# Patient Record
Sex: Female | Born: 1970
Health system: Southern US, Community
[De-identification: ages and names within clinical notes are randomized; demographics above are authoritative.]

## PROBLEM LIST (undated history)

## (undated) DIAGNOSIS — M1990 Unspecified osteoarthritis, unspecified site: Secondary | ICD-10-CM

## (undated) DIAGNOSIS — I214 Non-ST elevation (NSTEMI) myocardial infarction: Secondary | ICD-10-CM

## (undated) DIAGNOSIS — I10 Essential (primary) hypertension: Secondary | ICD-10-CM

## (undated) DIAGNOSIS — M797 Fibromyalgia: Secondary | ICD-10-CM

## (undated) DIAGNOSIS — M549 Dorsalgia, unspecified: Secondary | ICD-10-CM

## (undated) DIAGNOSIS — I2510 Atherosclerotic heart disease of native coronary artery without angina pectoris: Secondary | ICD-10-CM

## (undated) DIAGNOSIS — Z8489 Family history of other specified conditions: Secondary | ICD-10-CM

## (undated) DIAGNOSIS — F419 Anxiety disorder, unspecified: Secondary | ICD-10-CM

## (undated) DIAGNOSIS — M542 Cervicalgia: Secondary | ICD-10-CM

## (undated) HISTORY — DX: Non-ST elevation (NSTEMI) myocardial infarction: I21.4

## (undated) HISTORY — DX: Dorsalgia, unspecified: M54.9

## (undated) HISTORY — DX: Essential (primary) hypertension: I10

## (undated) HISTORY — PX: APPENDECTOMY: SHX54

## (undated) HISTORY — DX: Cervicalgia: M54.2

## (undated) HISTORY — PX: CHOLECYSTECTOMY: SHX55

## (undated) HISTORY — PX: TUBAL LIGATION: SHX77

## (undated) HISTORY — PX: ABDOMINAL HYSTERECTOMY: SHX81

## (undated) HISTORY — DX: Fibromyalgia: M79.7

## (undated) HISTORY — PX: BREAST SURGERY: SHX581

## (undated) HISTORY — DX: Atherosclerotic heart disease of native coronary artery without angina pectoris: I25.10

---

## 2001-05-07 ENCOUNTER — Ambulatory Visit (HOSPITAL_COMMUNITY): Admission: AD | Admit: 2001-05-07 | Discharge: 2001-05-07 | Payer: Self-pay | Admitting: Obstetrics and Gynecology

## 2001-05-20 ENCOUNTER — Inpatient Hospital Stay (HOSPITAL_COMMUNITY): Admission: AD | Admit: 2001-05-20 | Discharge: 2001-05-22 | Payer: Self-pay | Admitting: Obstetrics and Gynecology

## 2001-07-25 ENCOUNTER — Ambulatory Visit (HOSPITAL_COMMUNITY): Admission: RE | Admit: 2001-07-25 | Discharge: 2001-07-25 | Payer: Self-pay | Admitting: Obstetrics and Gynecology

## 2001-07-26 ENCOUNTER — Encounter: Payer: Self-pay | Admitting: Obstetrics and Gynecology

## 2001-07-26 ENCOUNTER — Inpatient Hospital Stay (HOSPITAL_COMMUNITY): Admission: AD | Admit: 2001-07-26 | Discharge: 2001-07-27 | Payer: Self-pay | Admitting: Obstetrics and Gynecology

## 2001-07-31 ENCOUNTER — Ambulatory Visit (HOSPITAL_COMMUNITY): Admission: RE | Admit: 2001-07-31 | Discharge: 2001-07-31 | Payer: Self-pay | Admitting: Obstetrics and Gynecology

## 2001-07-31 ENCOUNTER — Encounter: Payer: Self-pay | Admitting: Obstetrics and Gynecology

## 2001-10-22 ENCOUNTER — Encounter: Payer: Self-pay | Admitting: Internal Medicine

## 2001-10-22 ENCOUNTER — Inpatient Hospital Stay (HOSPITAL_COMMUNITY): Admission: EM | Admit: 2001-10-22 | Discharge: 2001-10-24 | Payer: Self-pay | Admitting: Emergency Medicine

## 2003-07-21 ENCOUNTER — Ambulatory Visit (HOSPITAL_COMMUNITY): Admission: RE | Admit: 2003-07-21 | Discharge: 2003-07-21 | Payer: Self-pay | Admitting: Internal Medicine

## 2003-08-04 ENCOUNTER — Ambulatory Visit (HOSPITAL_COMMUNITY): Admission: RE | Admit: 2003-08-04 | Discharge: 2003-08-04 | Payer: Self-pay | Admitting: Internal Medicine

## 2003-12-21 ENCOUNTER — Emergency Department (HOSPITAL_COMMUNITY): Admission: EM | Admit: 2003-12-21 | Discharge: 2003-12-22 | Payer: Self-pay | Admitting: Emergency Medicine

## 2004-03-22 ENCOUNTER — Ambulatory Visit (HOSPITAL_COMMUNITY): Admission: RE | Admit: 2004-03-22 | Discharge: 2004-03-22 | Payer: Self-pay | Admitting: Internal Medicine

## 2004-03-22 ENCOUNTER — Emergency Department (HOSPITAL_COMMUNITY): Admission: EM | Admit: 2004-03-22 | Discharge: 2004-03-22 | Payer: Self-pay | Admitting: Emergency Medicine

## 2004-05-10 ENCOUNTER — Ambulatory Visit: Payer: Self-pay | Admitting: Psychology

## 2004-10-12 ENCOUNTER — Ambulatory Visit (HOSPITAL_COMMUNITY): Admission: RE | Admit: 2004-10-12 | Discharge: 2004-10-12 | Payer: Self-pay | Admitting: Obstetrics & Gynecology

## 2004-10-14 ENCOUNTER — Emergency Department (HOSPITAL_COMMUNITY): Admission: EM | Admit: 2004-10-14 | Discharge: 2004-10-14 | Payer: Self-pay | Admitting: Emergency Medicine

## 2005-02-14 ENCOUNTER — Ambulatory Visit (HOSPITAL_COMMUNITY): Admission: RE | Admit: 2005-02-14 | Discharge: 2005-02-14 | Payer: Self-pay | Admitting: Orthopedic Surgery

## 2005-02-14 ENCOUNTER — Ambulatory Visit (HOSPITAL_BASED_OUTPATIENT_CLINIC_OR_DEPARTMENT_OTHER): Admission: RE | Admit: 2005-02-14 | Discharge: 2005-02-14 | Payer: Self-pay | Admitting: Orthopedic Surgery

## 2005-03-13 ENCOUNTER — Encounter (HOSPITAL_COMMUNITY): Admission: RE | Admit: 2005-03-13 | Discharge: 2005-04-12 | Payer: Self-pay | Admitting: Orthopedic Surgery

## 2006-01-21 ENCOUNTER — Emergency Department (HOSPITAL_COMMUNITY): Admission: EM | Admit: 2006-01-21 | Discharge: 2006-01-21 | Payer: Self-pay | Admitting: Emergency Medicine

## 2007-04-11 ENCOUNTER — Ambulatory Visit (HOSPITAL_COMMUNITY): Admission: RE | Admit: 2007-04-11 | Discharge: 2007-04-11 | Payer: Self-pay | Admitting: Obstetrics and Gynecology

## 2009-02-25 ENCOUNTER — Other Ambulatory Visit: Admission: RE | Admit: 2009-02-25 | Discharge: 2009-02-25 | Payer: Self-pay | Admitting: Obstetrics and Gynecology

## 2009-02-25 ENCOUNTER — Emergency Department (HOSPITAL_COMMUNITY): Admission: EM | Admit: 2009-02-25 | Discharge: 2009-02-25 | Payer: Self-pay | Admitting: Emergency Medicine

## 2009-06-01 ENCOUNTER — Ambulatory Visit (HOSPITAL_COMMUNITY): Admission: RE | Admit: 2009-06-01 | Discharge: 2009-06-01 | Payer: Self-pay | Admitting: Obstetrics and Gynecology

## 2009-07-20 ENCOUNTER — Inpatient Hospital Stay (HOSPITAL_COMMUNITY): Admission: RE | Admit: 2009-07-20 | Discharge: 2009-07-22 | Payer: Self-pay | Admitting: Obstetrics and Gynecology

## 2009-07-20 ENCOUNTER — Encounter: Payer: Self-pay | Admitting: Obstetrics and Gynecology

## 2009-10-22 ENCOUNTER — Encounter (HOSPITAL_COMMUNITY): Admission: RE | Admit: 2009-10-22 | Discharge: 2009-11-21 | Payer: Self-pay | Admitting: Internal Medicine

## 2010-01-04 ENCOUNTER — Ambulatory Visit (HOSPITAL_COMMUNITY): Admission: RE | Admit: 2010-01-04 | Discharge: 2010-01-04 | Payer: Self-pay | Admitting: Internal Medicine

## 2010-08-15 ENCOUNTER — Observation Stay (HOSPITAL_COMMUNITY)
Admission: EM | Admit: 2010-08-15 | Discharge: 2010-08-16 | Payer: Self-pay | Source: Home / Self Care | Attending: Internal Medicine | Admitting: Internal Medicine

## 2010-08-23 ENCOUNTER — Encounter (HOSPITAL_COMMUNITY)
Admission: RE | Admit: 2010-08-23 | Discharge: 2010-09-22 | Payer: Self-pay | Source: Home / Self Care | Attending: Internal Medicine | Admitting: Internal Medicine

## 2010-10-05 ENCOUNTER — Ambulatory Visit (INDEPENDENT_AMBULATORY_CARE_PROVIDER_SITE_OTHER): Payer: Self-pay | Admitting: Internal Medicine

## 2010-10-05 ENCOUNTER — Ambulatory Visit: Admit: 2010-10-05 | Payer: Self-pay | Admitting: Internal Medicine

## 2010-11-14 LAB — CARDIAC PANEL(CRET KIN+CKTOT+MB+TROPI)
CK, MB: 1 ng/mL (ref 0.3–4.0)
Relative Index: INVALID (ref 0.0–2.5)
Total CK: 25 U/L (ref 7–177)
Troponin I: 0.01 ng/mL (ref 0.00–0.06)

## 2010-11-14 LAB — BASIC METABOLIC PANEL
BUN: 5 mg/dL — ABNORMAL LOW (ref 6–23)
CO2: 25 mEq/L (ref 19–32)
Calcium: 9.3 mg/dL (ref 8.4–10.5)
Creatinine, Ser: 0.85 mg/dL (ref 0.4–1.2)
Glucose, Bld: 75 mg/dL (ref 70–99)

## 2010-11-14 LAB — LIPID PANEL
Triglycerides: 479 mg/dL — ABNORMAL HIGH (ref <150)
VLDL: UNDETERMINED mg/dL (ref 0–40)

## 2010-11-14 LAB — D-DIMER, QUANTITATIVE: D-Dimer, Quant: 0.22 ug/mL-FEU (ref 0.00–0.48)

## 2010-11-14 LAB — CBC
MCH: 31.8 pg (ref 26.0–34.0)
MCHC: 37.6 g/dL — ABNORMAL HIGH (ref 30.0–36.0)
Platelets: 237 10*3/uL (ref 150–400)

## 2010-11-14 LAB — POCT CARDIAC MARKERS
CKMB, poc: <1 ng/mL — ABNORMAL LOW (ref 1.0–8.0)
Troponin i, poc: <0.05 ng/mL (ref 0.00–0.09)
Troponin i, poc: <0.05 ng/mL (ref 0.00–0.09)

## 2010-12-07 LAB — COMPREHENSIVE METABOLIC PANEL
AST: 49 U/L — ABNORMAL HIGH (ref 0–37)
Albumin: 4 g/dL (ref 3.5–5.2)
Alkaline Phosphatase: 52 U/L (ref 39–117)
Chloride: 108 mEq/L (ref 96–112)
GFR calc Af Amer: >60 mL/min (ref >60)
Potassium: 3.5 mEq/L (ref 3.5–5.1)
Sodium: 140 mEq/L (ref 135–145)
Total Bilirubin: 0.7 mg/dL (ref 0.3–1.2)

## 2010-12-07 LAB — DIFFERENTIAL
Basophils Absolute: 0 10*3/uL (ref 0.0–0.1)
Basophils Relative: 0 % (ref 0–1)
Eosinophils Absolute: 0.1 10*3/uL (ref 0.0–0.7)
Eosinophils Relative: 1 % (ref 0–5)
Lymphocytes Relative: 24 % (ref 12–46)

## 2010-12-07 LAB — CBC
HCT: 38.7 % (ref 36.0–46.0)
HCT: 45 % (ref 36.0–46.0)
Platelets: 206 10*3/uL (ref 150–400)
Platelets: 236 10*3/uL (ref 150–400)
RDW: 12.4 % (ref 11.5–15.5)
WBC: 5.9 10*3/uL (ref 4.0–10.5)

## 2010-12-12 LAB — POCT CARDIAC MARKERS
CKMB, poc: <1 ng/mL — ABNORMAL LOW (ref 1.0–8.0)
Myoglobin, poc: 49.8 ng/mL (ref 12–200)

## 2010-12-12 LAB — BASIC METABOLIC PANEL
CO2: 24 mEq/L (ref 19–32)
Calcium: 8.9 mg/dL (ref 8.4–10.5)
Creatinine, Ser: 0.84 mg/dL (ref 0.4–1.2)
GFR calc Af Amer: >60 mL/min (ref >60)
GFR calc non Af Amer: >60 mL/min (ref >60)
Sodium: 137 mEq/L (ref 135–145)

## 2011-01-20 NOTE — Op Note (Signed)
Dominique Hinton, Dominique Hinton NO.:  [id number]   MEDICAL RECORD NO.:  [id number]          PATIENT TYPE:  AMB   LOCATION:  DSC                          FACILITY:  [facility]   PHYSICIAN:  Cindee Salt, M.D.       DATE OF BIRTH:  [birth year]   DATE OF PROCEDURE:  02/14/2005  DATE OF DISCHARGE:                                 OPERATIVE REPORT   PREOPERATIVE DIAGNOSIS:  Ulnar neuropathy, left elbow.   POSTOPERATIVE DIAGNOSIS:  Ulnar neuropathy, left elbow.   OPERATION:  Decompression of subcutaneous transposition left ulnar nerve.   SURGEON:  Cindee Salt, M.D.   ASSISTANT:  R.N.   ANESTHESIA:  General.   DATE OF OPERATION:  February 14, 2005.   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  EMG nerve conduction is positive which has not  responded to conservative treatment.  She is brought in for subcutaneous  transposition.   PROCEDURE:  The patient is brought to the operating room where a general  anesthetic was carried out without difficulty.  She was prepped using  DuraPrep in supine position, left arm free.  A linear incision was made over  the medial epicondyle and carried down through subcutaneous tissue. Bleeders  were electrocauterized.  The posterior branch of the medial brachial  cutaneous nerve of the forearm was identified.  The dissection carried down  after protecting this nerve to the [facility] fascia.  Anconeus epitrochlearis  was present.  Significant compression was present to the nerve.  The nerve  was identified proximally and distally, and with blunt and sharp dissection  this was freed.  Significant scarring was present.  Epineurolysis was  performed.  The medial intermuscular septum was then transected proximally,  left attached to the medial epicondyle at its most medial aspect.  The  fascia of the flexor carpi ulnaris was released distally.  No bands were  found within the muscle distally.  A portion of  the origin was then elevated  and left attached to the medial epicondyle medially.  The nerve was then  anteriorly transposed with its vessels.  The two slings were then fashioned  and sutured into position with figure-of-eight 4-0 Vicryl sutures after  irrigation.  Subcutaneous tissue was closed with interrupted 4-0 Vicryl, the  skin with subcuticular 4-0 Monocryl sutures.  Steri-Strips were applied.  Sterile compressive dressing and long arm splint applied.  The patient  tolerated the procedure well and taken to the recovery room for observation  in satisfactory condition.  She is discharged home to return to the [facility] in one week on Vicodin.     GK/MEDQ  D:  02/14/2005  T:  02/14/2005  Job:  [id number]

## 2011-01-20 NOTE — Op Note (Signed)
[facility]  Patient:    Dominique Hinton, Dominique Hinton Visit Number: [id number] MRN: [id number]          Service Type: OUT Location: RAD Attending Physician:  Tilda Burrow Dictated by:   Christin Bach, M.D. Proc. Date: 09/22/01 Admit Date:  07/31/2001 Discharge Date: 07/31/2001                             Operative Report  PREOPERATIVE DIAGNOSES:  Elective sterilization.  POSTOPERATIVE DIAGNOSES:  Elective sterilization.  PROCEDURE:  Laparoscopic tubal sterilization with Falope rings.  SURGEON:  Christin Bach, M.D.  ASSISTANT:  None.  COMPLICATIONS:  None.  ESTIMATED BLOOD LOSS:  Minimal.  FINDINGS:  Adhesions in right lower quadrant status post childhood appendectomy, left intact.  DETAILS OF PROCEDURE:  The patient was taken to the operating room, prepped and draped for a combined abdominal and vaginal procedure, with Hulka tenaculum attached to the cervix for uterine manipulation. Bladder in-and-out catheterization. An infraumbilical, 1 cm vertical incision, as well as a transverse suprapubic 1 cm incision. Veress needle was used to introduce pneumoperitoneum through the umbilical incision with the pneumoperitoneum easily introduced under 10 mmHg of pressure. Introduction of the Veress needle was done, carefully elevating the abdominal wall and orienting the needle toward the pelvis.  The laparoscopic trocar was then carefully introduced into the abdomen using a similar technique, and the laparoscope was used to visualize normal pelvic anatomy with no evidence of bleeding or trauma. The suprapubic trocar was introduced under direct visualization, and then attention was directed to the left fallopian tube, which was identified up to its fimbriated end, elevated and a mid-segment loop of the tube was drawn up into the Falope ring applier, Marcaine 0.25% applied to the surface of the tube and the Falope ring applied, inspected, and found to be  in satisfactory position. The opposite tube was then treated in a similar fashion. The mesosalpinx beneath the Falope ring on each side was then infiltrated with approximately 3 cc of Marcaine 0.25%, using a transabdominal approach with a 22-gauge spinal needle.  Then, the laparoscopic equipment was removed after instilling 200 cc of saline into the abdomen and deflating the abdomen. Subcuticular 4-0 Dexon was used to close the skin incisions and Steri-Strips was placed on the skin surface. Sponge and needle counts were correct. The patient tolerated the procedure well, was awakened, and went to the recovery room in good condition. Dictated by:   Christin Bach, M.D. Attending Physician:  Tilda Burrow DD:  09/22/01 TD:  09/23/01 Job: [id number]

## 2011-01-20 NOTE — Discharge Summary (Signed)
[facility]  Patient:    Dominique, Hinton Visit Number: [id number] MRN: [id number]          Service Type: MED Location: 3A A336 01 Attending Physician:  Hilario Quarry Dictated by:   Franky Macho, M.D. Admit Date:  10/22/2001 Disc. Date: 10/24/01   CC:         Carylon Perches, M.D.   Discharge Summary  HOSPITAL COURSE SUMMARY:  The patient is a 40 year old white female who presented to the emergency room with right-sided abdominal pain.  The patient was noted to have cholecystitis secondary to cholelithiasis. The patient was admitted by Dr. Ouida Sills with a surgical consultation.  The patient subsequently underwent laparoscopic cholecystectomy on October 23, 2001.  She tolerated the procedure well.  Her postoperative course was remarkable only for mild to moderate incisional pain.  The patient is being discharged home on postoperative day #1 in good and improving condition.  DISCHARGE INSTRUCTIONS:  The patient is to follow up with Dr. Franky Macho on October 31, 2001.  DISCHARGE MEDICATIONS:  Vicodin 1-2 tablets p.o. q.4h. p.r.n. pain, dispense 40, no refills.  PRINCIPLE DIAGNOSES:  Cholecystitis, cholelithiasis.  PROCEDURE:  Laparoscopic cholecystectomy on October 23, 2001. Dictated by:   Franky Macho, M.D. Attending Physician:  Hilario Quarry DD:  10/24/01 TD:  10/24/01 Job: [id number]

## 2011-01-20 NOTE — Discharge Summary (Signed)
[facility]  Patient:    Dominique Hinton, Dominique Hinton Visit Number: [id number] MRN: [id number]          Service Type: MED Location: 4A A427 01 Attending Physician:  Tilda Burrow Dictated by:   Christin Bach, M.D. Admit Date:  07/26/2001 Discharge Date: 07/27/2001                             Discharge Summary  REASON FOR ADMISSION:  Postoperative abdominal pain.  DISCHARGE DIAGNOSIS:  Postoperative abdominal pain improved.  DISCHARGE MEDICATIONS: 1. Keflex 500 mg p.o. q.i.d. x 5 days. 2. Darvocet-N 100 1-2 q.4h. p.r.n. pain (Patient has prescription already).  FOLLOWUP:  Follow up in 72 hours in my office.  HISTORY OF PRESENT ILLNESS:  This 40 year old female 30 hours status post laparoscope tubal ligation with Falope rings was admitted after presenting to the office complaining of abdominal pain not relieved with hydrocodone with a single episode of vomiting shortly after the surgery but none during the day of admission with admission simply because of concerns over abdominal discomfort.  Laparoscopic tubal ligation was notable for straight forward procedure. Falope ring application was performed in a straight forward fashion and percutaneous installation of Marcaine into the encarcerated nuchal tube performed as per usual protocol as well as into the broad ligament below the Falope ring tube improved.  POSTOPERATIVE PAIN MANAGEMENT:  The suprapubic placement of the trocar was not felt to have impacted the bladder, as in and out catheterization was performed prior to the procedure itself.  The procedure was notable for some right lower quadrant adhesions at site of prior appendectomy which were partially lysed during the surgical procedure.  There was no time that we were operating close enough to the bowel that we suspected bowel injury or trauma.  PAST MEDICAL HISTORY:  Benign.  PAST SURGICAL HISTORY: 1. Appendectomy as a child for ruptured  appendix. 2. Tubal ligation, Falope rings July 25, 2001, as described earlier.  ALLERGIES:  No known drug allergies.  PHYSICAL EXAMINATION:  VITAL SIGNS:  Height 5 feet 6 inches, weight 179 pounds, blood pressure 130/88, pulse 82, respirations 20, temperature 100.8.  GENERAL:  A generally healthy, usually cheerful, Caucasian female in moderate discomfort both in the abdomen and through to the back.  HEENT:  Pupils, equal, round, reactive to light and accommodation. Extraocular movements intact.  NECK:  Supple.  Trachea midline.  CHEST:  Clear to auscultation.  ABDOMEN:  Well approximated recent surgical scars with 2+ abdominal pain to deep palpation.  No rebound tenderness.  PELVIC:  Deferred at this time.  LABORATORY DATA:  On admission includes a hemoglobin of 12, hematocrit 35, white count 6,100 with 48 neutrophils, no bands.  Electrolytes are notable for potassium of 3.1, BUN 6, creatinine 0.9.  HOSPITAL COURSE:  The patient was admitted and flat and upright of the abdomen showed no evidence of intra-abdominal air.  There was a normal bowel gas pattern with generous amounts of colonic gas present.  The patient was observed and received IV Toradol q.6h. x 2 along with Cefotan 2 grams initially and then 1 gram in 12 hours.  She was observed overnight, and remained afebrile with subsequent follow up WBC 5,500, 53 neutrophils, no bands.  Potassium was reverted to normal at 4.0 after potassium supplementation overnight.  The patient felt significantly better.  She did complain at the time of discharge of a residual pain occurring through to the back  at the time of voiding but otherwise was dramatically better having had 3 bowel movements, passage of gas, and abdominal examination shows active bowel sounds dramatically improved, with no guarding or rebound present.  DISPOSITION/FOLLOWUP:  The patient was felt stable for discharge home.  Follow up in 72 hours in our  office.  DISCHARGE MEDICATIONS:  As noted earlier. Dictated by:   Christin Bach, M.D. Attending Physician:  Tilda Burrow DD:  07/27/01 TD:  07/28/01 Job: [id number]

## 2011-01-20 NOTE — H&P (Signed)
[facility]  Patient:    Dominique Hinton, Dominique Hinton Visit Number: [id number] MRN: [id number]          Service Type: MED Location: 3A A336 01 Attending Physician:  Hilario Quarry Dictated by:   Carylon Perches, M.D. Admit Date:  10/22/2001                           History and Physical  CHIEF COMPLAINT:  Abdominal pain.  HISTORY OF PRESENT ILLNESS:  This patient is a 40 year old white female who presented to the office with a one day history of right-sided abdominal pain radiating into her back.  She had previously had an appendectomy and tubal ligation.  Her pain began last night and was quite severe.  She took hydrocodone without relief.  Her pain was not colicky in nature.  She did not experience any gross hematuria, vaginal bleeding, or diarrhea.  She was sent to the emergency room for further evaluation and treatment.  A urinalysis in the office was negative for blood.  Her CT scan in the ER revealed gallstones. Her LFTs and white count were normal.  PAST MEDICAL HISTORY: 1. Appendectomy. 2. Tubal ligation. 3. Left breast abscess. 4. Pyelonephritis.  MEDICATIONS:  None.  ALLERGIES:  None.  SOCIAL HISTORY:  She is a smoker.  She does not abuse alcohol.  She is presently unemployed.  FAMILY HISTORY:  Remarkable for hypertension.  REVIEW OF SYSTEMS:  No fever, chills, chest pain, vomiting, change in bowel habits, or difficulty voiding.  PHYSICAL EXAMINATION  VITAL SIGNS:  Temperature 98.6, pulse 84, respirations 24, blood pressure 114/104.  GENERAL:  Uncomfortable young white female.  HEENT:  No scleral icterus.  Pharynx:  Unremarkable.  NECK:  No thyromegaly.  LUNGS:  Clear.  HEART:  Regular with no murmurs.  ABDOMEN:  Tender in the right mid abdomen and right flank.  No hepatosplenomegaly or palpable mass.  EXTREMITIES:  No clubbing, cyanosis, edema.  NEUROLOGIC:  Grossly intact.  LABORATORY DATA:  Urinalysis is negative.  Beta hCG  less than 2.  White count 9.8, hematocrit 49, platelets 329,000.  Amylase 51.  Sodium 137, potassium 3.7, bicarbonate 24, glucose 105, BUN 7, creatinine 1.0, calcium 9.0, albumin 4.4, ALT 31.  CT scan of the abdomen reveals calcified gallstones and two tiny right renal calculi.  There is no hydronephrosis or ureteral dilatation.  IMPRESSION:  Cholecystitis.  She is being hospitalized for pain control, surgical consultation, and intravenous antibiotics.  She will likely require cholecystectomy.  This has been discussed with the patient and her family. Dictated by:   Carylon Perches, M.D. Attending Physician:  Hilario Quarry DD:  10/22/01 TD:  10/22/01 Job: [id number]

## 2011-01-20 NOTE — Op Note (Signed)
[facility]  Patient:    Dominique Hinton, Dominique Hinton Visit Number: [id number] MRN: [id number]          Service Type: MED Location: 3A A336 01 Attending Physician:  Hilario Quarry Dictated by:   Franky Macho, M.D. Proc. Date: 10/23/01 Admit Date:  10/22/2001   CC:         Carylon Perches, M.D.                           Operative Report  PREOPERATIVE DIAGNOSIS:  Cholecystitis, cholelithiasis.  POSTOPERATIVE DIAGNOSIS:  Cholecystitis, cholelithiasis.  PROCEDURE:  Laparoscopic cholecystectomy.  SURGEON:  Franky Macho, M.D.  ANESTHESIA:  General endotracheal anesthesia.  INDICATIONS:  The patient is a 40 year old white female who presents with cholecystitis secondary to cholelithiasis.  The risks and benefits of the procedure including bleeding, infection, hepatobiliary injury, and the possibility of a open procedure were fully explained to the patient, who gave informed consent.  DESCRIPTION OF PROCEDURE:  The patient was placed in the supine position. After induction of general endotracheal anesthesia, the abdomen was prepped and draped using the usual sterile technique with Betadine.  A subumbilical incision was made down to the fascia.  A Veress needle was introduced into the abdominal cavity and confirmation of placement was done using the saline drop test.  The abdomen was then insufflated to 16 mmHg pressure.  An 11 mm trocar was introduced into the abdominal cavity under direct visualization without difficulty.  The patient was placed in reverse Trendelenburg position and an additional 11 mm trocar was placed in the epigastric region and 5 mm trocar was placed in the right upper quadrant and right flank regions under direct visualization.  The liver was inspected and noted to be within normal limits.  The gallbladder was noted to be somewhat inflamed with adhesions noted around the infundibulum.  The cystic duct was first identified.  Its juncture to  the infundibulum fully identified and the clips were placed proximally and distally on the cystic duct and the cystic duct was divided.  This was likewise done with the cystic artery.  The gallbladder was then freed away from the gallbladder fossa using Bovie electrocautery.  The gallbladder was delivered through the epigastric trocar site using an endocatch bag.  Multiple stones were noted within the gallbladder lumen.  Gallbladder fossa was inspected and no abdominal bleeding or bile leakage was noted.  Surgicel was placed in the gallbladder fossa. Subhepatic space as well as right hepatic gutter were irrigated with normal saline.  All fluid was then evacuated from the abdominal cavity prior to removal of the trocars.  All wounds were irrigated with normal saline.  All wounds were injected with 0.5% Marcaine.  The subumbilical fascia as well as the epigastric fascia was reapproximated using an 0 Vicryl interrupted suture.  All skin incisions were closed using staples.  Betadine ointment and dry sterile dressings were applied.  All tape and needle counts correct at the end of the procedure.  The patient was extubated in the operating room and went back to the recovery room awake, in stable condition.  Complications - none.  Specimens were gallbladder with stones.  Blood loss was minimal. Dictated by:   Franky Macho, M.D. Attending Physician:  Hilario Quarry DD:  10/23/01 TD:  10/23/01 Job: [id number]

## 2011-01-20 NOTE — Consult Note (Signed)
[facility]  Patient:    Dominique Hinton, Dominique Hinton Visit Number: [id number] MRN: [id number]          Service Type: MED Location: 3A A336 01 Attending Physician:  Hilario Quarry Dictated by:   Franky Macho, M.D. Proc. Date: 10/22/01 Admit Date:  10/22/2001   CC:         Carylon Perches, M.D.   Consultation Report  REASON FOR CONSULTATION:  Cholecystitis, cholelithiasis.  REFERRING PHYSICIAN:  Carylon Perches, M.D.  HISTORY OF PRESENT ILLNESS:  Patient is a 40 year old white female who presents with a 24 hour history of worsening right flank and back pain.  She also complains of bloating, belching, and decreased appetite.  She denies any fever, chills, history of peptic ulcer disease, diarrhea, or constipation. Her last normal bowel movement was this morning.  She denies any hematochezia. She denies any jaundice.  PAST MEDICAL HISTORY:  Remarkable for urinary tract infection in the past.  PAST SURGICAL HISTORY:  Appendectomy in the remote past.  CURRENT MEDICATIONS:  Toradol, morphine.  ALLERGIES:  No known drug allergies.  REVIEW OF SYSTEMS:  Unremarkable.  PHYSICAL EXAMINATION  GENERAL:  Patient is a pleasant 40 year old white female who is lying on the cart in minimal distress.  VITAL SIGNS:  She is afebrile and vital signs are stable.  HEENT:  No scleral icterus.  LUNGS:  Clear to auscultation with equal breath sounds bilaterally.  HEART:  Regular rate and rhythm without S3, S4, or murmurs.  ABDOMEN:  Soft with tenderness noted along the right side of the abdomen, flank, and right back region.  No hepatosplenomegaly, masses, or hernias are identified.  RECTAL:  Not performed.  LABORATORIES:  CBC, chem-12, amylase, lipase are all within normal limits. Urinalysis is negative for hematuria.  CT scan of the abdomen and pelvis was performed which revealed calcified gallstones.  Two tiny right renal calculi are noted without ureteral dilatation or  hydronephrosis.  IMPRESSION:  Atypical cholecystitis, cholelithiasis.  PLAN:  Even though her symptoms are atypical for biliary colic, there is no other etiology for her symptoms.  Thus, one has to assume that this is due to cholelithiasis.  The risks and benefits of a laparoscopic cholecystectomy including bleeding, infection, hepatobiliary injury, the possibility of an open procedure were fully explained to the patient, gave informed consent. Patient will be brought into the hospital and the surgery will be performed tomorrow.  Thank you for consulting me on Dominique Hinton. Dictated by:   Franky Macho, M.D. Attending Physician:  Hilario Quarry DD:  10/22/01 TD:  10/22/01 Job: [id number]

## 2011-02-09 ENCOUNTER — Emergency Department (HOSPITAL_COMMUNITY)
Admission: EM | Admit: 2011-02-09 | Discharge: 2011-02-09 | Disposition: A | Discharge status: Home / Self Care | Payer: Self-pay | Attending: Emergency Medicine | Admitting: Emergency Medicine

## 2011-02-09 DIAGNOSIS — E7800 Pure hypercholesterolemia, unspecified: Secondary | ICD-10-CM | POA: Insufficient documentation

## 2011-02-09 DIAGNOSIS — R51 Headache: Secondary | ICD-10-CM | POA: Insufficient documentation

## 2011-06-17 ENCOUNTER — Emergency Department (HOSPITAL_COMMUNITY): Payer: Medicaid Other

## 2011-06-17 ENCOUNTER — Emergency Department (HOSPITAL_COMMUNITY)
Admission: EM | Admit: 2011-06-17 | Discharge: 2011-06-17 | Disposition: A | Discharge status: Home / Self Care | Payer: Medicaid Other | Attending: Emergency Medicine | Admitting: Emergency Medicine

## 2011-06-17 ENCOUNTER — Encounter: Payer: Self-pay | Admitting: *Deleted

## 2011-06-17 DIAGNOSIS — F17200 Nicotine dependence, unspecified, uncomplicated: Secondary | ICD-10-CM | POA: Insufficient documentation

## 2011-06-17 DIAGNOSIS — S42009A Fracture of unspecified part of unspecified clavicle, initial encounter for closed fracture: Secondary | ICD-10-CM | POA: Insufficient documentation

## 2011-06-17 DIAGNOSIS — W108XXA Fall (on) (from) other stairs and steps, initial encounter: Secondary | ICD-10-CM | POA: Insufficient documentation

## 2011-06-17 MED ORDER — OXYCODONE-ACETAMINOPHEN 5-325 MG PO TABS
2.0000 | ORAL_TABLET | Freq: Once | ORAL | Status: AC
  Administered 2011-06-17: 2 via ORAL
  Filled 2011-06-17: qty 2

## 2011-06-17 MED ORDER — HYDROMORPHONE HCL 2 MG PO TABS: 2.0000 mg | ORAL_TABLET | ORAL | Status: AC | PRN

## 2011-06-17 NOTE — ED Notes (Signed)
Pulses palp and sensory good in left hand. Pt continues to have pain after sling placed.

## 2011-06-17 NOTE — ED Provider Notes (Signed)
History     CSN: [id number] Arrival date & time: 06/17/2011 12:59 PM  Chief Complaint  Patient presents with  . Shoulder Pain   HPI Dominique Hinton is a 40 y.o. female who presents to the ED for left shoulder pain. She was going down steps and slipped and fell with all her weight going on the left shoulder. The pain is 10/10. The injury occurred just prior to arrival to the ED. The pain increases with any movement of the left upper extremity. The history was provided by the patient.   History reviewed. No pertinent past medical history.  Past Surgical History  Procedure Date  . Abdominal hysterectomy   . Tubal ligation   . Appendectomy   . Cholecystectomy   . Breast surgery     History reviewed. No pertinent family history.  History  Substance Use Topics  . Smoking status: Current Everyday Smoker -- 1.0 packs/day    Types: Cigarettes  . Smokeless tobacco: Not on file  . Alcohol Use: Yes     occasionally    OB History    Grav Para Term Preterm Abortions TAB SAB Ect Mult Living                  Review of Systems  Musculoskeletal:       Shoulder and clavicle pain.  All other systems reviewed and are negative.    Allergies  Cortisone and Doxycycline  Home Medications  No current outpatient prescriptions on file.  BP 140/86  Pulse 85  Temp(Src) 98.5 F (36.9 C) (Oral)  Resp 22  Ht 5\' 6"  (1.676 m)  Wt 185 lb (83.915 kg)  BMI 29.86 kg/m2  SpO2 98%  Physical Exam  Nursing note and vitals reviewed. Constitutional: She is oriented to person, place, and time. She appears well-developed and well-nourished.  HENT:  Head: Normocephalic and atraumatic.  Eyes: EOM are normal.  Neck: Normal range of motion. Neck supple.  Cardiovascular: Normal rate.   Pulmonary/Chest: Effort normal.  Musculoskeletal:       Tenderness of left shoulder on palpation, decreased range of motion due to pain. Deformity over left clavicle, tender with palpation. Radial pulse present,  no vascular compromise. Medial, ulnar and radial nerves intact. No decreased sensation.  Neurological: She is alert and oriented to person, place, and time. No cranial nerve deficit.  Skin: Skin is warm and dry.  Psychiatric: She has a normal mood and affect. Judgment normal.    ED Course  Procedures  Labs Reviewed - No data to display Dg Shoulder Left  06/17/2011  *RADIOLOGY REPORT*  Clinical Data: Fall.  Left shoulder pain.  LEFT SHOULDER - 2+ VIEW  Comparison: None.  Findings: Three-view exam shows a comminuted fracture of the mid clavicle.  There is a small fragment which is anteriorly displaced relative to the clavicle.  No evidence for shoulder separation or dislocation.  IMPRESSION: Comminuted mid clavicle fracture.  Original Report Authenticated By: ERIC A. MANSELL, M.D.    Dr. Fonnie Jarvis in to examine patient and patient sent back for clavicle films.  Assessment: Fractured clavicle comminuted    Plan:  Sling, ice and pain management    Follow up with ortho for possible surgical correction   MDM          Dominique Buffalo, NP 06/17/11 [id number]

## 2011-06-17 NOTE — ED Notes (Signed)
Pt a/ox4. Resp even and unlabored. NAD at this time. D/C instructions and Rx x 1 reviewed with pt. Pt verbalized understanding. Pt ambulated with steady gate to POV. Family to transport pt home.

## 2011-06-17 NOTE — ED Provider Notes (Signed)
The patient has normal light touch over the left deltoid region with normal sensory in the axillary nerve distribution, her left mid clavicle is tender without tinting of the skin, her distal neurovascular status is intact to her left hand with normal light touch and intact motor strength in the distributions of the radial, median, and ulnar nerve function to the left hand with capillary refill less than 2 seconds in all digits in her left hand. Her left hand wrist and elbow are all nontender in her left proximal humerus is also nontender his left clavicle tenderness only.  Hurman Horn, MD 06/17/11 [phone]

## 2011-06-17 NOTE — ED Notes (Signed)
Pt fell and landed on her left shoulder. States that she heard a pop. Able to wiggle fingers but states that she is unable to move her arm due to pain.

## 2011-06-22 NOTE — ED Provider Notes (Signed)
Medical screening examination/treatment/procedure(s) were performed by non-physician practitioner and as supervising physician I was immediately available for consultation/collaboration. Devoria Albe, MD, Armando Gang   Ward Givens, MD 06/22/11 [phone]

## 2012-10-20 ENCOUNTER — Emergency Department (HOSPITAL_COMMUNITY)
Admission: EM | Admit: 2012-10-20 | Discharge: 2012-10-20 | Disposition: A | Discharge status: Home / Self Care | Payer: Self-pay | Attending: Emergency Medicine | Admitting: Emergency Medicine

## 2012-10-20 ENCOUNTER — Encounter (HOSPITAL_COMMUNITY): Payer: Self-pay | Admitting: *Deleted

## 2012-10-20 DIAGNOSIS — F17200 Nicotine dependence, unspecified, uncomplicated: Secondary | ICD-10-CM | POA: Insufficient documentation

## 2012-10-20 DIAGNOSIS — J019 Acute sinusitis, unspecified: Secondary | ICD-10-CM | POA: Insufficient documentation

## 2012-10-20 DIAGNOSIS — J40 Bronchitis, not specified as acute or chronic: Secondary | ICD-10-CM | POA: Insufficient documentation

## 2012-10-20 DIAGNOSIS — H9201 Otalgia, right ear: Secondary | ICD-10-CM

## 2012-10-20 DIAGNOSIS — M542 Cervicalgia: Secondary | ICD-10-CM | POA: Insufficient documentation

## 2012-10-20 DIAGNOSIS — J329 Chronic sinusitis, unspecified: Secondary | ICD-10-CM

## 2012-10-20 DIAGNOSIS — R05 Cough: Secondary | ICD-10-CM | POA: Insufficient documentation

## 2012-10-20 DIAGNOSIS — R059 Cough, unspecified: Secondary | ICD-10-CM | POA: Insufficient documentation

## 2012-10-20 MED ORDER — ALBUTEROL SULFATE HFA 108 (90 BASE) MCG/ACT IN AERS
2.0000 | INHALATION_SPRAY | RESPIRATORY_TRACT | Status: DC | PRN
  Administered 2012-10-20: 2 via RESPIRATORY_TRACT
  Filled 2012-10-20: qty 6.7

## 2012-10-20 MED ORDER — ANTIPYRINE-BENZOCAINE 5.4-1.4 % OT SOLN: 3.0000 [drp] | OTIC | Status: DC | PRN

## 2012-10-20 MED ORDER — AMOXICILLIN 500 MG PO CAPS: 500.0000 mg | ORAL_CAPSULE | Freq: Three times a day (TID) | ORAL | Status: DC

## 2012-10-20 NOTE — ED Provider Notes (Signed)
History    This chart was scribed for Ward Givens, MD by Melba Coon, ED Scribe. The patient was seen in room APA11/APA11 and the patient's care was started at 8:56AM.    CSN: [id number]  Arrival date & time 10/20/12  2130   First MD Initiated Contact with Patient 10/20/12 [phone]      Chief Complaint  Patient presents with  . Otalgia  . Neck Pain  . Cough    (Consider location/radiation/quality/duration/timing/severity/associated sxs/prior treatment) The history is provided by the patient. No language interpreter was used.   Dominique Hinton is a 42 y.o. female who presents to the Emergency Department complaining of constant, moderate right otalgia without drainage that radiates to her right neck with associated nasal congestion and productive cough with an onset 5 days ago. She reports that she has had otalgia since around 08/28/12 but has gotten progressively worse since 5 days ago. She reports her ear feels like "it has cotton in it". Her cough produces whitish/yellow phlegm. She reports rhinorrhea that is clear when it is running but yellow when she has to blow her nose. She reports mild SOB and wheezing that is worse when she inhales more than she exhales since last month that is alleviated by her boyfriend's inhaler. She denies past history of asthma. She reports feeling hot alternating with heat intolerance yesterday, but denies documented fever. Denies rash, back pain, CP, abdominal pain, nausea, emesis, diarrhea, dysuria, or extremity pain, edema, weakness, numbness, or tingling. Allergic to doxycycline and cortisone. She takes Xanax and Maxalt as needed. No other pertinent medical symptoms. She is a current everyday smoker.  Her right ear and neck hurt more when she does a sniffing maneuver. She denies drainage from her ear and states she started having pain in her right ear 5 days ago with "cotton" in her right ear since Christmas.  PCP: Dr Ouida Sills  History reviewed. No  pertinent past medical history.  Past Surgical History  Procedure Laterality Date  . Abdominal hysterectomy    . Tubal ligation    . Appendectomy    . Cholecystectomy    . Breast surgery      No family history on file.  History  Substance Use Topics  . Smoking status: Current Every Day Smoker -- 1.00 packs/day    Types: Cigarettes  . Smokeless tobacco: Not on file  . 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  OB History   Grav Para Term Preterm Abortions TAB SAB Ect Mult Living                  Review of Systems  Constitutional: Positive for chills. Negative for fever.  HENT: Positive for ear pain, congestion, rhinorrhea and neck pain.   Respiratory: Positive for cough and shortness of breath.   Gastrointestinal: Negative for nausea, vomiting and diarrhea.   10 Systems reviewed and all are negative for acute change except as noted in the HPI.   Allergies  Cortisone and Doxycycline  Home Medications   Current Outpatient Rx  Name  Route  Sig  Dispense  Refill  . ALPRAZolam (XANAX) 0.5 MG tablet   Oral   Take 0.5 mg by mouth daily as needed. For stress. May take only a couple times per week            BP 138/93  Pulse 68  Temp(Src) 98.8 F (37.1 C) (Oral)  Resp 16  Ht 5\' 6"  (1.676 m)  Wt 185 lb (83.915 kg)  BMI 29.87 kg/m2  SpO2 95%  Vital signs normal    Physical Exam  Nursing note and vitals reviewed. Constitutional: She is oriented to person, place, and time. She appears well-developed and well-nourished.  Non-toxic appearance. She does not appear ill. No distress.  HENT:  Head: Normocephalic and atraumatic.  Right Ear: Tympanic membrane, external ear and ear canal normal.  Left Ear: Tympanic membrane, external ear and ear canal normal.  Nose: Mucosal edema present. No rhinorrhea. Right sinus exhibits maxillary sinus tenderness and frontal sinus tenderness.  Mouth/Throat: Oropharynx is clear and moist  and mucous membranes are normal. No dental abscesses or edematous.  Right nasal mucosa is boggy; left side is normal. Bilateral TMs normal.  Eyes: Conjunctivae and EOM are normal. Pupils are equal, round, and reactive to light.  Neck: Normal range of motion and full passive range of motion without pain. Neck supple.  Cardiovascular: Normal rate, regular rhythm and normal heart sounds.  Exam reveals no gallop and no friction rub.   No murmur heard. Pulmonary/Chest: Effort normal. No respiratory distress. She has no wheezes. She has rhonchi. She has no rales. She exhibits no tenderness and no crepitus.  Breath sounds coarse. Rare rhonchi.   Abdominal: Soft. Normal appearance and bowel sounds are normal. She exhibits no distension. There is no tenderness. There is no rebound and no guarding.  Musculoskeletal: Normal range of motion. She exhibits no edema and no tenderness.  Moves all extremities well.   Lymphadenopathy:    She has no cervical adenopathy.  Neurological: She is alert and oriented to person, place, and time. She has normal strength. No cranial nerve deficit.  Skin: Skin is warm, dry and intact. No rash noted. No erythema. No pallor.  Psychiatric: She has a normal mood and affect. Her speech is normal and behavior is normal. Her mood appears not anxious.    ED Course  Procedures (including critical care time)  Medications  albuterol (PROVENTIL HFA;VENTOLIN HFA) 108 (90 BASE) MCG/ACT inhaler 2 puff (not administered)     DIAGNOSTIC STUDIES: Oxygen Saturation is 95% on room air, adequate by my interpretation.    COORDINATION OF CARE:  9:01AM - albuterol inhlaer will be ordered for Zada Finders. 9:20PM -. She will be Rx amoxil and auralgan and she is ready for d/c.    1. Sinusitis   2. Bronchitis   3. Ear ache, right    New Prescriptions   AMOXICILLIN (AMOXIL) 500 MG CAPSULE    Take 1 capsule (500 mg total) by mouth 3 (three) times daily.   ANTIPYRINE-BENZOCAINE  (AURALGAN) OTIC SOLUTION    Place 3 drops into the right ear every 2 (two) hours as needed for pain.    Plan discharge  Devoria Albe, MD, FACEP    MDM   I personally performed the services described in this documentation, which was scribed in my presence. The recorded information has been reviewed and considered.  Devoria Albe, MD, FACEP       Ward Givens, MD 10/20/12 [phone]

## 2012-10-20 NOTE — ED Notes (Signed)
Discharge instructions reviewed with pt, questions answered. Pt verbalized understanding.  

## 2012-10-20 NOTE — ED Notes (Signed)
Right ear pain, described as feeling stopped up, since before christmas. Right neck pain, facial pain, productive cough, yellow in color for ~ 1 mo. Pt states she feels like she has an ear infection and sinus infection

## 2014-10-26 ENCOUNTER — Encounter (HOSPITAL_COMMUNITY): Payer: Self-pay | Admitting: Emergency Medicine

## 2014-10-26 ENCOUNTER — Emergency Department (HOSPITAL_COMMUNITY): Payer: Medicaid Other

## 2014-10-26 ENCOUNTER — Emergency Department (HOSPITAL_COMMUNITY)
Admission: EM | Admit: 2014-10-26 | Discharge: 2014-10-26 | Disposition: A | Discharge status: Home / Self Care | Payer: Medicaid Other | Attending: Emergency Medicine | Admitting: Emergency Medicine

## 2014-10-26 DIAGNOSIS — Z3202 Encounter for pregnancy test, result negative: Secondary | ICD-10-CM | POA: Insufficient documentation

## 2014-10-26 DIAGNOSIS — Z720 Tobacco use: Secondary | ICD-10-CM | POA: Insufficient documentation

## 2014-10-26 DIAGNOSIS — R110 Nausea: Secondary | ICD-10-CM | POA: Insufficient documentation

## 2014-10-26 DIAGNOSIS — F419 Anxiety disorder, unspecified: Secondary | ICD-10-CM | POA: Insufficient documentation

## 2014-10-26 DIAGNOSIS — R000 Tachycardia, unspecified: Secondary | ICD-10-CM | POA: Insufficient documentation

## 2014-10-26 DIAGNOSIS — R079 Chest pain, unspecified: Secondary | ICD-10-CM

## 2014-10-26 DIAGNOSIS — R062 Wheezing: Secondary | ICD-10-CM | POA: Insufficient documentation

## 2014-10-26 DIAGNOSIS — R0602 Shortness of breath: Secondary | ICD-10-CM | POA: Insufficient documentation

## 2014-10-26 DIAGNOSIS — Z79899 Other long term (current) drug therapy: Secondary | ICD-10-CM | POA: Insufficient documentation

## 2014-10-26 LAB — CBC
HCT: 48.4 % — ABNORMAL HIGH (ref 36.0–46.0)
Hemoglobin: 17.7 g/dL — ABNORMAL HIGH (ref 12.0–15.0)
MCH: 31.5 pg (ref 26.0–34.0)
MCHC: 36.6 g/dL — ABNORMAL HIGH (ref 30.0–36.0)
MCV: 86.1 fL (ref 78.0–100.0)
PLATELETS: 252 10*3/uL (ref 150–400)
RBC: 5.62 MIL/uL — ABNORMAL HIGH (ref 3.87–5.11)
RDW: 12.6 % (ref 11.5–15.5)
WBC: 8.9 10*3/uL (ref 4.0–10.5)

## 2014-10-26 LAB — URINALYSIS, ROUTINE W REFLEX MICROSCOPIC
Bilirubin Urine: NEGATIVE
Glucose, UA: NEGATIVE mg/dL
Ketones, ur: NEGATIVE mg/dL
Leukocytes, UA: NEGATIVE
Nitrite: NEGATIVE
PH: 6 (ref 5.0–8.0)
Protein, ur: NEGATIVE mg/dL
Specific Gravity, Urine: <1.005 — ABNORMAL LOW (ref 1.005–1.030)
UROBILINOGEN UA: 0.2 mg/dL (ref 0.0–1.0)

## 2014-10-26 LAB — PREGNANCY, URINE: Preg Test, Ur: NEGATIVE

## 2014-10-26 LAB — BASIC METABOLIC PANEL
Anion gap: 9 (ref 5–15)
BUN: 10 mg/dL (ref 6–23)
CO2: 20 mmol/L (ref 19–32)
Calcium: 10 mg/dL (ref 8.4–10.5)
Chloride: 111 mmol/L (ref 96–112)
Creatinine, Ser: 0.93 mg/dL (ref 0.50–1.10)
GFR calc Af Amer: 86 mL/min — ABNORMAL LOW (ref >90)
GFR, EST NON AFRICAN AMERICAN: 74 mL/min — AB (ref >90)
Glucose, Bld: 108 mg/dL — ABNORMAL HIGH (ref 70–99)
POTASSIUM: 3.4 mmol/L — AB (ref 3.5–5.1)
SODIUM: 140 mmol/L (ref 135–145)

## 2014-10-26 LAB — URINE MICROSCOPIC-ADD ON

## 2014-10-26 LAB — TROPONIN I

## 2014-10-26 LAB — D-DIMER, QUANTITATIVE (NOT AT ARMC)

## 2014-10-26 MED ORDER — NAPROXEN 500 MG PO TABS: 500.0000 mg | ORAL_TABLET | Freq: Two times a day (BID) | ORAL | Status: DC

## 2014-10-26 MED ORDER — SODIUM CHLORIDE 0.9 % IV BOLUS (SEPSIS)
1000.0000 mL | Freq: Once | INTRAVENOUS | Status: AC
  Administered 2014-10-26: 1000 mL via INTRAVENOUS

## 2014-10-26 MED ORDER — ONDANSETRON HCL 4 MG PO TABS: 4.0000 mg | ORAL_TABLET | Freq: Three times a day (TID) | ORAL | Status: DC | PRN

## 2014-10-26 MED ORDER — LORAZEPAM 2 MG/ML IJ SOLN
1.0000 mg | Freq: Once | INTRAMUSCULAR | Status: AC
  Administered 2014-10-26: 1 mg via INTRAVENOUS
  Filled 2014-10-26: qty 1

## 2014-10-26 MED ORDER — ALBUTEROL SULFATE (2.5 MG/3ML) 0.083% IN NEBU
5.0000 mg | INHALATION_SOLUTION | Freq: Once | RESPIRATORY_TRACT | Status: AC
  Administered 2014-10-26: 5 mg via RESPIRATORY_TRACT
  Filled 2014-10-26: qty 6

## 2014-10-26 NOTE — ED Provider Notes (Signed)
CSN: [id number]     Arrival date & time 10/26/14  6767 History  This chart was scribed for Dominique Acosta, MD by Stephania Fragmin, ED Scribe. This patient was seen in room APA02/APA02 and the patient's care was started at 9:31 AM.    Chief Complaint  Patient presents with  . Chest Pain   The history is provided by the patient. No language interpreter was used.     HPI Comments:  Dominique Hinton is a 44 y.o. female who presents to the Emergency Department complaining of SOB and small amount of central chest pain that both acutely began 1.5 hours ago. She reports she has been awake since 2:30 AM last night due to nausea. Her symptoms are severe and persistent; nothing makes them better or worse. She is breathing rapidly and states, "I don't know if I'm having a panic attack." She reports she has been having head congestion recently for which she has been taking OTC sinus medication (Advil Cold and Sinus), last taken yesterday morning. She has taken nothing else since. She denies new steess, medications, traveling, injuries, or surgeries. She denies a history of pulmonary or cardiac problems, including CVA. She denies dysuria, frequency, urine discoloration. Patient states she had vodka and 8 jello shooters at 6 pm last night while watching a race. She also smokes marijuana. She denies cocaine use.   No PE risk factors. Smoking is the only ACS risk factor.   History reviewed. No pertinent past medical history. Past Surgical History  Procedure Laterality Date  . Abdominal hysterectomy    . Tubal ligation    . Appendectomy    . Cholecystectomy    . Breast surgery     Family History  Problem Relation Age of Onset  . Diabetes Mother   . Alzheimer's disease Father   . Hypertension Other    History  Substance Use Topics  . Smoking status: Current Every Day Smoker -- 1.00 packs/day    Types: Cigarettes  . Smokeless tobacco: Not on file  . Alcohol Use: Yes     Comment: occasionally   OB  History    Gravida Para Term Preterm AB TAB SAB Ectopic Multiple Living   4 3 3  1  1         Review of Systems  Respiratory: Positive for shortness of breath.   Cardiovascular: Positive for chest pain.  Genitourinary: Negative for dysuria and frequency.  Psychiatric/Behavioral: The patient is nervous/anxious.      Allergies  Cortisone and Doxycycline  Home Medications   Prior to Admission medications   Medication Sig Start Date End Date Taking? Authorizing Provider  amoxicillin (AMOXIL) 500 MG capsule Take 1 capsule (500 mg total) by mouth 3 (three) times daily. Patient not taking: Reported on 10/26/2014 10/20/12   Janice Norrie, MD  antipyrine-benzocaine Toniann Fail) otic solution Place 3 drops into the right ear every 2 (two) hours as needed for pain. Patient not taking: Reported on 10/26/2014 10/20/12   Janice Norrie, MD  naproxen (NAPROSYN) 500 MG tablet Take 1 tablet (500 mg total) by mouth 2 (two) times daily with a meal. 10/26/14   Dominique Acosta, MD  ondansetron (ZOFRAN) 4 MG tablet Take 1 tablet (4 mg total) by mouth every 8 (eight) hours as needed for nausea or vomiting. 10/26/14   Dominique Acosta, MD   BP 162/98 mmHg  Pulse 87  Temp(Src) 98.9 F (37.2 C) (Oral)  Resp 22  Ht 5\' 6"  (1.676 m)  Wt 170 lb (77.111 kg)  BMI 27.45 kg/m2  SpO2 98% Physical Exam  Constitutional: She appears well-developed and well-nourished.  Anxious-appearing.  HENT:  Head: Normocephalic and atraumatic.  Mouth/Throat: Oropharynx is clear and moist. No oropharyngeal exudate.  Eyes: Conjunctivae and EOM are normal. Pupils are equal, round, and reactive to light. Right eye exhibits no discharge. Left eye exhibits no discharge. No scleral icterus.  Neck: Normal range of motion. Neck supple. No JVD present. No thyromegaly present.  Cardiovascular: Regular rhythm, normal heart sounds and intact distal pulses.  Tachycardia present.  Exam reveals no gallop and no friction rub.   No murmur  heard. Pulmonary/Chest: No respiratory distress. She has wheezes. She has no rales.  Tachypnea, wheezing.  Abdominal: Soft. Bowel sounds are normal. She exhibits no distension and no mass. There is no tenderness.  Musculoskeletal: Normal range of motion. She exhibits no edema or tenderness.  Lymphadenopathy:    She has no cervical adenopathy.  Neurological: She is alert. Coordination normal.  Skin: Skin is warm and dry. No rash noted. No erythema.  Psychiatric: She has a normal mood and affect. Her behavior is normal.  Nursing note and vitals reviewed.   ED Course  Procedures (including critical care time)  DIAGNOSTIC STUDIES: Oxygen Saturation is 100% on room air, normal by my interpretation.    COORDINATION OF CARE: 9:31 AM - Discussed treatment plan with pt at bedside which includes diagnostic tests and medication, and pt agreed to plan.   Labs Review Labs Reviewed  CBC - Abnormal; Notable for the following:    RBC 5.62 (*)    Hemoglobin 17.7 (*)    HCT 48.4 (*)    MCHC 36.6 (*)    All other components within normal limits  BASIC METABOLIC PANEL - Abnormal; Notable for the following:    Potassium 3.4 (*)    Glucose, Bld 108 (*)    GFR calc non Af Amer 74 (*)    GFR calc Af Amer 86 (*)    All other components within normal limits  URINALYSIS, ROUTINE W REFLEX MICROSCOPIC - Abnormal; Notable for the following:    Specific Gravity, Urine <1.005 (*)    Hgb urine dipstick TRACE (*)    All other components within normal limits  URINE MICROSCOPIC-ADD ON - Abnormal; Notable for the following:    Bacteria, UA MANY (*)    All other components within normal limits  D-DIMER, QUANTITATIVE  TROPONIN I  PREGNANCY, URINE    Imaging Review Dg Chest 2 View  10/26/2014   CLINICAL DATA:  44 year old female with new productive cough and congestion. Awoke at 0230 hours not feeling well. Shortness of breath and nausea. Initial encounter.  EXAM: CHEST  2 VIEW  COMPARISON:  08/15/2010 and  earlier.  FINDINGS: Normal cardiac size and mediastinal contours. Visualized tracheal air column is within normal limits. Lung volumes are stable and within normal limits. Stable mild increased interstitial markings, lung parenchyma otherwise stable and clear. No pneumothorax or pleural effusion. Chronic midshaft left clavicle deformity, new since 2010. No acute osseous abnormality identified.  IMPRESSION: No acute cardiopulmonary abnormality.   Electronically Signed   By: Genevie Ann M.D.   On: 10/26/2014 10:48     EKG Interpretation   Date/Time:  Monday October 26 2014 09:36:31 EST Ventricular Rate:  93 PR Interval:  153 QRS Duration: 97 QT Interval:  344 QTC Calculation: 428 R Axis:   73 Text Interpretation:  Sinus rhythm RSR' in V1 or V2, right  VCD or RVH  Since last tracing rate faster ECG OTHERWISE WITHIN NORMAL LIMITS  Confirmed by Mayanna Garlitz  MD, [place] ([id number]) on 10/26/2014 10:01:07 AM       11:50 AM-On re-evaluation, patient has a HR of 90. Informed of results. Agreeable to fluids for dehydration. Anticipate discharge.   MDM   Final diagnoses:  Chest pain    At this time the patient has been sleeping, tachycardia has resolved, labs show that the patient has a negative d-dimer, negative troponin , elevated hemoglobin level consistent with being dehydrated and normal metabolic panel. The patient has been informed of these results, she has been given IV fluids for some rehydration, she will be discharged home in a stable and improved condition.    Meds given in ED:  Medications  LORazepam (ATIVAN) injection 1 mg (1 mg Intravenous Given 10/26/14 0950)  albuterol (PROVENTIL) (2.5 MG/3ML) 0.083% nebulizer solution 5 mg (5 mg Nebulization Given 10/26/14 0947)  sodium chloride 0.9 % bolus 1,000 mL (1,000 mLs Intravenous New Bag/Given 10/26/14 1200)    New Prescriptions   NAPROXEN (NAPROSYN) 500 MG TABLET    Take 1 tablet (500 mg total) by mouth 2 (two) times daily with a meal.    ONDANSETRON (ZOFRAN) 4 MG TABLET    Take 1 tablet (4 mg total) by mouth every 8 (eight) hours as needed for nausea or vomiting.     I personally performed the services described in this documentation, which was scribed in my presence. The recorded information has been reviewed and is accurate.     Dominique Acosta, MD 10/26/14 [phone]

## 2014-10-26 NOTE — ED Notes (Signed)
Pt reports onset of CP and SOB this am.

## 2014-10-26 NOTE — Discharge Instructions (Signed)

## 2015-05-31 ENCOUNTER — Emergency Department (HOSPITAL_COMMUNITY)
Admission: EM | Admit: 2015-05-31 | Discharge: 2015-05-31 | Disposition: A | Discharge status: Home / Self Care | Payer: Self-pay | Attending: Emergency Medicine | Admitting: Emergency Medicine

## 2015-05-31 ENCOUNTER — Encounter (HOSPITAL_COMMUNITY): Payer: Self-pay | Admitting: Emergency Medicine

## 2015-05-31 DIAGNOSIS — Z720 Tobacco use: Secondary | ICD-10-CM | POA: Insufficient documentation

## 2015-05-31 DIAGNOSIS — G43809 Other migraine, not intractable, without status migrainosus: Secondary | ICD-10-CM | POA: Insufficient documentation

## 2015-05-31 MED ORDER — KETOROLAC TROMETHAMINE 30 MG/ML IJ SOLN
30.0000 mg | Freq: Once | INTRAMUSCULAR | Status: AC
  Administered 2015-05-31: 30 mg via INTRAVENOUS
  Filled 2015-05-31: qty 1

## 2015-05-31 MED ORDER — SODIUM CHLORIDE 0.9 % IV SOLN
Freq: Once | INTRAVENOUS | Status: AC
  Administered 2015-05-31: 10:00:00 via INTRAVENOUS

## 2015-05-31 MED ORDER — METOCLOPRAMIDE HCL 5 MG/ML IJ SOLN
10.0000 mg | Freq: Once | INTRAMUSCULAR | Status: AC
  Administered 2015-05-31: 10 mg via INTRAVENOUS
  Filled 2015-05-31: qty 2

## 2015-05-31 MED ORDER — DIPHENHYDRAMINE HCL 50 MG/ML IJ SOLN
25.0000 mg | Freq: Once | INTRAMUSCULAR | Status: AC
  Administered 2015-05-31: 25 mg via INTRAVENOUS
  Filled 2015-05-31: qty 1

## 2015-05-31 NOTE — ED Notes (Signed)
Pt c/o of migraine headache and nausea since Saturday. Denies vision changes. Pt has hx of migraines.

## 2015-05-31 NOTE — Discharge Instructions (Signed)

## 2015-05-31 NOTE — ED Provider Notes (Signed)
CSN: [id number]     Arrival date & time 05/31/15  6606 History   First MD Initiated Contact with Patient 05/31/15 0900     Chief Complaint  Patient presents with  . Migraine     (Consider location/radiation/quality/duration/timing/severity/associated sxs/prior Treatment) Patient is a 44 y.o. female presenting with migraines. The history is provided by the patient. No language interpreter was used.  Migraine This is a recurrent problem. The current episode started today. The problem occurs constantly. The problem has been gradually worsening. Associated symptoms include nausea. Pertinent negatives include no vomiting. Nothing aggravates the symptoms. She has tried nothing for the symptoms. The treatment provided moderate relief.  Pt has a history of migraines.   Pt complains of a migraine today  History reviewed. No pertinent past medical history. Past Surgical History  Procedure Laterality Date  . Abdominal hysterectomy    . Tubal ligation    . Appendectomy    . Cholecystectomy    . Breast surgery     Family History  Problem Relation Age of Onset  . Diabetes Mother   . Alzheimer's disease Father   . Hypertension Other    Social History  Substance Use Topics  . Smoking status: Current Every Day Smoker -- 1.00 packs/day    Types: Cigarettes  . Smokeless tobacco: None  . Alcohol Use: Yes     Comment: occasionally   OB History    Gravida Para Term Preterm AB TAB SAB Ectopic Multiple Living   4 3 3  1  1         Review of Systems  Gastrointestinal: Positive for nausea. Negative for vomiting.  All other systems reviewed and are negative.     Allergies  Cortisone and Doxycycline  Home Medications   Prior to Admission medications   Medication Sig Start Date End Date Taking? Authorizing Provider  ibuprofen (ADVIL,MOTRIN) 200 MG tablet Take 800 mg by mouth every 6 (six) hours as needed.   Yes Historical Provider, MD  amoxicillin (AMOXIL) 500 MG capsule Take 1 capsule  (500 mg total) by mouth 3 (three) times daily. Patient not taking: Reported on 10/26/2014 10/20/12   Rolland Porter, MD  antipyrine-benzocaine Toniann Fail) otic solution Place 3 drops into the right ear every 2 (two) hours as needed for pain. Patient not taking: Reported on 10/26/2014 10/20/12   Rolland Porter, MD  naproxen (NAPROSYN) 500 MG tablet Take 1 tablet (500 mg total) by mouth 2 (two) times daily with a meal. Patient not taking: Reported on 05/31/2015 10/26/14   Noemi Chapel, MD  ondansetron (ZOFRAN) 4 MG tablet Take 1 tablet (4 mg total) by mouth every 8 (eight) hours as needed for nausea or vomiting. Patient not taking: Reported on 05/31/2015 10/26/14   Noemi Chapel, MD   BP 127/80 mmHg  Pulse 67  Temp(Src) 98.2 F (36.8 C) (Oral)  Resp 18  Ht 5\' 6"  (1.676 m)  Wt 170 lb (77.111 kg)  BMI 27.45 kg/m2  SpO2 98% Physical Exam  Constitutional: She is oriented to person, place, and time. She appears well-developed and well-nourished.  HENT:  Head: Normocephalic.  Eyes: Conjunctivae and EOM are normal. Pupils are equal, round, and reactive to light.  Neck: Normal range of motion.  Cardiovascular: Normal rate and normal heart sounds.   Pulmonary/Chest: Effort normal.  Abdominal: She exhibits no distension.  Musculoskeletal: Normal range of motion.  Neurological: She is alert and oriented to person, place, and time.  Skin: Skin is warm.  Psychiatric: She has a  normal mood and affect.  Nursing note and vitals reviewed.   ED Course  Procedures (including critical care time) Labs Review Labs Reviewed - No data to display  Imaging Review No results found. I have personally reviewed and evaluated these images and lab results as part of my medical decision-making.   EKG Interpretation None      MDM  Pt given Iv ns x 1 liter.  Reglan, torodol and benadryl.   Pt reports relief from headache   Final diagnoses:  Other migraine without status migrainosus, not intractable    Pt advised to see  her MD for recheck    Fransico Meadow, PA-C 05/31/15 [place], MD 05/31/15 [phone]

## 2015-08-11 ENCOUNTER — Emergency Department (HOSPITAL_COMMUNITY): Payer: Self-pay

## 2015-08-11 ENCOUNTER — Emergency Department (HOSPITAL_COMMUNITY)
Admission: EM | Admit: 2015-08-11 | Discharge: 2015-08-11 | Disposition: A | Discharge status: Home / Self Care | Payer: Self-pay | Attending: Emergency Medicine | Admitting: Emergency Medicine

## 2015-08-11 ENCOUNTER — Encounter (HOSPITAL_COMMUNITY): Payer: Self-pay

## 2015-08-11 DIAGNOSIS — M79674 Pain in right toe(s): Secondary | ICD-10-CM | POA: Insufficient documentation

## 2015-08-11 DIAGNOSIS — F17210 Nicotine dependence, cigarettes, uncomplicated: Secondary | ICD-10-CM | POA: Insufficient documentation

## 2015-08-11 MED ORDER — DICLOFENAC SODIUM 50 MG PO TBEC: 50.0000 mg | DELAYED_RELEASE_TABLET | Freq: Two times a day (BID) | ORAL | Status: DC

## 2015-08-11 NOTE — ED Notes (Signed)
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  Reports pain in the joint of left great toe.

## 2015-08-11 NOTE — ED Provider Notes (Signed)
CSN: [id number]     Arrival date & time 08/11/15  1632 History   First MD Initiated Contact with Patient 08/11/15 1741     Chief Complaint  Patient presents with  . Foot Pain     (Consider location/radiation/quality/duration/timing/severity/associated sxs/prior Treatment) Patient is a 44 y.o. female presenting with lower extremity pain. The history is provided by the patient.  Foot Pain This is a new problem. The current episode started yesterday. The problem occurs constantly. The problem has been gradually worsening.   Dominique Hinton is a 44 y.o. 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  Pain is at the base of the great toe History reviewed. No pertinent past medical history. Past Surgical History  Procedure Laterality Date  . Abdominal hysterectomy    . Tubal ligation    . Appendectomy    . Cholecystectomy    . Breast surgery     Family History  Problem Relation Age of Onset  . Diabetes Mother   . Alzheimer's disease Father   . Hypertension Other    Social History  Substance Use Topics  . Smoking status: Current Every Day Smoker -- 1.00 packs/day    Types: Cigarettes  . Smokeless tobacco: None  . Alcohol Use: Yes     Comment: occasionally   OB History    Gravida Para Term Preterm AB TAB SAB Ectopic Multiple Living   4 3 3  1  1         Review of Systems  Musculoskeletal:       Pain of left great toe   All other systems negative   Allergies  Cortisone and Doxycycline  Home Medications   Prior to Admission medications   Medication Sig Start Date End Date Taking? Authorizing Provider  amoxicillin (AMOXIL) 500 MG capsule Take 1 capsule (500 mg total) by mouth 3 (three) times daily. Patient not taking: Reported on 10/26/2014 10/20/12   Rolland Porter, MD  antipyrine-benzocaine Toniann Fail) otic solution Place 3 drops into the right ear every 2 (two) hours as needed for pain. Patient not taking:  Reported on 10/26/2014 10/20/12   Rolland Porter, MD  diclofenac (VOLTAREN) 50 MG EC tablet Take 1 tablet (50 mg total) by mouth 2 (two) times daily. 08/11/15   Chane Cowden Bunnie Pion, NP  ibuprofen (ADVIL,MOTRIN) 200 MG tablet Take 800 mg by mouth every 6 (six) hours as needed.    Historical Provider, MD  ondansetron (ZOFRAN) 4 MG tablet Take 1 tablet (4 mg total) by mouth every 8 (eight) hours as needed for nausea or vomiting. Patient not taking: Reported on 05/31/2015 10/26/14   Noemi Chapel, MD   There were no vitals taken for this visit. Physical Exam  Constitutional: She is oriented to person, place, and time. She appears well-developed and well-nourished. No distress.  HENT:  Head: Normocephalic.  Eyes: EOM are normal.  Neck: Normal range of motion. Neck supple.  Cardiovascular: Normal rate.   Pulmonary/Chest: Effort normal.  Musculoskeletal: Normal range of motion.       Left foot: There is tenderness. There is normal capillary refill, no deformity and no laceration. Decreased range of motion: due to pain. Swelling: minimal swelling.       Feet:  Pedal pulses 2+, adequate circulation, good touch sensation. Pain with palpation at the base of the left great toe.   Neurological: She is alert and oriented to person, place, and time. No cranial nerve  deficit.  Skin: Skin is warm and dry.  Psychiatric: She has a normal mood and affect. Her behavior is normal.  Nursing note and vitals reviewed.   ED Course  Procedures (including critical care time) Labs Review Labs Reviewed - No data to display  Imaging Review Dg Toe Great Left  08/11/2015  CLINICAL DATA:  pain in left great toe EXAM: LEFT GREAT TOE COMPARISON:  None. FINDINGS: There is no evidence of fracture or dislocation. There is no evidence of arthropathy or other focal bone abnormality. Soft tissues are unremarkable. IMPRESSION: Negative. Electronically Signed   By: Kerby Moors M.D.   On: 08/11/2015 17:23    MDM  44 y.o. female with pain  at the base of the left great toe after wearing booth that rubbed her foot all day at work yesterday. Stable for d/c without focal neuro deficits. Ace wrap applied, NSAIDS. Discussed with the patient and all questioned fully answered. She will return if any problems arise.   Final diagnoses:  Pain of right great toe      Ashley Murrain, NP 08/11/15 1805  Varney Biles, MD 08/12/15 0003

## 2016-02-16 ENCOUNTER — Emergency Department (HOSPITAL_COMMUNITY): Payer: Self-pay

## 2016-02-16 ENCOUNTER — Encounter (HOSPITAL_COMMUNITY): Payer: Self-pay | Admitting: Emergency Medicine

## 2016-02-16 ENCOUNTER — Emergency Department (HOSPITAL_COMMUNITY)
Admission: EM | Admit: 2016-02-16 | Discharge: 2016-02-16 | Disposition: A | Discharge status: Home / Self Care | Payer: Self-pay | Attending: Emergency Medicine | Admitting: Emergency Medicine

## 2016-02-16 DIAGNOSIS — F17210 Nicotine dependence, cigarettes, uncomplicated: Secondary | ICD-10-CM | POA: Insufficient documentation

## 2016-02-16 DIAGNOSIS — Y939 Activity, unspecified: Secondary | ICD-10-CM | POA: Insufficient documentation

## 2016-02-16 DIAGNOSIS — S60042A Contusion of left ring finger without damage to nail, initial encounter: Secondary | ICD-10-CM

## 2016-02-16 DIAGNOSIS — Y929 Unspecified place or not applicable: Secondary | ICD-10-CM | POA: Insufficient documentation

## 2016-02-16 DIAGNOSIS — Y999 Unspecified external cause status: Secondary | ICD-10-CM | POA: Insufficient documentation

## 2016-02-16 DIAGNOSIS — W228XXA Striking against or struck by other objects, initial encounter: Secondary | ICD-10-CM | POA: Insufficient documentation

## 2016-02-16 DIAGNOSIS — T1490XA Injury, unspecified, initial encounter: Secondary | ICD-10-CM

## 2016-02-16 MED ORDER — IBUPROFEN 400 MG PO TABS
600.0000 mg | ORAL_TABLET | Freq: Once | ORAL | Status: AC
  Administered 2016-02-16: 600 mg via ORAL
  Filled 2016-02-16: qty 2

## 2016-02-16 NOTE — Discharge Instructions (Signed)
Use ice and elevate every 2-3 hrs while awake. Wear splint until you see a physician. Ibuprofen 600 mg and tylenol 1000 mg every 6 hrs for pain.   If you were given medicines take as directed.  If you are on coumadin or contraceptives realize their levels and effectiveness is altered by many different medicines.  If you have any reaction (rash, tongues swelling, other) to the medicines stop taking and see a physician.    If your blood pressure was elevated in the ER make sure you follow up for management with a primary doctor or return for chest pain, shortness of breath or stroke symptoms.  Please follow up as directed and return to the ER or see a physician for new or worsening symptoms.  Thank you. Filed Vitals:   02/16/16 0656  BP: 126/102  Pulse: 88  Temp: 98.5 F (36.9 C)  TempSrc: Oral  Resp: 18  Height: 5\' 6"  (1.676 m)  Weight: 170 lb (77.111 kg)  SpO2: 99%

## 2016-02-16 NOTE — ED Notes (Signed)
Pt reports left ring finger pain since yesterday. Pt reports was trying to block being hit by a pillow and reports left ring finger swelling and decreased ROM ever since. Distal pulses moderate.

## 2016-02-16 NOTE — ED Provider Notes (Signed)
CSN: [id number]     Arrival date & time 02/16/16  [id number] History   First MD Initiated Contact with Patient 02/16/16 0703     Chief Complaint  Patient presents with  . Finger Injury     (Consider location/radiation/quality/duration/timing/severity/associated sxs/prior Treatment) HPI Comments: 45 yo female with smoking hx, no significant hx presents with left ring finger injury.  atient was trying toblock being hit by a pillow and injured her left ring finger. Patient is swelling and pain with range of motion. No other injuries.  The history is provided by the patient.    History reviewed. No pertinent past medical history. Past Surgical History  Procedure Laterality Date  . Abdominal hysterectomy    . Tubal ligation    . Appendectomy    . Cholecystectomy    . Breast surgery     Family History  Problem Relation Age of Onset  . Diabetes Mother   . Alzheimer's disease Father   . Hypertension Other    Social History  Substance Use Topics  . Smoking status: Current Every Day Smoker -- 1.00 packs/day    Types: Cigarettes  . Smokeless tobacco: None  . Alcohol Use: Yes     Comment: occasionally   OB History    Gravida Para Term Preterm AB TAB SAB Ectopic Multiple Living   4 3 3  1  1         Review of Systems    Allergies  Cortisone and Doxycycline  Home Medications   Prior to Admission medications   Medication Sig Start Date End Date Taking? Authorizing Provider  amoxicillin (AMOXIL) 500 MG capsule Take 1 capsule (500 mg total) by mouth 3 (three) times daily. Patient not taking: Reported on 10/26/2014 10/20/12   Rolland Porter, MD  antipyrine-benzocaine Toniann Fail) otic solution Place 3 drops into the right ear every 2 (two) hours as needed for pain. Patient not taking: Reported on 10/26/2014 10/20/12   Rolland Porter, MD  diclofenac (VOLTAREN) 50 MG EC tablet Take 1 tablet (50 mg total) by mouth 2 (two) times daily. 08/11/15   Hope Bunnie Pion, NP  ibuprofen (ADVIL,MOTRIN) 200 MG tablet  Take 800 mg by mouth every 6 (six) hours as needed.    Historical Provider, MD  ondansetron (ZOFRAN) 4 MG tablet Take 1 tablet (4 mg total) by mouth every 8 (eight) hours as needed for nausea or vomiting. Patient not taking: Reported on 05/31/2015 10/26/14   Noemi Chapel, MD   BP 126/102 mmHg  Pulse 88  Temp(Src) 98.5 F (36.9 C) (Oral)  Resp 18  Ht 5\' 6"  (1.676 m)  Wt 170 lb (77.111 kg)  BMI 27.45 kg/m2  SpO2 99% Physical Exam  Constitutional: She is oriented to person, place, and time. She appears well-developed and well-nourished.  HENT:  Head: Normocephalic and atraumatic.  Eyes: Right eye exhibits no discharge. Left eye exhibits no discharge.  Neck: Normal range of motion. Neck supple. No tracheal deviation present.  Cardiovascular: Normal rate.   Pulmonary/Chest: Effort normal.  Abdominal: Soft. She exhibits no distension. There is no tenderness. There is no guarding.  Musculoskeletal: She exhibits edema and tenderness.  Tender PIP and DIP with swelling of ring finger on left, no open wounds. Pt can flex and extend in isolation at each joint, difficulty with assessing full strength due to edema  Neurological: She is alert and oriented to person, place, and time.  Skin: Skin is warm. No rash noted.  Psychiatric: She has a normal mood and affect.  Nursing note and vitals reviewed.   ED Course  Procedures (including critical care time) Labs Review Labs Reviewed - No data to display  Imaging Review Dg Finger Ring Left  02/16/2016  CLINICAL DATA:  LEFT ring finger pain since yesterday. EXAM: LEFT RING FINGER 2+V COMPARISON:  None. FINDINGS: There is no evidence of fracture or dislocation. There is no evidence of erosive arthropathy. Soft tissue swelling. Chronic osseous spurring from the middle phalanx ventrally at the PIP joint. IMPRESSION: Negative for fracture.  Soft tissue swelling. Electronically Signed   By: Staci Righter M.D.   On: 02/16/2016 07:23   I have personally  reviewed and evaluated these images and lab results as part of my medical decision-making.   EKG Interpretation None      MDM   Final diagnoses:  Injury  Contusion of left ring finger, initial encounter   Patient presents with isolated finger contusion. X-ray reviewed with patient no fracture mild spurring. Discussed possible tendon injury finger splint and outpatient follow up with orthopedics.  Results and differential diagnosis were discussed with the patient/parent/guardian. Xrays were independently reviewed by myself.  Close follow up outpatient was discussed, comfortable with the plan.   Medications  ibuprofen (ADVIL,MOTRIN) tablet 600 mg (600 mg Oral Given 02/16/16 0736)    Filed Vitals:   02/16/16 0656  BP: 126/102  Pulse: 88  Temp: 98.5 F (36.9 C)  TempSrc: Oral  Resp: 18  Height: 5\' 6"  (1.676 m)  Weight: 170 lb (77.111 kg)  SpO2: 99%    Final diagnoses:  Injury  Contusion of left ring finger, initial encounter        Elnora Morrison, MD 02/16/16 [phone]

## 2016-10-21 ENCOUNTER — Emergency Department (HOSPITAL_COMMUNITY): Payer: Self-pay

## 2016-10-21 ENCOUNTER — Encounter (HOSPITAL_COMMUNITY): Payer: Self-pay | Admitting: Emergency Medicine

## 2016-10-21 ENCOUNTER — Emergency Department (HOSPITAL_COMMUNITY)
Admission: EM | Admit: 2016-10-21 | Discharge: 2016-10-21 | Disposition: A | Discharge status: Home / Self Care | Payer: Self-pay | Attending: Emergency Medicine | Admitting: Emergency Medicine

## 2016-10-21 DIAGNOSIS — Y929 Unspecified place or not applicable: Secondary | ICD-10-CM | POA: Insufficient documentation

## 2016-10-21 DIAGNOSIS — Y9389 Activity, other specified: Secondary | ICD-10-CM | POA: Insufficient documentation

## 2016-10-21 DIAGNOSIS — Z79899 Other long term (current) drug therapy: Secondary | ICD-10-CM | POA: Insufficient documentation

## 2016-10-21 DIAGNOSIS — Y999 Unspecified external cause status: Secondary | ICD-10-CM | POA: Insufficient documentation

## 2016-10-21 DIAGNOSIS — X501XXA Overexertion from prolonged static or awkward postures, initial encounter: Secondary | ICD-10-CM | POA: Insufficient documentation

## 2016-10-21 DIAGNOSIS — F17210 Nicotine dependence, cigarettes, uncomplicated: Secondary | ICD-10-CM | POA: Insufficient documentation

## 2016-10-21 DIAGNOSIS — S93402A Sprain of unspecified ligament of left ankle, initial encounter: Secondary | ICD-10-CM | POA: Insufficient documentation

## 2016-10-21 MED ORDER — HYDROCODONE-ACETAMINOPHEN 5-325 MG PO TABS: ORAL_TABLET | ORAL | 0 refills | Status: DC

## 2016-10-21 NOTE — ED Triage Notes (Signed)
Patient c/o left ankle/foot pain and swelling that has been intermittent since last Thursday. Patient states stepped in hole and twisted ankle.

## 2016-10-21 NOTE — ED Provider Notes (Signed)
[facility] DEPT Provider Note   CSN: [id number] Arrival date & time: 10/21/16  1417     History   Chief Complaint Chief Complaint  Patient presents with  . Ankle Pain    HPI Dominique Hinton is a 46 y.o. female.  HPI  Dominique Hinton is a 46 y.o. female who presents to the Emergency Department complaining of left ankle pain and swelling for two days.  She reports a twisting injury to her ankle after stepping in a hole.  She describes an intermittent pain to ankle initially, she wore an OTC ankle brace for one day and continued to stand and work.  Pain became worse and has been waxing and waning since then and associated with weight bearing. She denies numbness, calf pain, discoloration or other injuries.     History reviewed. No pertinent past medical history.  There are no active problems to display for this patient.   Past Surgical History:  Procedure Laterality Date  . ABDOMINAL HYSTERECTOMY    . APPENDECTOMY    . BREAST SURGERY    . CHOLECYSTECTOMY    . TUBAL LIGATION      OB History    Gravida Para Term Preterm AB Living   4 3 3   1      SAB TAB Ectopic Multiple Live Births   1               Home Medications    Prior to Admission medications   Medication Sig Start Date End Date Taking? Authorizing Provider  amoxicillin (AMOXIL) 500 MG capsule Take 1 capsule (500 mg total) by mouth 3 (three) times daily. Patient not taking: Reported on 10/26/2014 10/20/12   Rolland Porter, MD  antipyrine-benzocaine Toniann Fail) otic solution Place 3 drops into the right ear every 2 (two) hours as needed for pain. Patient not taking: Reported on 10/26/2014 10/20/12   Rolland Porter, MD  diclofenac (VOLTAREN) 50 MG EC tablet Take 1 tablet (50 mg total) by mouth 2 (two) times daily. 08/11/15   Hope Bunnie Pion, NP  HYDROcodone-acetaminophen (NORCO/VICODIN) 5-325 MG tablet Take one tab po q 4-6 hrs prn pain 10/21/16   Onis Markoff, PA-C  ibuprofen (ADVIL,MOTRIN) 200 MG tablet Take 800 mg by  mouth every 6 (six) hours as needed.    Historical Provider, MD  ondansetron (ZOFRAN) 4 MG tablet Take 1 tablet (4 mg total) by mouth every 8 (eight) hours as needed for nausea or vomiting. Patient not taking: Reported on 05/31/2015 10/26/14   Noemi Chapel, MD    Family History Family History  Problem Relation Age of Onset  . Diabetes Mother   . Alzheimer's disease Father   . Hypertension Other     Social History Social History  Substance Use Topics  . Smoking status: Current Every Day Smoker    Packs/day: 1.00    Types: Cigarettes  . Smokeless tobacco: Never Used  . Alcohol use Yes     Comment: occasionally     Allergies   Cortisone and Doxycycline   Review of Systems Review of Systems  Constitutional: Negative for chills and fever.  Gastrointestinal: Negative for nausea and vomiting.  Musculoskeletal: Positive for arthralgias and joint swelling. Negative for back pain and neck pain.  Skin: Negative for color change and wound.  Neurological: Negative for weakness and numbness.  All other systems reviewed and are negative.    Physical Exam Updated Vital Signs BP (!) 153/102 Comment: BP repeated   Pulse 83   Temp  99.3 F (37.4 C) (Oral)   Resp 18   Ht 5\' 6"  (1.676 m)   Wt 86.2 kg   SpO2 97%   BMI 30.67 kg/m   Physical Exam  Constitutional: She is oriented to person, place, and time. She appears well-developed and well-nourished. No distress.  HENT:  Head: Normocephalic and atraumatic.  Neck: Normal range of motion.  Cardiovascular: Normal rate, regular rhythm, normal heart sounds and intact distal pulses.   Pulmonary/Chest: Effort normal and breath sounds normal.  Musculoskeletal: She exhibits edema and tenderness.  Diffuse ttp of the medial and lateral left ankle.  Mild edema at the lateral malleolus with slight bruising also present.  Achilles tendon appears intact.  DP pulse is brisk, distal sensation intact.  No erythema, abrasion,  or bony deformity.  No  proximal tenderness.  Neurological: She is alert and oriented to person, place, and time. She exhibits normal muscle tone. Coordination normal.  Skin: Skin is warm and dry.  Nursing note and vitals reviewed.    ED Treatments / Results  Labs (all labs ordered are listed, but only abnormal results are displayed) Labs Reviewed - No data to display  EKG  EKG Interpretation None       Radiology Dg Ankle Complete Left  Result Date: 10/21/2016 CLINICAL DATA:  Twisted left ankle 1 week ago.  Worsening pain. EXAM: LEFT ANKLE COMPLETE - 3+ VIEW COMPARISON:  None. FINDINGS: There is no evidence of fracture, dislocation, or joint effusion. There is no evidence of arthropathy or other focal bone abnormality. Soft tissues are unremarkable. IMPRESSION: Negative. Electronically Signed   By: Misty Stanley M.D.   On: 10/21/2016 15:35    Procedures Procedures (including critical care time)  Medications Ordered in ED Medications - No data to display   Initial Impression / Assessment and Plan / ED Course  I have reviewed the triage vital signs and the nursing notes.  Pertinent labs & imaging results that were available during my care of the patient were reviewed by me and considered in my medical decision making (see chart for details).     Likely ankle sprain.  NV intact.  Pt agrees to RICE therapy.  ASO applied, crutches given.  She agrees to ortho f/u in one week if not improving.   Final Clinical Impressions(s) / ED Diagnoses   Final diagnoses:  Sprain of left ankle, unspecified ligament, initial encounter    New Prescriptions New Prescriptions   HYDROCODONE-ACETAMINOPHEN (NORCO/VICODIN) 5-325 MG TABLET    Take one tab po q 4-6 hrs prn pain     Kem Parkinson, PA-C 10/21/16 1712    Dorie Rank, MD 10/22/16 1635

## 2016-10-21 NOTE — Discharge Instructions (Signed)
Elevate and apply ice packs on/off to your ankle.  Crutches for weight bearing for at least 7 days.  Call Dr. Ruthe Mannan office to arrange a follow-up appt. In one week if not improving

## 2016-12-28 ENCOUNTER — Emergency Department (HOSPITAL_COMMUNITY)
Admission: EM | Admit: 2016-12-28 | Discharge: 2016-12-28 | Disposition: A | Discharge status: Home / Self Care | Payer: Self-pay | Attending: Emergency Medicine | Admitting: Emergency Medicine

## 2016-12-28 DIAGNOSIS — F17210 Nicotine dependence, cigarettes, uncomplicated: Secondary | ICD-10-CM | POA: Insufficient documentation

## 2016-12-28 DIAGNOSIS — G43009 Migraine without aura, not intractable, without status migrainosus: Secondary | ICD-10-CM | POA: Insufficient documentation

## 2016-12-28 MED ORDER — PROCHLORPERAZINE MALEATE 10 MG PO TABS: 10.0000 mg | ORAL_TABLET | Freq: Two times a day (BID) | ORAL | 0 refills | Status: DC | PRN

## 2016-12-28 MED ORDER — SODIUM CHLORIDE 0.9 % IV BOLUS (SEPSIS)
1000.0000 mL | Freq: Once | INTRAVENOUS | Status: AC
  Administered 2016-12-28: 1000 mL via INTRAVENOUS

## 2016-12-28 MED ORDER — PROCHLORPERAZINE EDISYLATE 5 MG/ML IJ SOLN
10.0000 mg | Freq: Once | INTRAMUSCULAR | Status: AC
  Administered 2016-12-28: 10 mg via INTRAVENOUS
  Filled 2016-12-28: qty 2

## 2016-12-28 MED ORDER — KETOROLAC TROMETHAMINE 30 MG/ML IJ SOLN
30.0000 mg | Freq: Once | INTRAMUSCULAR | Status: AC
Start: 2016-12-28 — End: 2016-12-28
  Administered 2016-12-28: 30 mg via INTRAVENOUS
  Filled 2016-12-28: qty 1

## 2016-12-28 MED ORDER — DIPHENHYDRAMINE HCL 50 MG/ML IJ SOLN
50.0000 mg | Freq: Once | INTRAMUSCULAR | Status: AC
  Administered 2016-12-28: 50 mg via INTRAVENOUS
  Filled 2016-12-28: qty 1

## 2016-12-28 NOTE — ED Triage Notes (Signed)
Pt. Has a headache. Her neck is in pain as well. Feels like she has a pinched nerve. Symptoms started yesterday. Pt. Suffers from migraines. Pain went away yesterday, but came back. Pt. Gets light headed and gets blurry vision.

## 2016-12-28 NOTE — Discharge Instructions (Addendum)
Continue ibuprofen, compazine, benadryl at home as needed for headache. Avoid taking ibuprofen around the clock for several days in a row as it can cause rebound headaches when you stop this medication  Return for worsening symptoms, including fever, confusion, intractable vomiting, escalating pain or any other symptoms concerning to you.

## 2016-12-28 NOTE — ED Provider Notes (Signed)
[facility] DEPT Provider Note   CSN: [id number] Arrival date & time: 12/28/16  1245     History   Chief Complaint Chief Complaint  Patient presents with  . Headache    HPI Dominique Hinton is a 46 y.o. female.  The history is provided by the patient.  Headache   This is a recurrent problem. The current episode started yesterday. The problem occurs constantly. The problem has been gradually worsening. Associated with: pollens, seasonal allergies. The pain is located in the occipital region. The quality of the pain is described as sharp and throbbing. The pain is moderate. Radiates to: to top of head. Pertinent negatives include no fever, no malaise/fatigue, no chest pressure, no near-syncope, no palpitations, no syncope, no shortness of breath, no nausea and no vomiting. She has tried cold packs, NSAIDs and oral narcotic analgesics for the symptoms. The treatment provided no relief.   History of migraine headaches. States that yesterday morning woke up with pain over her upper neck in the back of her head that radiated towards the top of her head. This is typical of her usual migraine headaches. Associated with photophobia, phonophobia, mild blurry vision. No nausea or vomiting. Has had a lot of seasonal allergies recently which she thinks may have made her symptoms worse. No diplopia, confusion, focal numbness or weakness, fevers or chills, or recent URI symptoms. Tried ibuprofen yesterday with some relief or return of pain. Symptoms made worse with cold packs. Tried a friend's narcotic medication earlier today, without relief.   No past medical history on file.  There are no active problems to display for this patient.   Past Surgical History:  Procedure Laterality Date  . ABDOMINAL HYSTERECTOMY    . APPENDECTOMY    . BREAST SURGERY    . CHOLECYSTECTOMY    . TUBAL LIGATION      OB History    Gravida Para Term Preterm AB Living   4 3 3   1      SAB TAB Ectopic Multiple  Live Births   1               Home Medications    Prior to Admission medications   Medication Sig Start Date End Date Taking? Authorizing Provider  ibuprofen (ADVIL,MOTRIN) 200 MG tablet Take 800 mg by mouth every 6 (six) hours as needed.   Yes Historical Provider, MD  antipyrine-benzocaine Toniann Fail) otic solution Place 3 drops into the right ear every 2 (two) hours as needed for pain. Patient not taking: Reported on 10/26/2014 10/20/12   Rolland Porter, MD  diclofenac (VOLTAREN) 50 MG EC tablet Take 1 tablet (50 mg total) by mouth 2 (two) times daily. Patient not taking: Reported on 12/28/2016 08/11/15   Ashley Murrain, NP  HYDROcodone-acetaminophen (NORCO/VICODIN) 5-325 MG tablet Take one tab po q 4-6 hrs prn pain Patient not taking: Reported on 12/28/2016 10/21/16   Tammy Triplett, PA-C  ondansetron (ZOFRAN) 4 MG tablet Take 1 tablet (4 mg total) by mouth every 8 (eight) hours as needed for nausea or vomiting. Patient not taking: Reported on 05/31/2015 10/26/14   Noemi Chapel, MD    Family History Family History  Problem Relation Age of Onset  . Diabetes Mother   . Alzheimer's disease Father   . Hypertension Other     Social History Social History  Substance Use Topics  . Smoking status: Current Every Day Smoker    Packs/day: 1.00    Types: Cigarettes  . Smokeless tobacco: Never  Used  . Alcohol use Yes     Comment: occasionally     Allergies   Cortisone and Doxycycline   Review of Systems Review of Systems  Constitutional: Negative for fever and malaise/fatigue.  HENT: Positive for rhinorrhea and sinus pressure.   Eyes: Positive for photophobia.  Respiratory: Negative for shortness of breath.   Cardiovascular: Negative for palpitations, syncope and near-syncope.  Gastrointestinal: Negative for nausea and vomiting.  Musculoskeletal: Negative for back pain.  Allergic/Immunologic: Negative for immunocompromised state.  Neurological: Positive for headaches.  Hematological:  Does not bruise/bleed easily.  Psychiatric/Behavioral: Negative for confusion.  All other systems reviewed and are negative.    Physical Exam Updated Vital Signs BP (!) 155/107 (BP Location: Right Arm)   Pulse 78   Temp 98.3 F (36.8 C) (Oral)   Resp 18   Ht 5\' 6"  (1.676 m)   Wt 195 lb (88.5 kg)   SpO2 98%   BMI 31.47 kg/m   Physical Exam Physical Exam  Nursing note and vitals reviewed. Constitutional: Well developed, well nourished, non-toxic, and in no acute distress Head: Normocephalic and atraumatic.  Mouth/Throat: Oropharynx is clear and moist.  Neck: Normal range of motion. Neck supple. no nuchal rigidity Cardiovascular: Normal rate and regular rhythm.   Pulmonary/Chest: Effort normal and breath sounds normal.  Abdominal: Soft. There is no tenderness. There is no rebound and no guarding.  Musculoskeletal: Normal range of motion.  Skin: Skin is warm and dry.  Psychiatric: Cooperative Neurological:  Alert, oriented to person, place, time, and situation. Memory grossly in tact. Fluent speech. No dysarthria or aphasia.  Cranial nerves: VF are full. EOMI without nystagmus. No gaze deviation. Facial muscles symmetric with activation. Sensation to light touch over face in tact bilaterally. Hearing grossly in tact. Palate elevates symmetrically. Head turn and shoulder shrug are intact. Tongue midline.  Reflexes defered.  Muscle bulk and tone normal. No pronator drift. Moves all extremities symmetrically. Sensation to light touch is in tact throughout in bilateral upper and lower extremities. Coordination reveals no dysmetria with finger to nose. Gait is narrow-based and steady. Non-ataxic.   ED Treatments / Results  Labs (all labs ordered are listed, but only abnormal results are displayed) Labs Reviewed - No data to display  EKG  EKG Interpretation None       Radiology No results found.  Procedures Procedures (including critical care time)  Medications  Ordered in ED Medications  sodium chloride 0.9 % bolus 1,000 mL (1,000 mLs Intravenous New Bag/Given 12/28/16 1532)  prochlorperazine (COMPAZINE) injection 10 mg (10 mg Intravenous Given 12/28/16 1532)  diphenhydrAMINE (BENADRYL) injection 50 mg (50 mg Intravenous Given 12/28/16 1532)  ketorolac (TORADOL) 30 MG/ML injection 30 mg (30 mg Intravenous Given 12/28/16 1532)     Initial Impression / Assessment and Plan / ED Course  I have reviewed the triage vital signs and the nursing notes.  Pertinent labs & imaging results that were available during my care of the patient were reviewed by me and considered in my medical decision making (see chart for details).     Presenting with typical migraine headache. No concerning features at this time that would suggest presence of intracranial infection, hemorrhage, thrombosis, or other serious intracranial processes. She has an intact neuro exam with stable vitals. She is nontoxic and in no acute distress. We'll treat with migraine cocktail and IV fluids.  The patient reevaluated at 1630. States that her headache significantly improved. She feels comfortable with discharge home. Discussed continue supportive  care instructions for home. Strict return and follow-up instructions reviewed. She expressed understanding of all discharge instructions and felt comfortable with the plan of care.   Final Clinical Impressions(s) / ED Diagnoses   Final diagnoses:  Migraine without aura and without status migrainosus, not intractable    New Prescriptions New Prescriptions   No medications on file     Forde Dandy, MD 12/28/16 [id number]

## 2017-11-30 ENCOUNTER — Emergency Department (HOSPITAL_COMMUNITY): Payer: Self-pay

## 2017-11-30 ENCOUNTER — Encounter (HOSPITAL_COMMUNITY): Payer: Self-pay

## 2017-11-30 ENCOUNTER — Emergency Department (HOSPITAL_COMMUNITY)
Admission: EM | Admit: 2017-11-30 | Discharge: 2017-11-30 | Disposition: A | Discharge status: Home / Self Care | Payer: Self-pay | Attending: Emergency Medicine | Admitting: Emergency Medicine

## 2017-11-30 DIAGNOSIS — R0789 Other chest pain: Secondary | ICD-10-CM | POA: Insufficient documentation

## 2017-11-30 DIAGNOSIS — F17210 Nicotine dependence, cigarettes, uncomplicated: Secondary | ICD-10-CM | POA: Insufficient documentation

## 2017-11-30 LAB — CBC WITH DIFFERENTIAL/PLATELET
BASOS PCT: 0 %
Basophils Absolute: 0 10*3/uL (ref 0.0–0.1)
EOS ABS: 0.1 10*3/uL (ref 0.0–0.7)
EOS PCT: 1 %
HCT: 48.7 % — ABNORMAL HIGH (ref 36.0–46.0)
Hemoglobin: 17.1 g/dL — ABNORMAL HIGH (ref 12.0–15.0)
Lymphocytes Relative: 31 %
Lymphs Abs: 2.3 10*3/uL (ref 0.7–4.0)
MCH: 30.8 pg (ref 26.0–34.0)
MCHC: 35.1 g/dL (ref 30.0–36.0)
MCV: 87.6 fL (ref 78.0–100.0)
MONOS PCT: 9 %
Monocytes Absolute: 0.6 10*3/uL (ref 0.1–1.0)
Neutro Abs: 4.5 10*3/uL (ref 1.7–7.7)
Neutrophils Relative %: 59 %
PLATELETS: 284 10*3/uL (ref 150–400)
RBC: 5.56 MIL/uL — ABNORMAL HIGH (ref 3.87–5.11)
RDW: 12.6 % (ref 11.5–15.5)
WBC: 7.4 10*3/uL (ref 4.0–10.5)

## 2017-11-30 LAB — BASIC METABOLIC PANEL
ANION GAP: 10 (ref 5–15)
BUN: 7 mg/dL (ref 6–20)
CALCIUM: 9 mg/dL (ref 8.9–10.3)
CHLORIDE: 102 mmol/L (ref 101–111)
CO2: 24 mmol/L (ref 22–32)
Creatinine, Ser: 0.89 mg/dL (ref 0.44–1.00)
GFR calc non Af Amer: >60 mL/min (ref >60)
Glucose, Bld: 109 mg/dL — ABNORMAL HIGH (ref 65–99)
Potassium: 3.4 mmol/L — ABNORMAL LOW (ref 3.5–5.1)
Sodium: 136 mmol/L (ref 135–145)

## 2017-11-30 LAB — I-STAT BETA HCG BLOOD, ED (MC, WL, AP ONLY): I-stat hCG, quantitative: <5 m[IU]/mL (ref <5)

## 2017-11-30 LAB — TROPONIN I

## 2017-11-30 LAB — D-DIMER, QUANTITATIVE: D-Dimer, Quant: <0.27 ug/mL-FEU (ref 0.00–0.50)

## 2017-11-30 MED ORDER — MORPHINE SULFATE (PF) 4 MG/ML IV SOLN
4.0000 mg | INTRAVENOUS | Status: DC | PRN
  Administered 2017-11-30: 4 mg via INTRAVENOUS
  Filled 2017-11-30: qty 1

## 2017-11-30 MED ORDER — METHOCARBAMOL 500 MG PO TABS: 1000.0000 mg | ORAL_TABLET | Freq: Four times a day (QID) | ORAL | 0 refills | Status: DC | PRN

## 2017-11-30 MED ORDER — HYDROCODONE-ACETAMINOPHEN 5-325 MG PO TABS: ORAL_TABLET | ORAL | 0 refills | Status: DC

## 2017-11-30 MED ORDER — POTASSIUM CHLORIDE CRYS ER 20 MEQ PO TBCR
40.0000 meq | EXTENDED_RELEASE_TABLET | Freq: Once | ORAL | Status: AC
  Administered 2017-11-30: 40 meq via ORAL
  Filled 2017-11-30: qty 2

## 2017-11-30 MED ORDER — NAPROXEN 250 MG PO TABS: 250.0000 mg | ORAL_TABLET | Freq: Two times a day (BID) | ORAL | 0 refills | Status: DC | PRN

## 2017-11-30 MED ORDER — KETOROLAC TROMETHAMINE 30 MG/ML IJ SOLN
30.0000 mg | Freq: Once | INTRAMUSCULAR | Status: AC
  Administered 2017-11-30: 30 mg via INTRAVENOUS
  Filled 2017-11-30: qty 1

## 2017-11-30 NOTE — ED Triage Notes (Signed)
Pt reports that she has been coughing since November. States her left chest began hurting last night and pain became constant this morning. Hurts to cough and breathe

## 2017-11-30 NOTE — Discharge Instructions (Addendum)
Take the prescriptions as directed.  Apply moist heat or ice to the area(s) of discomfort, for 15 minutes at a time, several times per day for the next few days.  Do not fall asleep on a heating or ice pack.  Call your regular medical doctor today to schedule a follow up appointment in the next 3 days.  Return to the Emergency Department immediately if worsening.

## 2017-11-30 NOTE — ED Provider Notes (Signed)
[facility] EMERGENCY DEPARTMENT Provider Note   CSN: [id number] Arrival date & time: 11/30/17  1205     History   Chief Complaint Chief Complaint  Patient presents with  . Chest Pain    HPI Dominique Hinton is a 47 y.o. female.  HPI  Pt was seen at 1230. Per pt, c/o gradual onset and persistence of constant left chest wall "pain" that began yesterday. Pt describes the pain as "sharp," worsens with palpation of the area, coughing, and deep breathing. Pt states she has been "coughing" for the past several months. Denies palpitations, no SOB, no injury, no fevers, no rash, no abd pain, no N/V/D.   History reviewed. No pertinent past medical history.  There are no active problems to display for this patient.   Past Surgical History:  Procedure Laterality Date  . ABDOMINAL HYSTERECTOMY    . APPENDECTOMY    . BREAST SURGERY    . CHOLECYSTECTOMY    . TUBAL LIGATION       OB History    Gravida  4   Para  3   Term  3   Preterm      AB  1   Living        SAB  1   TAB      Ectopic      Multiple      Live Births               Home Medications    Prior to Admission medications   Medication Sig Start Date End Date Taking? Authorizing Provider  ibuprofen (ADVIL,MOTRIN) 200 MG tablet Take 800 mg by mouth every 6 (six) hours as needed.   Yes [provider]    Family History Family History  Problem Relation Age of Onset  . Diabetes Mother   . Alzheimer's disease Father   . Hypertension Other     Social History Social History   Tobacco Use  . Smoking status: Current Every Day Smoker    Packs/day: 1.00    Types: Cigarettes  . Smokeless tobacco: Never Used  Substance Use Topics  . Alcohol use: Yes    Comment: occasionally  . Drug use: Yes    Types: Marijuana     Allergies   Cortisone and Doxycycline   Review of Systems Review of Systems ROS: Statement: All systems negative except as marked or noted in the HPI;  Constitutional: Negative for fever and chills. ; ; Eyes: Negative for eye pain, redness and discharge. ; ; ENMT: Negative for ear pain, hoarseness, nasal congestion, sinus pressure and sore throat. ; ; Cardiovascular: Negative for palpitations, diaphoresis, dyspnea and peripheral edema. ; ; Respiratory: +cough. Negative for wheezing and stridor. ; ; Gastrointestinal: Negative for nausea, vomiting, diarrhea, abdominal pain, blood in stool, hematemesis, jaundice and rectal bleeding. . ; ; Genitourinary: Negative for dysuria, flank pain and hematuria. ; ; Musculoskeletal: +chest wall pain. Negative for back pain and neck pain. Negative for swelling and trauma.; ; Skin: Negative for pruritus, rash, abrasions, blisters, bruising and skin lesion.; ; Neuro: Negative for headache, lightheadedness and neck stiffness. Negative for weakness, altered level of consciousness, altered mental status, extremity weakness, paresthesias, involuntary movement, seizure and syncope.       Physical Exam Updated Vital Signs BP (!) 154/97   Pulse 78   Temp 98.7 F (37.1 C) (Oral)   Resp 16   Ht 5\' 4"  (1.626 m)   Wt 86.2 kg (190 lb)   SpO2  96%   BMI 32.61 kg/m     Physical Exam  1235: Physical examination:  Nursing notes reviewed; Vital signs and O2 SAT reviewed;  Constitutional: Well developed, Well nourished, Well hydrated, In no acute distress; Head:  Normocephalic, atraumatic; Eyes: EOMI, PERRL, No scleral icterus; ENMT: Mouth and pharynx normal, Mucous membranes moist; Neck: Supple, Full range of motion, No lymphadenopathy; Cardiovascular: Regular rate and rhythm, No gallop; Respiratory: Breath sounds clear & equal bilaterally, No wheezes.  Speaking full sentences with ease, Normal respiratory effort/excursion; Chest: +left mid-anterior chest wall and parasternal areas tender to palp. No deformity, no soft tissue crepitus, no rash. Movement normal; Abdomen: Soft, Nontender, Nondistended, Normal bowel sounds;  Genitourinary: No CVA tenderness; Extremities: Peripheral pulses normal, No tenderness, No edema, No calf edema or asymmetry.; Neuro: AA&Ox3, Major CN grossly intact.  Speech clear. No gross focal motor or sensory deficits in extremities.; Skin: Color normal, Warm, Dry.    ED Treatments / Results  Labs (all labs ordered are listed, but only abnormal results are displayed)   EKG EKG Interpretation  Date/Time:  Friday November 30 2017 12:15:20 EDT Ventricular Rate:  90 PR Interval:  158 QRS Duration: 80 QT Interval:  362 QTC Calculation: 442 R Axis:   49 Text Interpretation:  Normal sinus rhythm Normal ECG When compared with ECG of 10/26/2014 No significant change was found Confirmed by Francine Graven [phone]) on 11/30/2017 12:41:28 PM   Radiology   Procedures Procedures (including critical care time)  Medications Ordered in ED Medications  morphine 4 MG/ML injection 4 mg (4 mg Intravenous Given 11/30/17 1300)  ketorolac (TORADOL) 30 MG/ML injection 30 mg (30 mg Intravenous Given 11/30/17 1300)  potassium chloride SA (K-DUR,KLOR-CON) CR tablet 40 mEq (40 mEq Oral Given 11/30/17 1417)     Initial Impression / Assessment and Plan / ED Course  I have reviewed the triage vital signs and the nursing notes.  Pertinent labs & imaging results that were available during my care of the patient were reviewed by me and considered in my medical decision making (see chart for details).  MDM Reviewed: previous chart, nursing note and vitals Reviewed previous: labs and ECG Interpretation: labs, ECG and x-ray   Results for orders placed or performed during the hospital encounter of 47/42/59  Basic metabolic panel  Result Value Ref Range   Sodium 136 135 - 145 mmol/L   Potassium 3.4 (L) 3.5 - 5.1 mmol/L   Chloride 102 101 - 111 mmol/L   CO2 24 22 - 32 mmol/L   Glucose, Bld 109 (H) 65 - 99 mg/dL   BUN 7 6 - 20 mg/dL   Creatinine, Ser 0.89 0.44 - 1.00 mg/dL   Calcium 9.0 8.9 - 10.3 mg/dL    GFR calc non Af Amer >60 >60 mL/min   GFR calc Af Amer >60 >60 mL/min   Anion gap 10 5 - 15  Troponin I  Result Value Ref Range   Troponin I <0.03 <0.03 ng/mL  D-dimer, quantitative  Result Value Ref Range   D-Dimer, Quant <0.27 0.00 - 0.50 ug/mL-FEU  CBC with Differential  Result Value Ref Range   WBC 7.4 4.0 - 10.5 K/uL   RBC 5.56 (H) 3.87 - 5.11 MIL/uL   Hemoglobin 17.1 (H) 12.0 - 15.0 g/dL   HCT 48.7 (H) 36.0 - 46.0 %   MCV 87.6 78.0 - 100.0 fL   MCH 30.8 26.0 - 34.0 pg   MCHC 35.1 30.0 - 36.0 g/dL   RDW 12.6 11.5 -  15.5 %   Platelets 284 150 - 400 K/uL   Neutrophils Relative % 59 %   Neutro Abs 4.5 1.7 - 7.7 K/uL   Lymphocytes Relative 31 %   Lymphs Abs 2.3 0.7 - 4.0 K/uL   Monocytes Relative 9 %   Monocytes Absolute 0.6 0.1 - 1.0 K/uL   Eosinophils Relative 1 %   Eosinophils Absolute 0.1 0.0 - 0.7 K/uL   Basophils Relative 0 %   Basophils Absolute 0.0 0.0 - 0.1 K/uL  I-Stat beta hCG blood, ED  Result Value Ref Range   I-stat hCG, quantitative <5.0 <5 mIU/mL   Comment 3           Dg Chest 2 View Result Date: 11/30/2017 CLINICAL DATA:  Left-sided chest pain since last evening. EXAM: CHEST - 2 VIEW COMPARISON:  10/24/2014 FINDINGS: The cardiac silhouette, mediastinal and hilar contours are within normal limits and stable. The lungs are clear. No pleural effusion. The bony thorax is intact. IMPRESSION: No acute cardiopulmonary findings. Electronically Signed   By: Marijo Sanes M.D.   On: 11/30/2017 13:39    1435:  Potassium repleted PO. Workup reassuring. Doubt PE as cause for symptoms with normal d-dimer and low risk [place].  Doubt ACS as cause for symptoms with normal troponin and unchanged EKG from previous after 2 days of constant symptoms. Tx symptomatically at this time. Dx and testing d/w pt.  Questions answered.  Verb understanding, agreeable to d/c home with outpt f/u.   Final Clinical Impressions(s) / ED Diagnoses   Final diagnoses:  None    ED Discharge  Orders    None       Francine Graven, DO 12/02/17 1444

## 2018-02-10 ENCOUNTER — Emergency Department (HOSPITAL_COMMUNITY): Payer: Self-pay

## 2018-02-10 ENCOUNTER — Encounter (HOSPITAL_COMMUNITY): Payer: Self-pay | Admitting: *Deleted

## 2018-02-10 ENCOUNTER — Emergency Department (HOSPITAL_COMMUNITY)
Admission: EM | Admit: 2018-02-10 | Discharge: 2018-02-10 | Disposition: A | Discharge status: Home / Self Care | Payer: Self-pay | Attending: Emergency Medicine | Admitting: Emergency Medicine

## 2018-02-10 ENCOUNTER — Other Ambulatory Visit: Payer: Self-pay

## 2018-02-10 DIAGNOSIS — Z79899 Other long term (current) drug therapy: Secondary | ICD-10-CM | POA: Insufficient documentation

## 2018-02-10 DIAGNOSIS — M79671 Pain in right foot: Secondary | ICD-10-CM | POA: Insufficient documentation

## 2018-02-10 DIAGNOSIS — F17210 Nicotine dependence, cigarettes, uncomplicated: Secondary | ICD-10-CM | POA: Insufficient documentation

## 2018-02-10 DIAGNOSIS — R609 Edema, unspecified: Secondary | ICD-10-CM | POA: Insufficient documentation

## 2018-02-10 DIAGNOSIS — I10 Essential (primary) hypertension: Secondary | ICD-10-CM | POA: Insufficient documentation

## 2018-02-10 HISTORY — DX: Anxiety disorder, unspecified: F41.9

## 2018-02-10 HISTORY — DX: Unspecified osteoarthritis, unspecified site: M19.90

## 2018-02-10 HISTORY — DX: Essential (primary) hypertension: I10

## 2018-02-10 MED ORDER — TRAMADOL HCL 50 MG PO TABS: 50.0000 mg | ORAL_TABLET | Freq: Four times a day (QID) | ORAL | 0 refills | Status: DC | PRN

## 2018-02-10 MED ORDER — TRAMADOL HCL 50 MG PO TABS
50.0000 mg | ORAL_TABLET | Freq: Once | ORAL | Status: AC
  Administered 2018-02-10: 50 mg via ORAL
  Filled 2018-02-10: qty 1

## 2018-02-10 MED ORDER — IBUPROFEN 800 MG PO TABS: 800.0000 mg | ORAL_TABLET | Freq: Three times a day (TID) | ORAL | 0 refills | Status: DC

## 2018-02-10 NOTE — ED Provider Notes (Signed)
[facility] EMERGENCY DEPARTMENT Provider Note   CSN: [id number] Arrival date & time: 02/10/18  4098     History   Chief Complaint Chief Complaint  Patient presents with  . Foot Pain    HPI Dominique Hinton is a 47 y.o. female presenting with pain and bruising of her right dorsal foot which started 4 days ago.  Her pain is constant but worse with weightbearing, no radiation.  She denies injury.  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  She has used rest, Tylenol and ibuprofen without relief of symptoms.  She denies numbness in her foot or toes.  She has found no alleviators for her symptoms.  The history is provided by the patient.    Past Medical History:  Diagnosis Date  . Anxiety   . Arthritis   . Hypertension     There are no active problems to display for this patient.   Past Surgical History:  Procedure Laterality Date  . ABDOMINAL HYSTERECTOMY    . APPENDECTOMY    . BREAST SURGERY    . CHOLECYSTECTOMY    . TUBAL LIGATION       OB History    Gravida  4   Para  3   Term  3   Preterm      AB  1   Living        SAB  1   TAB      Ectopic      Multiple      Live Births               Home Medications    Prior to Admission medications   Medication Sig Start Date End Date Taking? Authorizing Provider  citalopram (CELEXA) 20 MG tablet Take 20 mg by mouth daily. 01/21/18   [provider]  HYDROcodone-acetaminophen (NORCO/VICODIN) 5-325 MG tablet 1 or 2 tabs PO q6 hours prn pain 11/30/17   Francine Graven, DO  ibuprofen (ADVIL,MOTRIN) 800 MG tablet Take 1 tablet (800 mg total) by mouth 3 (three) times daily. 02/10/18   Evalee Jefferson, PA-C  lisinopril (PRINIVIL,ZESTRIL) 40 MG tablet  Take 40 mg by mouth daily. 01/21/18   [provider]  methocarbamol (ROBAXIN) 500 MG tablet Take 2 tablets (1,000 mg total) by mouth 4 (four) times daily as needed for muscle spasms (muscle spasm/pain). 11/30/17   Francine Graven, DO  naproxen (NAPROSYN) 250 MG tablet Take 1 tablet (250 mg total) by mouth 2 (two) times daily as needed for mild pain or moderate pain (take with food). 11/30/17   Francine Graven, DO  traMADol (ULTRAM) 50 MG tablet Take 1 tablet (50 mg total) by mouth every 6 (six) hours as needed. 02/10/18   Evalee Jefferson, PA-C    Family History Family History  Problem Relation Age of Onset  . Diabetes Mother   . Alzheimer's disease Father   . Hypertension Other     Social History Social History   Tobacco Use  . Smoking status: Current Every Day Smoker    Packs/day: 1.00    Types: Cigarettes  . Smokeless tobacco: Never Used  Substance Use Topics  . Alcohol use: Yes    Comment:  occasionally  . Drug use: Yes    Types: Marijuana     Allergies   Cortisone and Doxycycline   Review of Systems Review of Systems  Constitutional: Negative for fever.  Musculoskeletal: Positive for arthralgias and joint swelling. Negative for myalgias.  Neurological: Negative for weakness and numbness.     Physical Exam Updated Vital Signs BP 136/90 (BP Location: Left Arm)   Pulse (!) 54   Temp 98 F (36.7 C) (Oral)   Resp 16   Ht 5\' 6"  (1.676 m)   Wt 87.1 kg (192 lb)   SpO2 98%   BMI 30.99 kg/m   Physical Exam  Constitutional: She appears well-developed and well-nourished.  HENT:  Head: Atraumatic.  Neck: Normal range of motion.  Cardiovascular:  Pulses equal bilaterally  Musculoskeletal: She exhibits edema and tenderness.       Feet:  Tenderness to palpation of the dorsal right midfoot radiating to her distal MTPs, predominantly the third and fourth which also have very faint bruising noted at the base of these toes.  Distal sensation is intact.    Neurological: She is alert. She has normal strength. She displays normal reflexes. No sensory deficit.  Skin: Skin is warm and dry.  Psychiatric: She has a normal mood and affect.     ED Treatments / Results  Labs (all labs ordered are listed, but only abnormal results are displayed) Labs Reviewed - No data to display  EKG None  Radiology Dg Foot Complete Right  Result Date: 02/10/2018 CLINICAL DATA:  Foot pain and bruising for 4 days. Initial encounter. EXAM: RIGHT FOOT COMPLETE - 3+ VIEW COMPARISON:  None. FINDINGS: There is no evidence of fracture or dislocation. There is no evidence of arthropathy. Large plantar calcaneal bone spurs noted. Soft tissues are unremarkable. IMPRESSION: No acute findings. Large plantar calcaneal bone spur. Electronically Signed   By: Earle Gell M.D.   On: 02/10/2018 11:08    Procedures Procedures (including critical care time)  Medications Ordered in ED Medications  traMADol (ULTRAM) tablet 50 mg (50 mg Oral Given 02/10/18 1119)     Initial Impression / Assessment and Plan / ED Course  I have reviewed the triage vital signs and the nursing notes.  Pertinent labs & imaging results that were available during my care of the patient were reviewed by me and considered in my medical decision making (see chart for details).     Imaging was reviewed and discussed with patient.  No x-ray findings to explain the location of patient's pain.  I suspect she has a foot contusion.  She was placed in a Watson-Jones dressing, postop shoe given.  She still had difficulty with weightbearing so crutches were provided her.  We discussed heat, elevation, activity as tolerated.  She was prescribed tramadol for pain relief.  Advised follow-up with her PCP for recheck if symptoms persist or worsen.  [place] narcotic database was reviewed prior to discharge home.  Final Clinical Impressions(s) / ED Diagnoses   Final diagnoses:  Foot pain, right    ED  Discharge Orders        Ordered    traMADol (ULTRAM) 50 MG tablet  Every 6 hours PRN     02/10/18 1125    ibuprofen (ADVIL,MOTRIN) 800 MG tablet  3 times daily     02/10/18 [place], Korra Christine, PA-C 02/10/18 1630    Nat Christen, MD 02/10/18 [phone]

## 2018-02-10 NOTE — ED Notes (Signed)
EDP at beside updating patient.

## 2018-02-10 NOTE — Discharge Instructions (Addendum)
Your xrays are negative today for any bony injury, stress fracture or dislocation.  Use the padded Ace wrap for comfort, minimize weightbearing, ice and elevate your foot is much as possible over the next several days.  Use the medications as prescribed.  Follow-up with your primary doctor for recheck if your symptoms persist despite today's treatment.

## 2018-02-10 NOTE — ED Triage Notes (Signed)
Pt c/o right foot pain x 4 days. Pt has arthritis in her left foot and has been using her right foot more to ambulate. Pt says she feels like she has "overcompensated" by using her right foot so much. Pt has bruising to top of right foot. Denies fall, injury.

## 2018-04-04 ENCOUNTER — Other Ambulatory Visit: Payer: Self-pay

## 2018-04-04 ENCOUNTER — Emergency Department (HOSPITAL_COMMUNITY): Payer: Self-pay

## 2018-04-04 ENCOUNTER — Encounter (HOSPITAL_COMMUNITY): Payer: Self-pay | Admitting: Emergency Medicine

## 2018-04-04 ENCOUNTER — Emergency Department (HOSPITAL_COMMUNITY)
Admission: EM | Admit: 2018-04-04 | Discharge: 2018-04-05 | Disposition: A | Discharge status: Home / Self Care | Payer: Self-pay | Attending: Emergency Medicine | Admitting: Emergency Medicine

## 2018-04-04 DIAGNOSIS — I10 Essential (primary) hypertension: Secondary | ICD-10-CM | POA: Insufficient documentation

## 2018-04-04 DIAGNOSIS — E876 Hypokalemia: Secondary | ICD-10-CM | POA: Insufficient documentation

## 2018-04-04 DIAGNOSIS — Z79899 Other long term (current) drug therapy: Secondary | ICD-10-CM | POA: Insufficient documentation

## 2018-04-04 DIAGNOSIS — R1030 Lower abdominal pain, unspecified: Secondary | ICD-10-CM | POA: Insufficient documentation

## 2018-04-04 DIAGNOSIS — F17210 Nicotine dependence, cigarettes, uncomplicated: Secondary | ICD-10-CM | POA: Insufficient documentation

## 2018-04-04 LAB — URINALYSIS, ROUTINE W REFLEX MICROSCOPIC
Bilirubin Urine: NEGATIVE
Glucose, UA: NEGATIVE mg/dL
Hgb urine dipstick: NEGATIVE
Ketones, ur: NEGATIVE mg/dL
Leukocytes, UA: NEGATIVE
NITRITE: NEGATIVE
Protein, ur: NEGATIVE mg/dL
SPECIFIC GRAVITY, URINE: 1.002 — AB (ref 1.005–1.030)
pH: 7 (ref 5.0–8.0)

## 2018-04-04 LAB — CBC
HCT: 43.9 % (ref 36.0–46.0)
HEMOGLOBIN: 15.7 g/dL — AB (ref 12.0–15.0)
MCH: 31.2 pg (ref 26.0–34.0)
MCHC: 35.8 g/dL (ref 30.0–36.0)
MCV: 87.1 fL (ref 78.0–100.0)
Platelets: 353 10*3/uL (ref 150–400)
RBC: 5.04 MIL/uL (ref 3.87–5.11)
RDW: 12.7 % (ref 11.5–15.5)
WBC: 8.4 10*3/uL (ref 4.0–10.5)

## 2018-04-04 LAB — COMPREHENSIVE METABOLIC PANEL
ALT: 25 U/L (ref 0–44)
ANION GAP: 11 (ref 5–15)
AST: 22 U/L (ref 15–41)
Albumin: 3.9 g/dL (ref 3.5–5.0)
Alkaline Phosphatase: 71 U/L (ref 38–126)
BUN: 7 mg/dL (ref 6–20)
CALCIUM: 9 mg/dL (ref 8.9–10.3)
CO2: 24 mmol/L (ref 22–32)
Chloride: 101 mmol/L (ref 98–111)
Creatinine, Ser: 0.82 mg/dL (ref 0.44–1.00)
GFR calc non Af Amer: >60 mL/min (ref >60)
Glucose, Bld: 91 mg/dL (ref 70–99)
Potassium: 2.7 mmol/L — CL (ref 3.5–5.1)
Sodium: 136 mmol/L (ref 135–145)
TOTAL PROTEIN: 7.3 g/dL (ref 6.5–8.1)
Total Bilirubin: 0.6 mg/dL (ref 0.3–1.2)

## 2018-04-04 LAB — LIPASE, BLOOD: Lipase: 33 U/L (ref 11–51)

## 2018-04-04 LAB — PREGNANCY, URINE: PREG TEST UR: NEGATIVE

## 2018-04-04 MED ORDER — MORPHINE SULFATE (PF) 4 MG/ML IV SOLN
4.0000 mg | Freq: Once | INTRAVENOUS | Status: DC
  Filled 2018-04-04: qty 1

## 2018-04-04 MED ORDER — DICYCLOMINE HCL 10 MG/ML IM SOLN
20.0000 mg | Freq: Once | INTRAMUSCULAR | Status: DC
  Filled 2018-04-04: qty 2

## 2018-04-04 MED ORDER — ONDANSETRON HCL 4 MG/2ML IJ SOLN
4.0000 mg | Freq: Once | INTRAMUSCULAR | Status: AC
  Administered 2018-04-04: 4 mg via INTRAVENOUS
  Filled 2018-04-04: qty 2

## 2018-04-04 MED ORDER — SODIUM CHLORIDE 0.9 % IV BOLUS
500.0000 mL | Freq: Once | INTRAVENOUS | Status: AC
  Administered 2018-04-04: 500 mL via INTRAVENOUS

## 2018-04-04 MED ORDER — KETOROLAC TROMETHAMINE 30 MG/ML IJ SOLN
30.0000 mg | Freq: Once | INTRAMUSCULAR | Status: AC
  Administered 2018-04-05: 30 mg via INTRAVENOUS
  Filled 2018-04-04: qty 1

## 2018-04-04 MED ORDER — POTASSIUM CHLORIDE CRYS ER 20 MEQ PO TBCR
40.0000 meq | EXTENDED_RELEASE_TABLET | Freq: Once | ORAL | Status: AC
  Administered 2018-04-04: 40 meq via ORAL
  Filled 2018-04-04: qty 2

## 2018-04-04 NOTE — ED Provider Notes (Signed)
[facility] EMERGENCY DEPARTMENT Provider Note   CSN: [id number] Arrival date & time: 04/04/18  1910     History   Chief Complaint Chief Complaint  Patient presents with  . Abdominal Pain    HPI Dominique Hinton is a 47 y.o. female.  Patient presents to the emergency department for evaluation of abdominal pain.  Patient reports that she has been experiencing intermittent episodes of cramping, usually once a month, for very long time.  Over the last 6 months, symptoms have worsened.  Today she experienced severe, sharp and stabbing cramping across the lower abdomen bilaterally.  No nausea, vomiting or diarrhea.  She reports that she has had some improvement since she was given her potassium tablets for low potassium here in the ER.  Patient reports that she has had these problems since she had a hysterectomy many years ago.     Past Medical History:  Diagnosis Date  . Anxiety   . Arthritis   . Hypertension     There are no active problems to display for this patient.   Past Surgical History:  Procedure Laterality Date  . ABDOMINAL HYSTERECTOMY    . APPENDECTOMY    . BREAST SURGERY    . CHOLECYSTECTOMY    . TUBAL LIGATION       OB History    Gravida  4   Para  3   Term  3   Preterm      AB  1   Living        SAB  1   TAB      Ectopic      Multiple      Live Births               Home Medications    Prior to Admission medications   Medication Sig Start Date End Date Taking? Authorizing Provider  citalopram (CELEXA) 20 MG tablet Take 20 mg by mouth daily. 01/21/18   [provider]  dicyclomine (BENTYL) 20 MG tablet Take 1 tablet (20 mg total) by mouth 2 (two) times daily. 04/05/18   Orpah Greek, MD  HYDROcodone-acetaminophen (NORCO/VICODIN) 5-325 MG tablet 1 or 2 tabs PO q6 hours prn pain 11/30/17   Francine Graven, DO  ibuprofen (ADVIL,MOTRIN) 800 MG tablet Take 1 tablet (800 mg total) by mouth 3 (three) times daily.  02/10/18   Evalee Jefferson, PA-C  lisinopril (PRINIVIL,ZESTRIL) 40 MG tablet Take 40 mg by mouth daily. 01/21/18   [provider]  methocarbamol (ROBAXIN) 500 MG tablet Take 2 tablets (1,000 mg total) by mouth 4 (four) times daily as needed for muscle spasms (muscle spasm/pain). 11/30/17   Francine Graven, DO  naproxen (NAPROSYN) 250 MG tablet Take 1 tablet (250 mg total) by mouth 2 (two) times daily as needed for mild pain or moderate pain (take with food). 11/30/17   Francine Graven, DO  potassium chloride SA (K-DUR,KLOR-CON) 20 MEQ tablet Take 1 tablet (20 mEq total) by mouth daily. 04/05/18   Orpah Greek, MD  traMADol (ULTRAM) 50 MG tablet Take 1 tablet (50 mg total) by mouth every 6 (six) hours as needed. 02/10/18   Evalee Jefferson, PA-C    Family History Family History  Problem Relation Age of Onset  . Diabetes Mother   . Alzheimer's disease Father   . Hypertension Other     Social History Social History   Tobacco Use  . Smoking status: Current Every Day Smoker    Packs/day: 1.00  Types: Cigarettes  . Smokeless tobacco: Never Used  Substance Use Topics  . Alcohol use: Yes    Comment: occasionally  . Drug use: Yes    Types: Marijuana     Allergies   Cortisone and Doxycycline   Review of Systems Review of Systems  Gastrointestinal: Positive for abdominal pain. Negative for constipation, diarrhea, nausea and vomiting.  All other systems reviewed and are negative.    Physical Exam Updated Vital Signs BP 116/90 (BP Location: Right Arm)   Pulse 92   Temp 98.7 F (37.1 C) (Oral)   Resp (!) 22   Wt 87.1 kg (192 lb)   SpO2 98%   BMI 30.99 kg/m   Physical Exam  Constitutional: She is oriented to person, place, and time. She appears well-developed and well-nourished. No distress.  HENT:  Head: Normocephalic and atraumatic.  Right Ear: Hearing normal.  Left Ear: Hearing normal.  Nose: Nose normal.  Mouth/Throat: Oropharynx is clear and moist and  mucous membranes are normal.  Eyes: Pupils are equal, round, and reactive to light. Conjunctivae and EOM are normal.  Neck: Normal range of motion. Neck supple.  Cardiovascular: Regular rhythm, S1 normal and S2 normal. Exam reveals no gallop and no friction rub.  No murmur heard. Pulmonary/Chest: Effort normal and breath sounds normal. No respiratory distress. She exhibits no tenderness.  Abdominal: Soft. Normal appearance and bowel sounds are normal. There is no hepatosplenomegaly. There is no tenderness. There is no rebound, no guarding, no tenderness at McBurney's point and negative Murphy's sign. No hernia.  Musculoskeletal: Normal range of motion.  Neurological: She is alert and oriented to person, place, and time. She has normal strength. No cranial nerve deficit or sensory deficit. Coordination normal. GCS eye subscore is 4. GCS verbal subscore is 5. GCS motor subscore is 6.  Skin: Skin is warm, dry and intact. No rash noted. No cyanosis.  Psychiatric: She has a normal mood and affect. Her speech is normal and behavior is normal. Thought content normal.  Nursing note and vitals reviewed.    ED Treatments / Results  Labs (all labs ordered are listed, but only abnormal results are displayed) Labs Reviewed  COMPREHENSIVE METABOLIC PANEL - Abnormal; Notable for the following components:      Result Value   Potassium 2.7 (*)    All other components within normal limits  CBC - Abnormal; Notable for the following components:   Hemoglobin 15.7 (*)    All other components within normal limits  URINALYSIS, ROUTINE W REFLEX MICROSCOPIC - Abnormal; Notable for the following components:   Color, Urine COLORLESS (*)    Specific Gravity, Urine 1.002 (*)    All other components within normal limits  LIPASE, BLOOD  PREGNANCY, URINE    EKG None  Radiology Dg Abd Acute W/chest  Result Date: 04/05/2018 CLINICAL DATA:  Intermittent lower abdominal pain for 6 months. History of  cholecystectomy, appendectomy and hysterectomy. Smoker. EXAM: DG ABDOMEN ACUTE W/ 1V CHEST COMPARISON:  Chest radiograph November 30, 2017 FINDINGS: Cardiomediastinal silhouette is normal. Lungs are clear, no pleural effusions. No pneumothorax. Soft tissue planes and included osseous structures are nonacute. Old LEFT mid clavicle fracture. Bowel gas pattern is nondilated and nonobstructive. Surgical clips in the included right abdomen compatible with cholecystectomy. No intra-abdominal mass effect, pathologic calcifications or free air. Phleboliths in the pelvis. Soft tissue planes and included osseous structures are non-suspicious. IMPRESSION: Negative abdominal radiographs.  No acute cardiopulmonary disease. Electronically Signed   By: Elon Alas  M.D.   On: 04/05/2018 00:39    Procedures Procedures (including critical care time)  Medications Ordered in ED Medications  morphine 4 MG/ML injection 4 mg (4 mg Intravenous Refused 04/04/18 2348)  potassium chloride SA (K-DUR,KLOR-CON) CR tablet 40 mEq (has no administration in time range)  potassium chloride SA (K-DUR,KLOR-CON) CR tablet 40 mEq (40 mEq Oral Given 04/04/18 2113)  sodium chloride 0.9 % bolus 500 mL (0 mLs Intravenous Stopped 04/05/18 0038)  ondansetron (ZOFRAN) injection 4 mg (4 mg Intravenous Given 04/04/18 2337)  ketorolac (TORADOL) 30 MG/ML injection 30 mg (30 mg Intravenous Given 04/05/18 0040)  dicyclomine (BENTYL) capsule 10 mg (10 mg Oral Given 04/05/18 0045)     Initial Impression / Assessment and Plan / ED Course  I have reviewed the triage vital signs and the nursing notes.  Pertinent labs & imaging results that were available during my care of the patient were reviewed by me and considered in my medical decision making (see chart for details).     Patient presents to the emergency department for evaluation of abdominal pain.  Patient has been experiencing this for many years.  She reports worsening of the pain over the last 6  months and then today had a prolonged period of sharp and stabbing pain, bilaterally in the low abdominal region.  Patient is status post hysterectomy.  She still has ovaries.  This apparently happens once a month, therefore unlikely to be ovarian torsion.  Examination does not reveal any significant guarding, no signs of peritoneal signs.  Lab work is unremarkable.  I do not have any concern for an acute surgical process.  Symptoms are equal bilaterally, but she does not have any significant tenderness at this time.  She reports that she was told she has "a lot of scar tissue" by Dr. Glo Herring when he did her hysterectomy years ago.  An x-ray was therefore performed to evaluate for the possibility of obstruction.  X-ray is unremarkable, no obstruction, no abnormal air patterns, no constipation.  At this point believe patient can be worked up as an outpatient for this chronic problem.  Will refer back to primary care doctor and gastroenterology.  Treat with analgesia.   Patient with hypokalemia here in the ER.  This is been seen before.  She was given 2 doses of potassium here in the ER and will be given a week of potassium supplementation.  Final Clinical Impressions(s) / ED Diagnoses   Final diagnoses:  Lower abdominal pain  Hypokalemia    ED Discharge Orders        Ordered    potassium chloride SA (K-DUR,KLOR-CON) 20 MEQ tablet  Daily     04/05/18 0058    dicyclomine (BENTYL) 20 MG tablet  2 times daily     04/05/18 0058       Orpah Greek, MD 04/05/18 [phone]

## 2018-04-04 NOTE — ED Triage Notes (Signed)
Pt C/O lower abdominal pain that started around 1200 today. Pt denies any N/V or diarrhea. Pt denies any urinary symptoms. Pt denies increased pain with palpation.

## 2018-04-04 NOTE — ED Notes (Signed)
Date and time results received: 04/04/18 2100 (use smartphrase ".now" to insert current time)  Test: potassium Critical Value: 2.7  Name of Provider Notified: dr Roxanne Mins  Orders Received? Or Actions Taken?: Actions Taken: orders received

## 2018-04-05 MED ORDER — HYDROCODONE-ACETAMINOPHEN 5-325 MG PO TABS: 1.0000 | ORAL_TABLET | ORAL | 0 refills | Status: DC | PRN

## 2018-04-05 MED ORDER — POTASSIUM CHLORIDE CRYS ER 20 MEQ PO TBCR
40.0000 meq | EXTENDED_RELEASE_TABLET | Freq: Once | ORAL | Status: AC
  Administered 2018-04-05: 40 meq via ORAL
  Filled 2018-04-05: qty 2

## 2018-04-05 MED ORDER — DICYCLOMINE HCL 20 MG PO TABS: 20.0000 mg | ORAL_TABLET | Freq: Two times a day (BID) | ORAL | 0 refills | Status: DC

## 2018-04-05 MED ORDER — DICYCLOMINE HCL 10 MG PO CAPS
10.0000 mg | ORAL_CAPSULE | Freq: Once | ORAL | Status: AC
  Administered 2018-04-05: 10 mg via ORAL
  Filled 2018-04-05: qty 1

## 2018-04-05 MED ORDER — POTASSIUM CHLORIDE CRYS ER 20 MEQ PO TBCR: 20.0000 meq | EXTENDED_RELEASE_TABLET | Freq: Every day | ORAL | 0 refills | Status: DC

## 2018-04-08 MED FILL — Hydrocodone-Acetaminophen Tab 5-325 MG: ORAL | Qty: 6 | Status: AC

## 2018-06-04 ENCOUNTER — Emergency Department (HOSPITAL_COMMUNITY): Payer: Self-pay

## 2018-06-04 ENCOUNTER — Other Ambulatory Visit: Payer: Self-pay

## 2018-06-04 ENCOUNTER — Emergency Department (HOSPITAL_COMMUNITY)
Admission: EM | Admit: 2018-06-04 | Discharge: 2018-06-04 | Disposition: A | Discharge status: Home / Self Care | Payer: Self-pay | Attending: Emergency Medicine | Admitting: Emergency Medicine

## 2018-06-04 ENCOUNTER — Encounter (HOSPITAL_COMMUNITY): Payer: Self-pay | Admitting: Emergency Medicine

## 2018-06-04 DIAGNOSIS — I10 Essential (primary) hypertension: Secondary | ICD-10-CM | POA: Insufficient documentation

## 2018-06-04 DIAGNOSIS — R0602 Shortness of breath: Secondary | ICD-10-CM | POA: Insufficient documentation

## 2018-06-04 DIAGNOSIS — Z79899 Other long term (current) drug therapy: Secondary | ICD-10-CM | POA: Insufficient documentation

## 2018-06-04 DIAGNOSIS — R079 Chest pain, unspecified: Secondary | ICD-10-CM | POA: Insufficient documentation

## 2018-06-04 DIAGNOSIS — E876 Hypokalemia: Secondary | ICD-10-CM | POA: Insufficient documentation

## 2018-06-04 DIAGNOSIS — R531 Weakness: Secondary | ICD-10-CM | POA: Insufficient documentation

## 2018-06-04 DIAGNOSIS — I959 Hypotension, unspecified: Secondary | ICD-10-CM | POA: Insufficient documentation

## 2018-06-04 DIAGNOSIS — F17210 Nicotine dependence, cigarettes, uncomplicated: Secondary | ICD-10-CM | POA: Insufficient documentation

## 2018-06-04 LAB — COMPREHENSIVE METABOLIC PANEL
ALBUMIN: 3.5 g/dL (ref 3.5–5.0)
ALK PHOS: 54 U/L (ref 38–126)
ALT: 25 U/L (ref 0–44)
ANION GAP: 10 (ref 5–15)
AST: 25 U/L (ref 15–41)
BILIRUBIN TOTAL: 1.3 mg/dL — AB (ref 0.3–1.2)
BUN: 10 mg/dL (ref 6–20)
CALCIUM: 8.5 mg/dL — AB (ref 8.9–10.3)
CO2: 20 mmol/L — ABNORMAL LOW (ref 22–32)
Chloride: 104 mmol/L (ref 98–111)
Creatinine, Ser: 1.41 mg/dL — ABNORMAL HIGH (ref 0.44–1.00)
GFR calc Af Amer: 51 mL/min — ABNORMAL LOW (ref >60)
GFR calc non Af Amer: 44 mL/min — ABNORMAL LOW (ref >60)
GLUCOSE: 94 mg/dL (ref 70–99)
POTASSIUM: 2.4 mmol/L — AB (ref 3.5–5.1)
Sodium: 134 mmol/L — ABNORMAL LOW (ref 135–145)
TOTAL PROTEIN: 6.8 g/dL (ref 6.5–8.1)

## 2018-06-04 LAB — CBC WITH DIFFERENTIAL/PLATELET
Basophils Absolute: 0 10*3/uL (ref 0.0–0.1)
Basophils Relative: 0 %
Eosinophils Absolute: 0 10*3/uL (ref 0.0–0.7)
Eosinophils Relative: 1 %
HCT: 41.1 % (ref 36.0–46.0)
HEMOGLOBIN: 14.8 g/dL (ref 12.0–15.0)
LYMPHS ABS: 2.8 10*3/uL (ref 0.7–4.0)
LYMPHS PCT: 36 %
MCH: 31.7 pg (ref 26.0–34.0)
MCHC: 36 g/dL (ref 30.0–36.0)
MCV: 88 fL (ref 78.0–100.0)
Monocytes Absolute: 0.8 10*3/uL (ref 0.1–1.0)
Monocytes Relative: 10 %
NEUTROS ABS: 4.2 10*3/uL (ref 1.7–7.7)
NEUTROS PCT: 53 %
Platelets: 314 10*3/uL (ref 150–400)
RBC: 4.67 MIL/uL (ref 3.87–5.11)
RDW: 13.5 % (ref 11.5–15.5)
WBC: 7.9 10*3/uL (ref 4.0–10.5)

## 2018-06-04 LAB — TROPONIN I

## 2018-06-04 MED ORDER — POTASSIUM CHLORIDE CRYS ER 20 MEQ PO TBCR
40.0000 meq | EXTENDED_RELEASE_TABLET | Freq: Once | ORAL | Status: AC
  Administered 2018-06-04: 40 meq via ORAL
  Filled 2018-06-04: qty 2

## 2018-06-04 MED ORDER — TRAMADOL HCL 50 MG PO TABS
50.0000 mg | ORAL_TABLET | Freq: Once | ORAL | Status: AC
  Administered 2018-06-04: 50 mg via ORAL
  Filled 2018-06-04: qty 1

## 2018-06-04 MED ORDER — POTASSIUM CHLORIDE CRYS ER 20 MEQ PO TBCR: 20.0000 meq | EXTENDED_RELEASE_TABLET | Freq: Every day | ORAL | 0 refills | Status: DC

## 2018-06-04 MED ORDER — ACETAMINOPHEN 325 MG PO TABS
650.0000 mg | ORAL_TABLET | Freq: Once | ORAL | Status: AC
  Administered 2018-06-04: 650 mg via ORAL
  Filled 2018-06-04: qty 2

## 2018-06-04 MED ORDER — POTASSIUM CHLORIDE 10 MEQ/100ML IV SOLN
10.0000 meq | INTRAVENOUS | Status: AC
  Administered 2018-06-04 (×2): 10 meq via INTRAVENOUS
  Filled 2018-06-04 (×2): qty 100

## 2018-06-04 NOTE — ED Notes (Signed)
Date and time results received: 06/04/18 1505 (use smartphrase ".now" to insert current time)  Test: k+ Critical Value: 2.4  Name of Provider Notified: wentz  Orders Received? Or Actions Taken?: see orders

## 2018-06-04 NOTE — Discharge Instructions (Addendum)
Make sure you are eating 3 meals a day and try to eat foods which contain plenty of potassium.  Also drink a lot of water daily.  Consider stopping smoking.  We are prescribing potassium to use for about a week to improve your level, since it can contribute to weakness.  You will need to have this rechecked and followed closely by your doctor.  Return here, if needed, for problems.

## 2018-06-04 NOTE — ED Notes (Signed)
Pt walked to the bathroom without distress 

## 2018-06-04 NOTE — ED Provider Notes (Signed)
[facility] EMERGENCY DEPARTMENT Provider Note   CSN: [id number] Arrival date & time: 06/04/18  1319     History   Chief Complaint Chief Complaint  Patient presents with  . Weakness  . Chest Pain    HPI Dominique Hinton is a 47 y.o. female.  HPI   She presents for evaluation of general weakness, feeling hot, tingling fingers, and feeling "limp," while at work today.  She denies vomiting, anorexia, change in medications, diarrhea, or rash.  She has noticed some chest pain or shortness of breath intermittently today which tends to occur when she stands.  The symptoms resolve when she lies down.  No prior similar problem.  There are no other known modifying factors.  Past Medical History:  Diagnosis Date  . Anxiety   . Arthritis   . Hypertension     There are no active problems to display for this patient.   Past Surgical History:  Procedure Laterality Date  . ABDOMINAL HYSTERECTOMY    . APPENDECTOMY    . BREAST SURGERY    . CHOLECYSTECTOMY    . TUBAL LIGATION       OB History    Gravida  4   Para  3   Term  3   Preterm      AB  1   Living        SAB  1   TAB      Ectopic      Multiple      Live Births               Home Medications    Prior to Admission medications   Medication Sig Start Date End Date Taking? Authorizing Provider  citalopram (CELEXA) 20 MG tablet Take 20 mg by mouth daily. 01/21/18  Yes [provider]  losartan-hydrochlorothiazide (HYZAAR) 100-25 MG tablet Take 1 tablet by mouth daily. 03/06/18  Yes [provider]  dicyclomine (BENTYL) 20 MG tablet Take 1 tablet (20 mg total) by mouth 2 (two) times daily. Patient not taking: Reported on 06/04/2018 04/05/18   Orpah Greek, MD  HYDROcodone-acetaminophen (NORCO/VICODIN) 5-325 MG tablet Take 1-2 tablets by mouth every 4 (four) hours as needed. Patient not taking: Reported on 06/04/2018 04/05/18   Orpah Greek, MD  potassium chloride SA  (K-DUR,KLOR-CON) 20 MEQ tablet Take 1 tablet (20 mEq total) by mouth daily. 06/04/18   Daleen Bo, MD    Family History Family History  Problem Relation Age of Onset  . Diabetes Mother   . Alzheimer's disease Father   . Hypertension Other     Social History Social History   Tobacco Use  . Smoking status: Current Every Day Smoker    Packs/day: 1.00    Types: Cigarettes  . Smokeless tobacco: Never Used  Substance Use Topics  . Alcohol use: Yes    Comment: occasionally  . Drug use: Yes    Types: Marijuana     Allergies   Cortisone and Doxycycline   Review of Systems Review of Systems  All other systems reviewed and are negative.    Physical Exam Updated Vital Signs BP 100/70 (BP Location: Left Arm)   Pulse 64   Temp (!) 97.5 F (36.4 C) (Oral)   Resp 16   Ht 5\' 6"  (1.676 m)   Wt 88.5 kg   SpO2 99%   BMI 31.47 kg/m   Physical Exam  Constitutional: She is oriented to person, place, and time. She appears well-developed  and well-nourished. She appears distressed (She is uncomfortable).  HENT:  Head: Normocephalic and atraumatic.  Eyes: Pupils are equal, round, and reactive to light. Conjunctivae and EOM are normal.  Neck: Normal range of motion and phonation normal. Neck supple.  Cardiovascular: Normal rate and regular rhythm.  Pulmonary/Chest: Effort normal and breath sounds normal. She exhibits no tenderness.  Abdominal: Soft. She exhibits no distension. There is no tenderness. There is no guarding.  Musculoskeletal: Normal range of motion.  Neurological: She is alert and oriented to person, place, and time. She exhibits normal muscle tone.  No dysarthria, aphasia or nystagmus.  Skin: Skin is warm and dry.  Psychiatric: She has a normal mood and affect. Her behavior is normal. Judgment and thought content normal.  Nursing note and vitals reviewed.    ED Treatments / Results  Labs (all labs ordered are listed, but only abnormal results are  displayed) Labs Reviewed  COMPREHENSIVE METABOLIC PANEL - Abnormal; Notable for the following components:      Result Value   Sodium 134 (*)    Potassium 2.4 (*)    CO2 20 (*)    Creatinine, Ser 1.41 (*)    Calcium 8.5 (*)    Total Bilirubin 1.3 (*)    GFR calc non Af Amer 44 (*)    GFR calc Af Amer 51 (*)    All other components within normal limits  CBC WITH DIFFERENTIAL/PLATELET  TROPONIN I    EKG EKG Interpretation  Date/Time:  Tuesday June 04 2018 13:31:46 EDT Ventricular Rate:  71 PR Interval:    QRS Duration: 94 QT Interval:  397 QTC Calculation: 432 R Axis:   78 Text Interpretation:  Sinus rhythm since last tracing no significant change Confirmed by Daleen Bo [id number]) on 06/04/2018 1:54:55 PM   Radiology Dg Chest 2 View  Result Date: 06/04/2018 CLINICAL DATA:  Chest pain, nausea, and extremity numbness while at work. Sensation of being very hot. Current smoker. History of hypertension. EXAM: CHEST - 2 VIEW COMPARISON:  Chest x-ray of April 04, 2018 FINDINGS: The lungs are well-expanded and clear. The heart and mediastinal structures are normal. There is no pleural effusion. The bony thorax is unremarkable. IMPRESSION: There is no active cardiopulmonary disease. Electronically Signed   By: David  Martinique M.D.   On: 06/04/2018 13:58    Procedures .Critical Care Performed by: Daleen Bo, MD Authorized by: Daleen Bo, MD   Critical care provider statement:    Critical care time (minutes):  35   Critical care start time:  06/04/2018 1:28 PM   Critical care end time:  06/04/2018 6:28 PM   Critical care time was exclusive of:  Separately billable procedures and treating other patients   Critical care was necessary to treat or prevent imminent or life-threatening deterioration of the following conditions:  Metabolic crisis   Critical care was time spent personally by me on the following activities:  Blood draw for specimens, development of treatment plan with  patient or surrogate, discussions with consultants, evaluation of patient's response to treatment, examination of patient, obtaining history from patient or surrogate, ordering and performing treatments and interventions, ordering and review of laboratory studies, pulse oximetry, re-evaluation of patient's condition, review of old charts and ordering and review of radiographic studies   (including critical care time)  Medications Ordered in ED Medications  potassium chloride SA (K-DUR,KLOR-CON) CR tablet 40 mEq (40 mEq Oral Given 06/04/18 1536)  potassium chloride 10 mEq in 100 mL IVPB (0 mEq Intravenous  Stopped 06/04/18 1642)  acetaminophen (TYLENOL) tablet 650 mg (650 mg Oral Given 06/04/18 1558)  traMADol (ULTRAM) tablet 50 mg (50 mg Oral Given 06/04/18 1741)     Initial Impression / Assessment and Plan / ED Course  I have reviewed the triage vital signs and the nursing notes.  Pertinent labs & imaging results that were available during my care of the patient were reviewed by me and considered in my medical decision making (see chart for details).  Clinical Course as of Jun 05 1827  Tue Jun 04, 2018  1703 She was treated with Tylenol for headache and still complains of headache.  Checked narcotic database, no entries for the last 6 months.   [EW]  1703 Normal  Troponin I [EW]  1703 Normal except sodium low, potassium low, creatinine high, bilirubin high, GFR low  Comprehensive metabolic panel(!!) [EW]  0349 Normal  CBC with Differential [EW]    Clinical Course User Index [EW] Daleen Bo, MD     Patient Vitals for the past 24 hrs:  BP Temp Temp src Pulse Resp SpO2 Height Weight  06/04/18 1751 100/70 - - 64 16 99 % - -  06/04/18 1615 93/73 - - 61 - 100 % - -  06/04/18 1600 95/82 - - 63 (!) 21 99 % - -  06/04/18 1545 103/71 - - 68 (!) 26 100 % - -  06/04/18 1530 96/69 - - (!) 59 (!) 21 98 % - -  06/04/18 1500 (!) 88/63 - - 60 20 94 % - -  06/04/18 1330 (!) 82/68 (!) 97.5 F  (36.4 C) Oral 72 14 99 % - -  06/04/18 1324 - - - - - - 5\' 6"  (1.676 m) 88.5 kg    6:26 PM Reevaluation with update and discussion. After initial assessment and treatment, an updated evaluation reveals she is alert, and calm.  She has been able to eat a hamburger since here.  Blood pressure improved.  Findings discussed with patient and family members, all questions answered. Daleen Bo   Medical Decision Making: Weakness and hypotension, nonspecific, with incidental hypokalemia.  Patient improved after IV fluids indicating likely volume depletion.  Incidental hypokalemia related to unopposed diuretic treatment.  She will have to be observed closely since she is on losartan which can cause hyperkalemia.  Close follow up with PCP is advised  CRITICAL CARE-yes Performed by: Daleen Bo   Nursing Notes Reviewed/ Care Coordinated Applicable Imaging Reviewed Interpretation of Laboratory Data incorporated into ED treatment  The patient appears reasonably screened and/or stabilized for discharge and I doubt any other medical condition or other [facility] requiring further screening, evaluation, or treatment in the ED at this time prior to discharge.  Plan: Home Medications-continue current medications; Home Treatments-rest, fluids, high potassium diet; return here if the recommended treatment, does not improve the symptoms; Recommended follow up-to be follow-up 1 week and as needed   Final Clinical Impressions(s) / ED Diagnoses   Final diagnoses:  Weakness  Hypokalemia  Hypotension, unspecified hypotension type    ED Discharge Orders         Ordered    potassium chloride SA (K-DUR,KLOR-CON) 20 MEQ tablet  Daily     06/04/18 1824           Daleen Bo, MD 06/04/18 1828

## 2018-06-04 NOTE — ED Triage Notes (Signed)
Pt states that she is having chest pains with sob and weakness states that she feels like a wet rag.

## 2019-01-08 ENCOUNTER — Other Ambulatory Visit: Payer: Self-pay

## 2019-01-08 ENCOUNTER — Emergency Department (HOSPITAL_COMMUNITY): Payer: Self-pay

## 2019-01-08 ENCOUNTER — Encounter (HOSPITAL_COMMUNITY): Payer: Self-pay

## 2019-01-08 ENCOUNTER — Emergency Department (HOSPITAL_COMMUNITY)
Admission: EM | Admit: 2019-01-08 | Discharge: 2019-01-08 | Disposition: A | Discharge status: Home / Self Care | Payer: Self-pay | Attending: Emergency Medicine | Admitting: Emergency Medicine

## 2019-01-08 DIAGNOSIS — M62830 Muscle spasm of back: Secondary | ICD-10-CM

## 2019-01-08 DIAGNOSIS — M5136 Other intervertebral disc degeneration, lumbar region: Secondary | ICD-10-CM | POA: Insufficient documentation

## 2019-01-08 DIAGNOSIS — F17210 Nicotine dependence, cigarettes, uncomplicated: Secondary | ICD-10-CM | POA: Insufficient documentation

## 2019-01-08 DIAGNOSIS — I10 Essential (primary) hypertension: Secondary | ICD-10-CM | POA: Insufficient documentation

## 2019-01-08 DIAGNOSIS — Z79899 Other long term (current) drug therapy: Secondary | ICD-10-CM | POA: Insufficient documentation

## 2019-01-08 DIAGNOSIS — M51369 Other intervertebral disc degeneration, lumbar region without mention of lumbar back pain or lower extremity pain: Secondary | ICD-10-CM

## 2019-01-08 DIAGNOSIS — Z9049 Acquired absence of other specified parts of digestive tract: Secondary | ICD-10-CM | POA: Insufficient documentation

## 2019-01-08 DIAGNOSIS — F419 Anxiety disorder, unspecified: Secondary | ICD-10-CM | POA: Insufficient documentation

## 2019-01-08 DIAGNOSIS — M5030 Other cervical disc degeneration, unspecified cervical region: Secondary | ICD-10-CM

## 2019-01-08 LAB — URINALYSIS, ROUTINE W REFLEX MICROSCOPIC
Bilirubin Urine: NEGATIVE
Glucose, UA: NEGATIVE mg/dL
Hgb urine dipstick: NEGATIVE
Ketones, ur: NEGATIVE mg/dL
Leukocytes,Ua: NEGATIVE
Nitrite: NEGATIVE
Protein, ur: NEGATIVE mg/dL
Specific Gravity, Urine: 1.014 (ref 1.005–1.030)
pH: 6 (ref 5.0–8.0)

## 2019-01-08 LAB — PREGNANCY, URINE: Preg Test, Ur: NEGATIVE

## 2019-01-08 MED ORDER — IBUPROFEN 600 MG PO TABS: 600.0000 mg | ORAL_TABLET | Freq: Four times a day (QID) | ORAL | 0 refills | Status: DC

## 2019-01-08 MED ORDER — TRAMADOL HCL 50 MG PO TABS
100.0000 mg | ORAL_TABLET | Freq: Once | ORAL | Status: AC
  Administered 2019-01-08: 100 mg via ORAL
  Filled 2019-01-08: qty 2

## 2019-01-08 MED ORDER — KETOROLAC TROMETHAMINE 10 MG PO TABS
10.0000 mg | ORAL_TABLET | Freq: Once | ORAL | Status: AC
  Administered 2019-01-08: 10 mg via ORAL
  Filled 2019-01-08: qty 1

## 2019-01-08 MED ORDER — CYCLOBENZAPRINE HCL 10 MG PO TABS
10.0000 mg | ORAL_TABLET | Freq: Once | ORAL | Status: AC
  Administered 2019-01-08: 10 mg via ORAL
  Filled 2019-01-08: qty 1

## 2019-01-08 MED ORDER — CYCLOBENZAPRINE HCL 10 MG PO TABS: 10.0000 mg | ORAL_TABLET | Freq: Three times a day (TID) | ORAL | 0 refills | Status: DC

## 2019-01-08 MED ORDER — TRAMADOL HCL 50 MG PO TABS: 50.0000 mg | ORAL_TABLET | Freq: Four times a day (QID) | ORAL | 0 refills | Status: DC | PRN

## 2019-01-08 MED ORDER — ONDANSETRON HCL 4 MG PO TABS
4.0000 mg | ORAL_TABLET | Freq: Once | ORAL | Status: AC
  Administered 2019-01-08: 4 mg via ORAL
  Filled 2019-01-08: qty 1

## 2019-01-08 NOTE — ED Notes (Signed)
Pt complaining of lower back pain on both sides. States the pain goes down to her toes. Pain is not constant, but it does come and go. Pain also goes to her thoracic spine. Also complains of not being able to lift things with her left arm. States both arms have been hurting.

## 2019-01-08 NOTE — ED Notes (Signed)
Pt to CT

## 2019-01-08 NOTE — ED Provider Notes (Signed)
[facility]ERG[facility] Provider Note   CSN: [id number] Arrival date & time: 01/08/19  1315    History   Chief Complaint Chief Complaint  Patient presents with  . Back Pain    HPI Dominique Hinton is a 48 y.o. female.     Patient is a 48 year old female who presents to the emergency department with a complaint of lower back pain.  The patient states that this problem is been going on for 2 weeks.  She says at times her entire back hurts from her neck down to her tailbone.  Most recently she is been having some problems with her shoulders hurting, sensation of a burning pain from the shoulders going up both sides of the neck.  She says that when she lifts her arms she has some weakness at times.  She is not dropping any objects.  She has control of her arms, but says the pain seems to be getting worse.  The patient also complains of lower back pain that involves her right hip.  She says at times it feels like the pain involves her right sciatic nerve and goes from her back, to her hip, down to her right knee.  She says that she does a lot of standing and some lifting at her job and she says at this point the pain has become almost unbearable after she is been standing for a while.  There is been no loss of bowel or bladder function.  No excessive falls reported.  No problems with balance.  No fever or chills reported.  The patient has not been ill recently.  Patient has not been around anyone else who is been ill.  She presents now for assistance with this issue.  The history is provided by the patient.  Back Pain  Associated symptoms: weakness   Associated symptoms: no abdominal pain, no chest pain and no dysuria     Past Medical History:  Diagnosis Date  . Anxiety   . Arthritis   . Hypertension     There are no active problems to display for this patient.   Past Surgical History:  Procedure Laterality Date  . ABDOMINAL HYSTERECTOMY    . APPENDECTOMY    . BREAST  SURGERY    . CHOLECYSTECTOMY    . TUBAL LIGATION       OB History    Gravida  4   Para  3   Term  3   Preterm      AB  1   Living        SAB  1   TAB      Ectopic      Multiple      Live Births               Home Medications    Prior to Admission medications   Medication Sig Start Date End Date Taking? Authorizing Provider  citalopram (CELEXA) 20 MG tablet Take 20 mg by mouth daily. 01/21/18   [provider]  dicyclomine (BENTYL) 20 MG tablet Take 1 tablet (20 mg total) by mouth 2 (two) times daily. Patient not taking: Reported on 06/04/2018 04/05/18   Orpah Greek, MD  HYDROcodone-acetaminophen (NORCO/VICODIN) 5-325 MG tablet Take 1-2 tablets by mouth every 4 (four) hours as needed. Patient not taking: Reported on 06/04/2018 04/05/18   Orpah Greek, MD  losartan-hydrochlorothiazide (HYZAAR) 100-25 MG tablet Take 1 tablet by mouth daily. 03/06/18   [provider]  potassium chloride SA (K-DUR,KLOR-CON) 20 MEQ tablet Take 1 tablet (20 mEq total) by mouth daily. 06/04/18   Daleen Bo, MD    Family History Family History  Problem Relation Age of Onset  . Diabetes Mother   . Alzheimer's disease Father   . Hypertension Other     Social History Social History   Tobacco Use  . Smoking status: Current Every Day Smoker    Packs/day: 1.00    Types: Cigarettes  . Smokeless tobacco: Never Used  Substance Use Topics  . Alcohol use: Yes    Comment: occasionally  . Drug use: Yes    Types: Marijuana     Allergies   Cortisone and Doxycycline   Review of Systems Review of Systems  Constitutional: Negative for activity change.       All ROS Neg except as noted in HPI  HENT: Negative for nosebleeds.   Eyes: Negative for photophobia and discharge.  Respiratory: Negative for cough, shortness of breath and wheezing.   Cardiovascular: Negative for chest pain and palpitations.  Gastrointestinal: Negative for abdominal pain  and blood in stool.  Genitourinary: Negative for dysuria, frequency and hematuria.  Musculoskeletal: Positive for back pain. Negative for arthralgias and neck pain.  Skin: Negative.   Neurological: Positive for weakness. Negative for dizziness, seizures and speech difficulty.  Psychiatric/Behavioral: Negative for confusion and hallucinations.     Physical Exam Updated Vital Signs BP (!) 107/92 (BP Location: Right Arm)   Pulse 69   Temp 98.8 F (37.1 C) (Oral)   Resp 15   Ht 5\' 6"  (1.676 m)   Wt 86.2 kg   SpO2 99%   BMI 30.67 kg/m   Physical Exam Vitals signs and nursing note reviewed.  Constitutional:      Appearance: She is well-developed. She is not toxic-appearing.  HENT:     Head: Normocephalic.     Right Ear: Tympanic membrane and external ear normal.     Left Ear: Tympanic membrane and external ear normal.  Eyes:     General: Lids are normal.     Pupils: Pupils are equal, round, and reactive to light.  Neck:     Musculoskeletal: Normal range of motion and neck supple. Muscular tenderness present. No neck rigidity.     Vascular: No carotid bruit.  Cardiovascular:     Rate and Rhythm: Normal rate and regular rhythm.     Pulses: Normal pulses.     Heart sounds: Normal heart sounds.  Pulmonary:     Effort: No respiratory distress.     Breath sounds: Normal breath sounds.  Abdominal:     General: Bowel sounds are normal.     Palpations: Abdomen is soft.     Tenderness: There is no abdominal tenderness. There is no guarding.  Musculoskeletal:     Cervical back: She exhibits decreased range of motion, pain and spasm. She exhibits no swelling and no deformity.     Lumbar back: She exhibits pain and spasm.     Comments: Bilateral paraspinal tenderness in the cervical area.  No hot areas appreciated.  No palpable step-off of the cervical, thoracic, or lumbar area.  Pain in the paraspinal and spinal area in the lumbar region.  Right paraspinal is more tender than the  left.  There is pain on right straight leg raise at 35 to 40 degrees.  Lymphadenopathy:     Head:     Right side of head: No submandibular adenopathy.     Left side  of head: No submandibular adenopathy.     Cervical: No cervical adenopathy.  Skin:    General: Skin is warm and dry.  Neurological:     Mental Status: She is alert and oriented to person, place, and time.     Cranial Nerves: No cranial nerve deficit.     Sensory: No sensory deficit.     Comments: No motor or sensory deficits of the upper or lower extremity.  There is no numbness or decreased sensation in the saddle area.  Psychiatric:        Speech: Speech normal.      ED Treatments / Results  Labs (all labs ordered are listed, but only abnormal results are displayed) Labs Reviewed  URINALYSIS, [place], URINE    EKG None  Radiology No results found.  Procedures Procedures (including critical care time)  Medications Ordered in ED Medications - No data to display   Initial Impression / Assessment and Plan / ED Course  I have reviewed the triage vital signs and the nursing notes.  Pertinent labs & imaging results that were available during my care of the patient were reviewed by me and considered in my medical decision making (see chart for details).          Final Clinical Impressions(s) / ED Diagnoses MDM  Vital signs reviewed.  Pulse oximetry is 98% on room air.  Within normal limits by my interpretation.  There are no gross neurologic deficits of the upper or lower extremities.  No evidence for cauda equina or other emergent changes.  There is no noted deformity of the upper or lower spine area.  There are no hot areas appreciated.  I reviewed the images previous CT and MR.  Several years ago patient was noted to have syrinx lesions present.  We will recheck CT of the lumbar spine and the CT spine.  Urine analysis is negative for evidence of infection or kidney  stone.  Urine pregnancy is negative.  CT scan of the cervical spine shows no acute osseous abnormality.  There is mild to moderate disc disease with facet degeneration in the cervical spine.  There is no evidence of high-grade stenosis.  There is no tumor appreciated.  CT scan of the lumbar spine shows no acute osseous abnormality.  There is mild to moderate degenerative disc disease and facet degeneration in the lumbar spine.  There is no evidence of significant stenosis.  There is noted some aortic atherosclerosis.  I discussed these findings with the patient in terms which he understands.  I have asked her to follow-up with Dr. Asencion Noble.  I have ordered Flexeril, ibuprofen, and Ultram for her discomfort.  I have asked her to use a heating pad at rest.  I have ordered a work note for her to be excused over the next 2 days, returning to work on May 9.  Patient is in agreement with this plan.   Final diagnoses:  Degenerative disc disease, cervical  Degenerative disc disease, lumbar  Muscle spasm of back    ED Discharge Orders         Ordered    cyclobenzaprine (FLEXERIL) 10 MG tablet  3 times daily     01/08/19 1934    ibuprofen (ADVIL) 600 MG tablet  4 times daily     01/08/19 1934    traMADol (ULTRAM) 50 MG tablet  Every 6 hours PRN     01/08/19 1934  Lily Kocher, PA-C 01/08/19 1951    Lajean Saver, MD 01/09/19 1254

## 2019-01-08 NOTE — ED Triage Notes (Signed)
Pt reports back pain for 2 weeks. Pt reports entire back and shoulders hurt and pain is getting worse. Pt has numbness and burning in arms and chest

## 2019-01-08 NOTE — Discharge Instructions (Addendum)
Your oxygen level is 95% on room air.  Your neurologic examination is negative for acute deficits.  The CT scan of your cervical spine and lumbar spine show multiple areas of degenerative disc changes and arthritis.  There does not appear to be any mass present and there is no acute changes involving your spine.  Your examination also shows multiple areas of spasm.  Please use Flexeril 3 times daily, ibuprofen with breakfast, lunch, dinner, and at bedtime.  May use Ultram for more severe pain.  Flexeril and Ultram may cause drowsiness.  Please do not drive a vehicle, operate machinery, drink alcohol, or participate in activities requiring concentration when taking either these medications.  Please see Dr. Willey Blade for additional evaluation in the office.  Please rest her back and neck over the next few days.

## 2019-03-04 ENCOUNTER — Other Ambulatory Visit: Payer: Self-pay | Admitting: Orthopedic Surgery

## 2019-03-04 ENCOUNTER — Other Ambulatory Visit (HOSPITAL_COMMUNITY): Payer: Self-pay | Admitting: Orthopedic Surgery

## 2019-03-04 DIAGNOSIS — T148XXA Other injury of unspecified body region, initial encounter: Secondary | ICD-10-CM

## 2019-03-17 ENCOUNTER — Ambulatory Visit (HOSPITAL_COMMUNITY)
Admission: RE | Admit: 2019-03-17 | Discharge: 2019-03-17 | Disposition: A | Discharge status: Home / Self Care | Payer: Self-pay | Source: Ambulatory Visit | Attending: Orthopedic Surgery | Admitting: Orthopedic Surgery

## 2019-03-17 ENCOUNTER — Other Ambulatory Visit: Payer: Self-pay

## 2019-03-17 DIAGNOSIS — T148XXA Other injury of unspecified body region, initial encounter: Secondary | ICD-10-CM | POA: Insufficient documentation

## 2019-03-24 ENCOUNTER — Other Ambulatory Visit: Payer: Self-pay | Admitting: Orthopedic Surgery

## 2019-03-24 DIAGNOSIS — M533 Sacrococcygeal disorders, not elsewhere classified: Secondary | ICD-10-CM

## 2019-04-07 ENCOUNTER — Other Ambulatory Visit: Payer: Self-pay

## 2019-04-07 ENCOUNTER — Ambulatory Visit
Admission: RE | Admit: 2019-04-07 | Discharge: 2019-04-07 | Disposition: A | Discharge status: Home / Self Care | Payer: Self-pay | Source: Ambulatory Visit | Attending: Orthopedic Surgery | Admitting: Orthopedic Surgery

## 2019-04-07 DIAGNOSIS — M533 Sacrococcygeal disorders, not elsewhere classified: Secondary | ICD-10-CM

## 2019-04-25 ENCOUNTER — Encounter (HOSPITAL_COMMUNITY): Payer: Self-pay | Admitting: Emergency Medicine

## 2019-04-25 ENCOUNTER — Emergency Department (HOSPITAL_COMMUNITY)
Admission: EM | Admit: 2019-04-25 | Discharge: 2019-04-25 | Disposition: A | Discharge status: Home / Self Care | Payer: Self-pay | Attending: Emergency Medicine | Admitting: Emergency Medicine

## 2019-04-25 ENCOUNTER — Emergency Department (HOSPITAL_COMMUNITY): Payer: Self-pay

## 2019-04-25 ENCOUNTER — Other Ambulatory Visit: Payer: Self-pay

## 2019-04-25 DIAGNOSIS — M5410 Radiculopathy, site unspecified: Secondary | ICD-10-CM | POA: Insufficient documentation

## 2019-04-25 DIAGNOSIS — F17210 Nicotine dependence, cigarettes, uncomplicated: Secondary | ICD-10-CM | POA: Insufficient documentation

## 2019-04-25 DIAGNOSIS — M25511 Pain in right shoulder: Secondary | ICD-10-CM | POA: Insufficient documentation

## 2019-04-25 DIAGNOSIS — M25512 Pain in left shoulder: Secondary | ICD-10-CM | POA: Insufficient documentation

## 2019-04-25 DIAGNOSIS — I10 Essential (primary) hypertension: Secondary | ICD-10-CM | POA: Insufficient documentation

## 2019-04-25 DIAGNOSIS — Z79899 Other long term (current) drug therapy: Secondary | ICD-10-CM | POA: Insufficient documentation

## 2019-04-25 MED ORDER — DEXAMETHASONE SODIUM PHOSPHATE 10 MG/ML IJ SOLN
10.0000 mg | Freq: Once | INTRAMUSCULAR | Status: AC
  Administered 2019-04-25: 13:00:00 10 mg via INTRAMUSCULAR
  Filled 2019-04-25: qty 1

## 2019-04-25 MED ORDER — FAMOTIDINE 20 MG PO TABS: 20.0000 mg | ORAL_TABLET | Freq: Every day | ORAL | 0 refills | Status: DC

## 2019-04-25 MED ORDER — METHOCARBAMOL 500 MG PO TABS: 500.0000 mg | ORAL_TABLET | Freq: Two times a day (BID) | ORAL | 0 refills | Status: DC | PRN

## 2019-04-25 MED ORDER — NAPROXEN 500 MG PO TABS: 500.0000 mg | ORAL_TABLET | Freq: Two times a day (BID) | ORAL | 0 refills | Status: DC

## 2019-04-25 MED ORDER — METHOCARBAMOL 500 MG PO TABS
1000.0000 mg | ORAL_TABLET | Freq: Once | ORAL | Status: AC
  Administered 2019-04-25: 1000 mg via ORAL
  Filled 2019-04-25: qty 2

## 2019-04-25 MED ORDER — PREDNISONE 10 MG (21) PO TBPK: ORAL_TABLET | Freq: Every day | ORAL | 0 refills | Status: DC

## 2019-04-25 MED ORDER — KETOROLAC TROMETHAMINE 30 MG/ML IJ SOLN
30.0000 mg | Freq: Once | INTRAMUSCULAR | Status: AC
  Administered 2019-04-25: 30 mg via INTRAMUSCULAR
  Filled 2019-04-25: qty 1

## 2019-04-25 NOTE — Discharge Instructions (Signed)
Take naproxen 2 times a day with meals.  Do not take other anti-inflammatories at the same time (Advil, Motrin, ibuprofen, Aleve). You may supplement with Tylenol if you need further pain control. Take prednisone as prescribed. While taking prednisone and naproxen, you may develop stomach upset.  Take Pepcid daily while taking the 2 medications. Use Robaxin as needed for muscle stiffness or soreness. Have caution, as this may make you tired or groggy. Do not drive or operate heavy machinery while taking this medication.  Use muscle creams (bengay, icy hot, salonpas) as needed for pain.  Follow up with your orthopedic doctor for further evaluation.  Return to the ER if you develop high fevers, numbness, or any new or concerning symptoms.

## 2019-04-25 NOTE — ED Notes (Signed)
Patient transported to X-ray 

## 2019-04-25 NOTE — ED Triage Notes (Signed)
Pt c/o LT sided shoulder pain and numbness that radiates down LT arm x 1 month. Pt does a lot of lifting at work. Pt reports when she woke up this morning, she felt like her hand was contracted up, her LT arm started off numb, but then she began having burning sensation in her shoulder.

## 2019-04-25 NOTE — ED Provider Notes (Signed)
[facility] EMERGENCY DEPARTMENT Provider Note   CSN: [id number] Arrival date & time: 04/25/19  1114     History   Chief Complaint Chief Complaint  Patient presents with   Shoulder Pain    HPI Dominique Hinton is a 48 y.o. female presenting for evaluation of bilateral shoulder pain and burning.  Patient states for several month she has been having issues with her neck, shoulders, and arms.  She also has been having back issues, following up with Dr. Lynann Bologna for management of her back.  Over the past day, she reports worsening tightness and pain around both shoulders.  Patient states pain feels like it starts in her neck and splits off on her shoulders and goes into both arms.  She reports feeling weak and burning on both sides.  Slightly worse on the left side.  She has not taken anything for her symptoms including Tylenol ibuprofen.  She denies fevers, chills, fall, trauma, or injury.  She reports pain and numbness at the same time.  No change in her lower back pain.  No urinary symptoms.  Patient states she was having similar symptoms when she was evaluated 3 months ago and had a negative CT cervical at that time. Pt states she does a lot of lifting, carrying, and upper extremity movement at work, and this makes her sxs worse.      HPI  Past Medical History:  Diagnosis Date   Anxiety    Arthritis    Hypertension     There are no active problems to display for this patient.   Past Surgical History:  Procedure Laterality Date   ABDOMINAL HYSTERECTOMY     APPENDECTOMY     BREAST SURGERY     CHOLECYSTECTOMY     TUBAL LIGATION       OB History    Gravida  4   Para  3   Term  3   Preterm      AB  1   Living        SAB  1   TAB      Ectopic      Multiple      Live Births               Home Medications    Prior to Admission medications   Medication Sig Start Date End Date Taking? Authorizing Provider  citalopram (CELEXA) 20 MG tablet  Take 20 mg by mouth daily. 01/21/18   [provider]  cyclobenzaprine (FLEXERIL) 10 MG tablet Take 1 tablet (10 mg total) by mouth 3 (three) times daily. 01/08/19   Lily Kocher, PA-C  dicyclomine (BENTYL) 20 MG tablet Take 1 tablet (20 mg total) by mouth 2 (two) times daily. Patient not taking: Reported on 06/04/2018 04/05/18   Orpah Greek, MD  famotidine (PEPCID) 20 MG tablet Take 1 tablet (20 mg total) by mouth daily. 04/25/19   Stephanne Greeley, PA-C  HYDROcodone-acetaminophen (NORCO/VICODIN) 5-325 MG tablet Take 1-2 tablets by mouth every 4 (four) hours as needed. Patient not taking: Reported on 06/04/2018 04/05/18   Orpah Greek, MD  ibuprofen (ADVIL) 600 MG tablet Take 1 tablet (600 mg total) by mouth 4 (four) times daily. 01/08/19   Lily Kocher, PA-C  losartan-hydrochlorothiazide (HYZAAR) 100-25 MG tablet Take 1 tablet by mouth daily. 03/06/18   [provider]  meloxicam (MOBIC) 15 MG tablet Take 15 mg by mouth daily. 12/08/18   [provider]  methocarbamol (ROBAXIN) 500 MG  tablet Take 1 tablet (500 mg total) by mouth 2 (two) times daily as needed for muscle spasms. 04/25/19   Gracieann Stannard, PA-C  naproxen (NAPROSYN) 500 MG tablet Take 1 tablet (500 mg total) by mouth 2 (two) times daily with a meal. 04/25/19   Rieley Hausman, PA-C  potassium chloride SA (K-DUR,KLOR-CON) 20 MEQ tablet Take 1 tablet (20 mEq total) by mouth daily. 06/04/18   Daleen Bo, MD  predniSONE (STERAPRED UNI-PAK 21 TAB) 10 MG (21) TBPK tablet Take by mouth daily. Take 6 tabs by mouth daily  for 2 days, then 5 tabs for 2 days, then 4 tabs for 2 days, then 3 tabs for 2 days, 2 tabs for 2 days, then 1 tab by mouth daily for 2 days 04/25/19   Maricel Swartzendruber, PA-C  traMADol (ULTRAM) 50 MG tablet Take 1 tablet (50 mg total) by mouth every 6 (six) hours as needed. 01/08/19   Lily Kocher, PA-C    Family History Family History  Problem Relation Age of Onset   Diabetes  Mother    Alzheimer's disease Father    Hypertension Other     Social History Social History   Tobacco Use   Smoking status: Current Every Day Smoker    Packs/day: 1.00    Types: Cigarettes   Smokeless tobacco: Never Used  Substance Use Topics   Alcohol use: Yes    Comment: occasionally   Drug use: Yes    Types: Marijuana    Comment: last use this morning 8/21     Allergies   Cortisone and Doxycycline   Review of Systems Review of Systems  Musculoskeletal: Positive for arthralgias, back pain, myalgias and neck pain.  Neurological: Positive for numbness (numbness, burning, and pain).  All other systems reviewed and are negative.    Physical Exam Updated Vital Signs BP (!) 140/98 (BP Location: Right Arm)    Pulse 92    Temp 98.8 F (37.1 C) (Oral)    Resp 16    Ht 5\' 6"  (1.676 m)    Wt 81.6 kg    SpO2 100%    BMI 29.05 kg/m   Physical Exam Vitals signs and nursing note reviewed.  Constitutional:      General: She is not in acute distress.    Appearance: She is well-developed.     Comments: Sitting in the bed no acute distress  HENT:     Head: Normocephalic and atraumatic.  Eyes:     Conjunctiva/sclera: Conjunctivae normal.     Pupils: Pupils are equal, round, and reactive to light.  Neck:     Musculoskeletal: Normal range of motion and neck supple. Muscular tenderness present.     Comments: Full active range of motion of the head with pain.  Increased pain when tilting head towards the left, looking up, and looking down.  Generalized tenderness palpation of the neck, no increased pain over midline C-spine. Cardiovascular:     Rate and Rhythm: Normal rate and regular rhythm.     Pulses: Normal pulses.  Pulmonary:     Effort: Pulmonary effort is normal. No respiratory distress.     Breath sounds: Normal breath sounds. No wheezing.  Abdominal:     General: There is no distension.     Palpations: Abdomen is soft. There is no mass.     Tenderness: There  is no abdominal tenderness. There is no guarding or rebound.  Musculoskeletal: Normal range of motion.     Comments: Strength of upper extremities  intact, 4 out of 5 strength.  Radial pulses intact.  Good distal cap refill.  Sensation intact.  Full active range of motion of the arms with pain.  No erythema, warmth, or swelling.  Skin:    General: Skin is warm and dry.     Capillary Refill: Capillary refill takes less than 2 seconds.  Neurological:     Mental Status: She is alert and oriented to person, place, and time.      ED Treatments / Results  Labs (all labs ordered are listed, but only abnormal results are displayed) Labs Reviewed - No data to display  EKG None  Radiology Dg Shoulder Left  Result Date: 04/25/2019 CLINICAL DATA:  Pain EXAM: LEFT SHOULDER - 2+ VIEW COMPARISON:  Chest radiograph November 30, 2017 FINDINGS: Oblique, Y scapular, and axillary images obtained. There is an old healed fracture of the junction of the mid and lateral thirds of the left clavicle with remodeling. There is stable overriding of fracture fragments in this area. No acute fracture or dislocation. There is slight narrowing of the acromioclavicular joint. The glenohumeral joint appears normal. No erosive changes. Visualized left lung clear. IMPRESSION: 1. Old fracture of the left clavicle with remodeling, stable in appearance. 2.  No acute fracture or dislocation. 3. Mild narrowing acromioclavicular joint. Glenohumeral joint appears unremarkable. Electronically Signed   By: Lowella Grip III M.D.   On: 04/25/2019 11:53    Procedures Procedures (including critical care time)  Medications Ordered in ED Medications  dexamethasone (DECADRON) injection 10 mg (10 mg Intramuscular Given 04/25/19 1300)  ketorolac (TORADOL) 30 MG/ML injection 30 mg (30 mg Intramuscular Given 04/25/19 1301)  methocarbamol (ROBAXIN) tablet 1,000 mg (1,000 mg Oral Given 04/25/19 1300)     Initial Impression / Assessment and  Plan / ED Course  I have reviewed the triage vital signs and the nursing notes.  Pertinent labs & imaging results that were available during my care of the patient were reviewed by me and considered in my medical decision making (see chart for details).        Patient presenting for evaluation of worsening bilateral upper extremity pain, burning, numbness, weakness.  Symptoms began in her cervical spine.  Since symptoms began, she has had a negative CT.  No trauma or injury.  No fever or signs of meningitis.  Patient reporting numbness, but also reporting pain and burning in the same place, indicating that she is not having complete numbness.  Symptoms are worse after patient does a lot of movement, activity, lifting.  Without trauma or injury, low suspicion for vertebral fracture or injury.  She is neurologically intact.  Likely radiculopathy.  I do not believe she needs emergent MRI, as have low suspicion for trauma or infection.  Will treat with steroids, Toradol, and muscle relaxer and reassess.  On reassessment, patient reports symptoms are improving.  Discussed continued symptomatic treatment and close follow-up with orthopedics.  At this time, patient appears safe for discharge.  Return precautions given.  Patient states she understands and agrees to plan.  Final Clinical Impressions(s) / ED Diagnoses   Final diagnoses:  Acute pain of both shoulders  Radiculopathy, unspecified spinal region    ED Discharge Orders         Ordered    predniSONE (STERAPRED UNI-PAK 21 TAB) 10 MG (21) TBPK tablet  Daily     04/25/19 1406    naproxen (NAPROSYN) 500 MG tablet  2 times daily with meals  04/25/19 1406    famotidine (PEPCID) 20 MG tablet  Daily     04/25/19 1406    methocarbamol (ROBAXIN) 500 MG tablet  2 times daily PRN     04/25/19 1406           Shamal Stracener, PA-C 04/25/19 1419    Milton Ferguson, MD 04/28/19 [phone]

## 2019-05-06 ENCOUNTER — Other Ambulatory Visit (HOSPITAL_COMMUNITY): Payer: Self-pay | Admitting: Orthopedic Surgery

## 2019-05-06 ENCOUNTER — Other Ambulatory Visit: Payer: Self-pay | Admitting: Orthopedic Surgery

## 2019-05-06 DIAGNOSIS — M542 Cervicalgia: Secondary | ICD-10-CM

## 2019-05-06 DIAGNOSIS — M533 Sacrococcygeal disorders, not elsewhere classified: Secondary | ICD-10-CM

## 2019-05-13 ENCOUNTER — Other Ambulatory Visit: Payer: Self-pay

## 2019-05-13 ENCOUNTER — Ambulatory Visit
Admission: RE | Admit: 2019-05-13 | Discharge: 2019-05-13 | Disposition: A | Discharge status: Home / Self Care | Payer: Self-pay | Source: Ambulatory Visit | Attending: Orthopedic Surgery | Admitting: Orthopedic Surgery

## 2019-05-13 DIAGNOSIS — M533 Sacrococcygeal disorders, not elsewhere classified: Secondary | ICD-10-CM

## 2019-05-13 MED ORDER — METHYLPREDNISOLONE ACETATE 40 MG/ML INJ SUSP (RADIOLOG: 120.0000 mg | Freq: Once | INTRAMUSCULAR | Status: DC

## 2019-05-14 ENCOUNTER — Encounter (HOSPITAL_COMMUNITY): Payer: Self-pay

## 2019-05-14 ENCOUNTER — Ambulatory Visit (HOSPITAL_COMMUNITY): Payer: Self-pay

## 2019-05-19 DIAGNOSIS — M542 Cervicalgia: Secondary | ICD-10-CM | POA: Diagnosis not present

## 2019-06-26 ENCOUNTER — Encounter

## 2019-06-26 ENCOUNTER — Ambulatory Visit: Payer: Medicaid Other | Admitting: Neurology

## 2019-06-26 ENCOUNTER — Encounter: Payer: Self-pay | Admitting: Neurology

## 2019-06-26 ENCOUNTER — Other Ambulatory Visit: Payer: Self-pay

## 2019-06-26 VITALS — BP 127/89 | HR 62 | Temp 98.2°F | Ht 66.0 in | Wt 188.5 lb

## 2019-06-26 DIAGNOSIS — R531 Weakness: Secondary | ICD-10-CM | POA: Diagnosis not present

## 2019-06-26 DIAGNOSIS — M79601 Pain in right arm: Secondary | ICD-10-CM | POA: Insufficient documentation

## 2019-06-26 DIAGNOSIS — M79602 Pain in left arm: Secondary | ICD-10-CM

## 2019-06-26 MED ORDER — DULOXETINE HCL 60 MG PO CPEP: 60.0000 mg | ORAL_CAPSULE | Freq: Every day | ORAL | 12 refills | Status: DC

## 2019-06-26 NOTE — Progress Notes (Signed)
PATIENT: Dominique Hinton DOB: 01/30/1971  Chief Complaint  Patient presents with  . Pain    Hospital follow up from 04/25/2019.  Reports pain in neck along with burning, tingling, numbness, tightness, weakness in her bilateral shoulders and arms.  Negative cervical MRI, at [facility], on 05/16/2019.  Marland Kitchen PCP    Asencion Noble, MD  . Orthopaedics    Phylliss Bob, MD - referring MD     HISTORICAL  Dominique Hinton is 48 year old female, seen in request by primary care physician Dr. Asencion Noble for evaluation of bilateral arm pain, initial evaluation was on June 26, 2019.  I have reviewed and summarized the referring note from the referring physician.  She had past medical history of hypertension, anxiety taking Celexa 20 mg daily.  April 25, 2019, she woke up noticed radiating pain to bilateral shoulder, left worse than right, difficulty lifting arm overhead, subjective weakness, she presented to emergency room, x-ray of shoulder showed old fracture of left clavicle with remodeling, mild narrowing acromioclavicular joint. Glenohumeral joint appears unremarkable.  Since its onset, she continues to experience similar symptoms, she denies gait abnormality, denies incontinence, has intermittent bilateral fingertips paresthesia  MRI of cervical spine from [facility] on May 16, 2019, incident small syrinx C6, no acute abnormality  REVIEW OF SYSTEMS: Full 14 system review of systems performed and notable only for as above All other review of systems were negative.  ALLERGIES: Allergies  Allergen Reactions  . Cortisone Other (See Comments)    Made me feel loopy and I could not breathe  . Doxycycline Rash    HOME MEDICATIONS: Current Outpatient Medications  Medication Sig Dispense Refill  . citalopram (CELEXA) 20 MG tablet Take 20 mg by mouth daily.  3  . hydrochlorothiazide (HYDRODIURIL) 25 MG tablet Take 25 mg by mouth daily.    Marland Kitchen losartan (COZAAR) 100 MG tablet Take 100 mg  by mouth daily.    . meloxicam (MOBIC) 15 MG tablet Take 15 mg by mouth daily.    . methocarbamol (ROBAXIN) 500 MG tablet Take 1 tablet (500 mg total) by mouth 2 (two) times daily as needed for muscle spasms. 14 tablet 0  . valACYclovir (VALTREX) 1000 MG tablet Take 1,000 mg by mouth daily.     No current facility-administered medications for this visit.     PAST MEDICAL HISTORY: Past Medical History:  Diagnosis Date  . Anxiety   . Arthritis   . Cervical pain   . Hypertension     PAST SURGICAL HISTORY: Past Surgical History:  Procedure Laterality Date  . ABDOMINAL HYSTERECTOMY    . APPENDECTOMY    . BREAST SURGERY    . CHOLECYSTECTOMY    . TUBAL LIGATION      FAMILY HISTORY: Family History  Problem Relation Age of Onset  . Other Mother        "borderline diabetic"  . Alzheimer's disease Father   . Hypertension Other     SOCIAL HISTORY: Social History   Socioeconomic History  . Marital status: Legally Separated    Spouse name: Not on file  . Number of children: 3  . Years of education: 21  . Highest education level: Not on file  Occupational History  . Occupation: waitress/cook  Social Needs  . Financial resource strain: Not on file  . Food insecurity    Worry: Not on file    Inability: Not on file  . Transportation needs    Medical: Not on file  Non-medical: Not on file  Tobacco Use  . Smoking status: Current Every Day Smoker    Packs/day: 1.00    Types: Cigarettes  . Smokeless tobacco: Never Used  Substance and Sexual Activity  . Alcohol use: Yes    Comment: occasionally  . Drug use: Yes    Types: Marijuana    Comment: occasionally  . Sexual activity: Yes    Birth control/protection: None  Lifestyle  . Physical activity    Days per week: Not on file    Minutes per session: Not on file  . Stress: Not on file  Relationships  . Social Herbalist on phone: Not on file    Gets together: Not on file    Attends religious service: Not  on file    Active member of club or organization: Not on file    Attends meetings of clubs or organizations: Not on file    Relationship status: Not on file  . Intimate partner violence    Fear of current or ex partner: Not on file    Emotionally abused: Not on file    Physically abused: Not on file    Forced sexual activity: Not on file  Other Topics Concern  . Not on file  Social History Narrative   Lives at home with daughter, Joslyn Hy and fiance.   10-12 cups caffeine per day.   Right-handed.     PHYSICAL EXAM   Vitals:   06/26/19 0953  BP: 127/89  Pulse: 62  Temp: 98.2 F (36.8 C)  Weight: 188 lb 8 oz (85.5 kg)  Height: '5\' 6"'$  (1.676 m)    Not recorded      Body mass index is 30.42 kg/m.  PHYSICAL EXAMNIATION:  Gen: NAD, conversant, well nourised, well groomed                     Cardiovascular: Regular rate rhythm, no peripheral edema, warm, nontender. Eyes: Conjunctivae clear without exudates or hemorrhage Neck: Supple, no carotid bruits. Pulmonary: Clear to auscultation bilaterally   NEUROLOGICAL EXAM:  MENTAL STATUS: Speech:    Speech is normal; fluent and spontaneous with normal comprehension.  Cognition:     Orientation to time, place and person     Normal recent and remote memory     Normal Attention span and concentration     Normal Language, naming, repeating,spontaneous speech     Fund of knowledge   CRANIAL NERVES: CN II: Visual fields are full to confrontation.  Pupils are round equal and briskly reactive to light. CN III, IV, VI: extraocular movement are normal. No ptosis. CN V: Facial sensation is intact to pinprick in all 3 divisions bilaterally. Corneal responses are intact.  CN VII: Face is symmetric with normal eye closure and smile. CN VIII: Hearing is normal to causal conversation. CN IX, X: Palate elevates symmetrically. Phonation is normal. CN XI: Head turning and shoulder shrug are intact CN XII: Tongue is midline with normal  movements and no atrophy.  MOTOR: There is no pronator drift of out-stretched arms. Muscle bulk and tone are normal. Muscle strength is normal.  Mild limited range of motion of bilateral upper extremity,  REFLEXES: Reflexes are 2+ and symmetric at the biceps, triceps, knees, and ankles. Plantar responses are flexor.  SENSORY: Intact to light touch, pinprick, positional sensation and vibratory sensation are intact in fingers and toes.  COORDINATION: Rapid alternating movements and fine finger movements are intact. There is no dysmetria  on finger-to-nose and heel-knee-shin.    GAIT/STANCE: Posture is normal. Gait is steady with normal steps, base, arm swing, and turning. Heel and toe walking are normal. Tandem gait is normal.  Romberg is absent.   DIAGNOSTIC DATA (LABS, IMAGING, TESTING) - I reviewed patient records, labs, notes, testing and imaging myself where available.   ASSESSMENT AND PLAN  Dominique Hinton is a 48 y.o. female   Bilateral upper extremity pain, subjective weakness  Need to rule out polymyalgia rheumatica, inflammatory myopathy  Laboratory evaluations, including CPK, ESR C-reactive protein  EMG nerve conduction study  Cymbalta 60 mg daily   Marcial Pacas, M.D. Ph.D.  [facility] [address] [place], [place] [address] Ph: [phone] Fax: [phone]  CC: Referring Provider

## 2019-06-27 LAB — COMPREHENSIVE METABOLIC PANEL
ALT: 19 IU/L (ref 0–32)
AST: 14 IU/L (ref 0–40)
Albumin/Globulin Ratio: 1.6 (ref 1.2–2.2)
Albumin: 4 g/dL (ref 3.8–4.8)
Alkaline Phosphatase: 71 IU/L (ref 39–117)
BUN/Creatinine Ratio: 10 (ref 9–23)
BUN: 10 mg/dL (ref 6–24)
Bilirubin Total: 0.6 mg/dL (ref 0.0–1.2)
CO2: 24 mmol/L (ref 20–29)
Calcium: 9.4 mg/dL (ref 8.7–10.2)
Chloride: 100 mmol/L (ref 96–106)
Creatinine, Ser: 0.99 mg/dL (ref 0.57–1.00)
GFR calc Af Amer: 78 mL/min/{1.73_m2} (ref >59)
GFR calc non Af Amer: 68 mL/min/{1.73_m2} (ref >59)
Globulin, Total: 2.5 g/dL (ref 1.5–4.5)
Glucose: 77 mg/dL (ref 65–99)
Potassium: 3.6 mmol/L (ref 3.5–5.2)
Sodium: 140 mmol/L (ref 134–144)
Total Protein: 6.5 g/dL (ref 6.0–8.5)

## 2019-06-27 LAB — CBC WITH DIFFERENTIAL
Basophils Absolute: 0 10*3/uL (ref 0.0–0.2)
Basos: 1 %
EOS (ABSOLUTE): 0 10*3/uL (ref 0.0–0.4)
Eos: 0 %
Hematocrit: 44.9 % (ref 34.0–46.6)
Hemoglobin: 16.1 g/dL — ABNORMAL HIGH (ref 11.1–15.9)
Immature Grans (Abs): 0 10*3/uL (ref 0.0–0.1)
Immature Granulocytes: 0 %
Lymphocytes Absolute: 2.3 10*3/uL (ref 0.7–3.1)
Lymphs: 30 %
MCH: 32 pg (ref 26.6–33.0)
MCHC: 35.9 g/dL — ABNORMAL HIGH (ref 31.5–35.7)
MCV: 89 fL (ref 79–97)
Monocytes Absolute: 0.8 10*3/uL (ref 0.1–0.9)
Monocytes: 10 %
Neutrophils Absolute: 4.5 10*3/uL (ref 1.4–7.0)
Neutrophils: 59 %
RBC: 5.03 x10E6/uL (ref 3.77–5.28)
RDW: 13.8 % (ref 11.7–15.4)
WBC: 7.7 10*3/uL (ref 3.4–10.8)

## 2019-06-27 LAB — CK: Total CK: 24 U/L — ABNORMAL LOW (ref 32–182)

## 2019-06-27 LAB — ACETYLCHOLINE RECEPTOR, BINDING: AChR Binding Ab, Serum: <0.03 nmol/L (ref 0.00–0.24)

## 2019-06-27 LAB — TSH: TSH: 1.94 u[IU]/mL (ref 0.450–4.500)

## 2019-06-27 LAB — SEDIMENTATION RATE: Sed Rate: 29 mm/hr (ref 0–32)

## 2019-06-27 LAB — C-REACTIVE PROTEIN: CRP: 3 mg/L (ref 0–10)

## 2019-06-30 ENCOUNTER — Telehealth: Payer: Self-pay | Admitting: Neurology

## 2019-06-30 NOTE — Telephone Encounter (Signed)
Laboratory evaluation showed no significant abnormalities.

## 2019-06-30 NOTE — Telephone Encounter (Signed)
I spoke to the patient.  She is aware of the lab results below and verbalized understanding.

## 2019-08-13 ENCOUNTER — Other Ambulatory Visit: Payer: Self-pay

## 2019-08-13 ENCOUNTER — Ambulatory Visit (INDEPENDENT_AMBULATORY_CARE_PROVIDER_SITE_OTHER): Payer: Medicaid Other | Admitting: Neurology

## 2019-08-13 ENCOUNTER — Ambulatory Visit: Payer: Medicaid Other | Admitting: Neurology

## 2019-08-13 DIAGNOSIS — M79601 Pain in right arm: Secondary | ICD-10-CM

## 2019-08-13 DIAGNOSIS — M79602 Pain in left arm: Secondary | ICD-10-CM

## 2019-08-13 DIAGNOSIS — R531 Weakness: Secondary | ICD-10-CM

## 2019-08-13 DIAGNOSIS — Z0289 Encounter for other administrative examinations: Secondary | ICD-10-CM

## 2019-08-13 NOTE — Procedures (Signed)
Full Name: Dominique Hinton Gender: Female MRN #: [id number] Date of Birth: 22-Mar-1971    Visit Date: 08/13/2019 10:34 Age: 48 Years Examining Physician: Marcial Pacas, MD  Referring Physician: Marcial Pacas, MD History: 48 year old female presented with neck pain and bilateral shoulder pain.  Summary of the tests:   Nerve Conduction Study: Bilateral upper extremity motor and sensory examinations were normal  Electromyography: Selected needle examination of bilateral upper extremity muscles and bilateral cervical paraspinal muscles were normal.    Conclusion: This is a normal study.  There is no electrodiagnostic evidence of bilateral upper extremity neuropathy or bilateral cervical radiculopathy.    ------------------------------- Physician Name, M.D.  [facility] [place], [place] [address] Tel: [phone] Fax: [phone]         [facility]    Nerve / Sites Muscle Latency Ref. Amplitude Ref. Rel Amp Segments Distance Velocity Ref. Area    ms ms mV mV %  cm m/s m/s mVms  R Median - APB     Wrist APB 3.8 ?4.4 5.7 ?4.0 100 Wrist - APB 7   31.0     Upper arm APB 7.4  6.9  122 Upper arm - Wrist 18 50 ?49 31.4  L Median - APB     Wrist APB 3.3 ?4.4 9.0 ?4.0 100 Wrist - APB 7   43.2     Upper arm APB 6.9  8.2  91.6 Upper arm - Wrist 18 51 ?49 40.1  R Ulnar - ADM     Wrist ADM 2.6 ?3.3 10.7 ?6.0 100 Wrist - ADM 7   43.5     B.Elbow ADM 5.8  10.2  95 B.Elbow - Wrist 18 56 ?49 43.6     A.Elbow ADM 7.6  9.7  95.1 A.Elbow - B.Elbow 10 56 ?49 44.2         A.Elbow - Wrist      L Ulnar - ADM     Wrist ADM 3.1 ?3.3 5.7 ?6.0 100 Wrist - ADM 7   14.2     B.Elbow ADM 5.4  9.1  159 B.Elbow - Wrist 18 76 ?49 39.8     A.Elbow ADM 6.8  9.1  100 A.Elbow - B.Elbow 10 71 ?49 38.7         A.Elbow - Wrist                 SNC    Nerve / Sites Rec. Site Peak Lat Ref.  Amp Ref. Segments Distance Peak Diff Ref.    ms ms V V  cm ms ms  R Median, Ulnar -  Transcarpal comparison     Median Palm Wrist 2.1 ?2.2 78 ?35 Median Palm - Wrist 8       Ulnar Palm Wrist 2.0 ?2.2 29 ?12 Ulnar Palm - Wrist 8          Median Palm - Ulnar Palm  0.1 ?0.4  L Median, Ulnar - Transcarpal comparison     Median Palm Wrist 1.9 ?2.2 92 ?35 Median Palm - Wrist 8       Ulnar Palm Wrist 1.8 ?2.2 30 ?12 Ulnar Palm - Wrist 8          Median Palm - Ulnar Palm  0.1 ?0.4  R Median - Orthodromic (Dig II, Mid palm)     Dig II Wrist 3.1 ?3.4 13 ?10 Dig II - Wrist 13    L Median - Orthodromic (  Dig II, Mid palm)     Dig II Wrist 2.9 ?3.4 18 ?10 Dig II - Wrist 13    R Ulnar - Orthodromic, (Dig V, Mid palm)     Dig V Wrist 2.6 ?3.1 7 ?5 Dig V - Wrist 11    L Ulnar - Orthodromic, (Dig V, Mid palm)     Dig V Wrist 2.5 ?3.1 12 ?5 Dig V - Wrist 49                   F  Wave    Nerve F Lat Ref.   ms ms  R Ulnar - ADM 26.7 ?32.0  L Ulnar - ADM 26.3 ?32.0         EMG Summary Table    Spontaneous MUAP Recruitment  Muscle IA Fib PSW Fasc Other Amp Dur. Poly Pattern  L. First dorsal interosseous Normal None None None _______ Normal Normal Normal Normal  L. Pronator teres Normal None None None _______ Normal Normal Normal Normal  L. Biceps brachii Normal None None None _______ Normal Normal Normal Normal  L. Deltoid Normal None None None _______ Normal Normal Normal Normal  L. Triceps brachii Normal None None None _______ Normal Normal Normal Normal  R. First dorsal interosseous Normal None None None _______ Normal Normal Normal Normal  R. Pronator teres Normal None None None _______ Normal Normal Normal Normal  R. Biceps brachii Normal None None None _______ Normal Normal Normal Normal  R. Deltoid Normal None None None _______ Normal Normal Normal Normal  R. Triceps brachii Normal None None None _______ Normal Normal Normal Normal  R. Cervical paraspinals Normal None None None _______ Normal Normal Normal Normal  L. Cervical paraspinals Normal None None None _______ Normal  Normal Normal Normal

## 2020-02-24 ENCOUNTER — Encounter (HOSPITAL_COMMUNITY): Payer: Self-pay | Admitting: *Deleted

## 2020-02-24 ENCOUNTER — Other Ambulatory Visit: Payer: Self-pay

## 2020-02-24 ENCOUNTER — Emergency Department (HOSPITAL_COMMUNITY)
Admission: EM | Admit: 2020-02-24 | Discharge: 2020-02-24 | Disposition: A | Discharge status: Home / Self Care | Payer: Medicaid Other | Attending: Emergency Medicine | Admitting: Emergency Medicine

## 2020-02-24 DIAGNOSIS — R6883 Chills (without fever): Secondary | ICD-10-CM | POA: Diagnosis not present

## 2020-02-24 DIAGNOSIS — R519 Headache, unspecified: Secondary | ICD-10-CM | POA: Insufficient documentation

## 2020-02-24 DIAGNOSIS — R112 Nausea with vomiting, unspecified: Secondary | ICD-10-CM | POA: Insufficient documentation

## 2020-02-24 DIAGNOSIS — Z5321 Procedure and treatment not carried out due to patient leaving prior to being seen by health care provider: Secondary | ICD-10-CM | POA: Insufficient documentation

## 2020-02-24 LAB — BASIC METABOLIC PANEL
Anion gap: 12 (ref 5–15)
BUN: 7 mg/dL (ref 6–20)
CO2: 23 mmol/L (ref 22–32)
Calcium: 9 mg/dL (ref 8.9–10.3)
Chloride: 103 mmol/L (ref 98–111)
Creatinine, Ser: 0.9 mg/dL (ref 0.44–1.00)
GFR calc Af Amer: >60 mL/min (ref >60)
GFR calc non Af Amer: >60 mL/min (ref >60)
Glucose, Bld: 109 mg/dL — ABNORMAL HIGH (ref 70–99)
Potassium: 3.4 mmol/L — ABNORMAL LOW (ref 3.5–5.1)
Sodium: 138 mmol/L (ref 135–145)

## 2020-02-24 LAB — CBC WITH DIFFERENTIAL/PLATELET
Abs Immature Granulocytes: 0.03 10*3/uL (ref 0.00–0.07)
Basophils Absolute: 0.1 10*3/uL (ref 0.0–0.1)
Basophils Relative: 1 %
Eosinophils Absolute: 0.1 10*3/uL (ref 0.0–0.5)
Eosinophils Relative: 1 %
HCT: 47.6 % — ABNORMAL HIGH (ref 36.0–46.0)
Hemoglobin: 16.8 g/dL — ABNORMAL HIGH (ref 12.0–15.0)
Immature Granulocytes: 0 %
Lymphocytes Relative: 22 %
Lymphs Abs: 2 10*3/uL (ref 0.7–4.0)
MCH: 31.3 pg (ref 26.0–34.0)
MCHC: 35.3 g/dL (ref 30.0–36.0)
MCV: 88.8 fL (ref 80.0–100.0)
Monocytes Absolute: 0.7 10*3/uL (ref 0.1–1.0)
Monocytes Relative: 7 %
Neutro Abs: 6.5 10*3/uL (ref 1.7–7.7)
Neutrophils Relative %: 69 %
Platelets: 305 10*3/uL (ref 150–400)
RBC: 5.36 MIL/uL — ABNORMAL HIGH (ref 3.87–5.11)
RDW: 12.8 % (ref 11.5–15.5)
WBC: 9.3 10*3/uL (ref 4.0–10.5)
nRBC: 0 % (ref 0.0–0.2)

## 2020-02-24 NOTE — ED Triage Notes (Signed)
Pt with [facility] for past 2 days, did not take her HTn med yesterday did today.  Chills with N/V-emesis x 1.

## 2020-02-24 NOTE — ED Notes (Signed)
Called registration to ask if pt left, was told that pt had left awhile ago

## 2020-02-24 NOTE — ED Triage Notes (Signed)
Pt denies taking Covid vaccines.

## 2020-02-26 ENCOUNTER — Emergency Department (HOSPITAL_COMMUNITY): Payer: Medicaid Other

## 2020-02-26 ENCOUNTER — Other Ambulatory Visit: Payer: Self-pay

## 2020-02-26 ENCOUNTER — Emergency Department (HOSPITAL_COMMUNITY)
Admission: EM | Admit: 2020-02-26 | Discharge: 2020-02-26 | Disposition: A | Discharge status: Home / Self Care | Payer: Medicaid Other | Attending: Emergency Medicine | Admitting: Emergency Medicine

## 2020-02-26 ENCOUNTER — Encounter (HOSPITAL_COMMUNITY): Payer: Self-pay

## 2020-02-26 DIAGNOSIS — R519 Headache, unspecified: Secondary | ICD-10-CM | POA: Diagnosis not present

## 2020-02-26 DIAGNOSIS — R079 Chest pain, unspecified: Secondary | ICD-10-CM | POA: Diagnosis not present

## 2020-02-26 DIAGNOSIS — R42 Dizziness and giddiness: Secondary | ICD-10-CM | POA: Diagnosis not present

## 2020-02-26 DIAGNOSIS — R0789 Other chest pain: Secondary | ICD-10-CM | POA: Diagnosis not present

## 2020-02-26 DIAGNOSIS — Z5321 Procedure and treatment not carried out due to patient leaving prior to being seen by health care provider: Secondary | ICD-10-CM | POA: Insufficient documentation

## 2020-02-26 LAB — CBC
HCT: 50.2 % — ABNORMAL HIGH (ref 36.0–46.0)
Hemoglobin: 17.6 g/dL — ABNORMAL HIGH (ref 12.0–15.0)
MCH: 31.5 pg (ref 26.0–34.0)
MCHC: 35.1 g/dL (ref 30.0–36.0)
MCV: 90 fL (ref 80.0–100.0)
Platelets: 325 10*3/uL (ref 150–400)
RBC: 5.58 MIL/uL — ABNORMAL HIGH (ref 3.87–5.11)
RDW: 12.7 % (ref 11.5–15.5)
WBC: 7.6 10*3/uL (ref 4.0–10.5)
nRBC: 0 % (ref 0.0–0.2)

## 2020-02-26 LAB — BASIC METABOLIC PANEL
Anion gap: 13 (ref 5–15)
BUN: 9 mg/dL (ref 6–20)
CO2: 15 mmol/L — ABNORMAL LOW (ref 22–32)
Calcium: 9.5 mg/dL (ref 8.9–10.3)
Chloride: 107 mmol/L (ref 98–111)
Creatinine, Ser: 0.96 mg/dL (ref 0.44–1.00)
GFR calc Af Amer: >60 mL/min (ref >60)
GFR calc non Af Amer: >60 mL/min (ref >60)
Glucose, Bld: 122 mg/dL — ABNORMAL HIGH (ref 70–99)
Potassium: 3.1 mmol/L — ABNORMAL LOW (ref 3.5–5.1)
Sodium: 135 mmol/L (ref 135–145)

## 2020-02-26 LAB — I-STAT BETA HCG BLOOD, ED (MC, WL, AP ONLY): I-stat hCG, quantitative: <5 m[IU]/mL (ref <5)

## 2020-02-26 LAB — TROPONIN I (HIGH SENSITIVITY): Troponin I (High Sensitivity): 3 ng/L (ref <18)

## 2020-02-26 MED ORDER — SODIUM CHLORIDE 0.9% FLUSH: 3.0000 mL | Freq: Once | INTRAVENOUS | Status: DC

## 2020-02-26 NOTE — ED Notes (Signed)
Called for pt x3 with no response

## 2020-02-26 NOTE — ED Triage Notes (Signed)
Patient complains of positional vertigo with chills, headache and chest pain. Went to [facility] on Tuesday for same and due to the wait left prior to being evaluated. Patient describes the symptoms as intermittent. Patient alert and oriented, no neuro deficits, speech clear.

## 2020-04-01 DIAGNOSIS — M1389 Other specified arthritis, multiple sites: Secondary | ICD-10-CM | POA: Diagnosis not present

## 2020-05-05 DIAGNOSIS — M545 Low back pain: Secondary | ICD-10-CM | POA: Diagnosis not present

## 2020-05-21 DIAGNOSIS — M5416 Radiculopathy, lumbar region: Secondary | ICD-10-CM | POA: Diagnosis not present

## 2020-06-03 DIAGNOSIS — M5416 Radiculopathy, lumbar region: Secondary | ICD-10-CM | POA: Diagnosis not present

## 2020-06-26 DIAGNOSIS — M545 Low back pain, unspecified: Secondary | ICD-10-CM | POA: Diagnosis not present

## 2020-07-05 DIAGNOSIS — M545 Low back pain, unspecified: Secondary | ICD-10-CM | POA: Diagnosis not present

## 2020-07-12 DIAGNOSIS — M5416 Radiculopathy, lumbar region: Secondary | ICD-10-CM | POA: Diagnosis not present

## 2020-07-25 ENCOUNTER — Other Ambulatory Visit: Payer: Self-pay | Admitting: Neurology

## 2020-07-26 DIAGNOSIS — M5416 Radiculopathy, lumbar region: Secondary | ICD-10-CM | POA: Diagnosis not present

## 2020-08-09 DIAGNOSIS — M47816 Spondylosis without myelopathy or radiculopathy, lumbar region: Secondary | ICD-10-CM | POA: Diagnosis not present

## 2020-08-09 DIAGNOSIS — M5416 Radiculopathy, lumbar region: Secondary | ICD-10-CM | POA: Diagnosis not present

## 2020-08-19 DIAGNOSIS — M47816 Spondylosis without myelopathy or radiculopathy, lumbar region: Secondary | ICD-10-CM | POA: Diagnosis not present

## 2020-08-31 ENCOUNTER — Ambulatory Visit (HOSPITAL_COMMUNITY): Payer: Medicaid Other

## 2020-09-01 ENCOUNTER — Ambulatory Visit (HOSPITAL_COMMUNITY): Payer: Medicaid Other | Attending: Physical Medicine & Rehabilitation | Admitting: Physical Therapy

## 2020-09-01 ENCOUNTER — Other Ambulatory Visit: Payer: Self-pay

## 2020-09-01 ENCOUNTER — Encounter (HOSPITAL_COMMUNITY): Payer: Self-pay | Admitting: Physical Therapy

## 2020-09-01 DIAGNOSIS — G8929 Other chronic pain: Secondary | ICD-10-CM | POA: Diagnosis not present

## 2020-09-01 DIAGNOSIS — M6281 Muscle weakness (generalized): Secondary | ICD-10-CM | POA: Insufficient documentation

## 2020-09-01 DIAGNOSIS — R262 Difficulty in walking, not elsewhere classified: Secondary | ICD-10-CM | POA: Diagnosis not present

## 2020-09-01 DIAGNOSIS — M545 Low back pain, unspecified: Secondary | ICD-10-CM | POA: Diagnosis not present

## 2020-09-01 DIAGNOSIS — M5441 Lumbago with sciatica, right side: Secondary | ICD-10-CM | POA: Diagnosis not present

## 2020-09-01 NOTE — Therapy (Signed)
[facility] [address] [place], [place], [address] Phone: [phone]   Fax:  [phone]  Physical Therapy Evaluation  Patient Details  Name: Dominique Hinton MRN: [id number] Date of Birth: [birth year] Referring Provider (PT): Adele Schilder   Encounter Date: 09/01/2020   PT End of Session - 09/01/20 1302    Visit Number 1    Number of Visits 12    Date for PT Re-Evaluation 10/27/20    Authorization Type medicaid healthy blue- check auth, requested for first 3 visits,  IF RECERT IS NEEDED PERFORM ON 10th visit with progress note    Authorization - Visit Number 0    Authorization - Number of Visits 0    Progress Note Due on Visit 10    PT Start Time 1302    PT Stop Time 1338    PT Time Calculation (min) 36 min    Activity Tolerance Patient limited by pain    Behavior During Therapy [facility] for tasks assessed/performed           Past Medical History:  Diagnosis Date   Anxiety    Arthritis    Cervical pain    Hypertension     Past Surgical History:  Procedure Laterality Date   ABDOMINAL HYSTERECTOMY     APPENDECTOMY     BREAST SURGERY     CHOLECYSTECTOMY     TUBAL LIGATION      There were no vitals filed for this visit.    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 States she has had multiple injections and has had minor relief with them. States current pain is 8/10. States that rest makes it feel better and standing or sitting too long really bothers her. States riding in a car (driving) her right foot goes numb and is painful. States that her pain started for a couple years. States that she has a history of back and foot pain. States she is taking gabapentin, muscle relaxer, hydrocodone. States occasionally she has to use a rollator because her legs give out and she is in a lot of pain. 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 Moving and walking she can tolerate her pain. States she has the [employer] and would like to look into that    Pertinent History history of neck adn shoulder pain    Limitations Sitting;Walking;Standing;Lifting;House hold activities    Patient Stated Goals cleaning, able to stand and cook    Currently in Pain? Yes    Pain Score 8     Pain Location Back    Pain Orientation Lower;Right    Pain Descriptors / Indicators Aching;Shooting    Pain Type Chronic pain    Pain Radiating Towards down right leg backside of the leg, occassionally in the foot    Pain Onset More than a month ago    Pain Frequency Intermittent    Aggravating Factors  sitting or standing for long periods of time, driving    Pain Relieving Factors rest, medication              [facility] PT Assessment - 09/01/20 0001      Assessment   Medical Diagnosis LBP    Referring Provider (PT) Adele Schilder    Next MD Visit 09/21/20    Prior Therapy no recent therapy  Balance Screen   Has the patient fallen in the past 6 months No      Observation/Other Assessments   Focus on Therapeutic Outcomes (FOTO)  NA      ROM / Strength   AROM / PROM / Strength AROM;Strength      AROM   AROM Assessment Site Lumbar    Lumbar Flexion 100% limited   centralized pain   Lumbar Extension 25% limited   feels good   Lumbar - Right Side Bend 25% limited   pain down R leg   Lumbar - Left Side Bend 75% limited   centralized pain   Lumbar - Right Rotation 50% limited    Lumbar - Left Rotation 50% limited      Strength   Strength Assessment Site Hip;Knee;Ankle      Special Tests    Special Tests Lumbar    Lumbar Tests other;other2      other   Comments repeated flexion supine - increased pain in back and no change in radicular symptoms      other   Comment traction on green ball - increased pain getting into position- unable to determine if beneficial      Bed Mobility   Bed Mobility Sit to  Supine;Supine to Sit    Supine to Sit Supervision/Verbal cueing   very painful   Sit to Supine Supervision/Verbal cueing   very painful     Transfers   Transfers Sit to Stand;Stand to Sit    Sit to Stand 6: Modified independent (Device/Increase time);With upper extremity assist    Stand to Sit 6: Modified independent (Device/Increase time);With upper extremity assist      Ambulation/Gait   Ambulation/Gait Yes    Ambulation Distance (Feet) 254 Feet    Assistive device None    Gait Pattern Decreased step length - left;Decreased hip/knee flexion - right   right knee started bucklingafter 1:30   Ambulation Surface Level;Indoor    Gait Comments , stopped at 1:31 secondary to pain/weakness in R Leg                      Objective measurements completed on examination: See above findings.               PT Education - 09/01/20 1346    Education Details in current presentation, POC and mckenzie method    Person(s) Educated Patient    Methods Explanation    Comprehension Verbalized understanding            PT Short Term Goals - 09/01/20 1344      PT SHORT TERM GOAL #1   Title Patient will be able to walk 2 minutes without right leg giving out or feeling extremly weak to improve functional mobility    Time 4    Period Weeks    Status New    Target Date 09/29/20      PT SHORT TERM GOAL #2   Title Patient will be independent in self management strategies to improve quality of life and functional outcomes.    Time 4    Period Weeks    Status New    Target Date 09/29/20      PT SHORT TERM GOAL #3   Title Patient will report at least 50% improvement in overall symptoms and/or function to demonstrate improved functional mobility    Time 4    Period Weeks    Status New    Target Date 09/29/20  PT Long Term Goals - 09/01/20 1345      PT LONG TERM GOAL #1   Title Patient will be able to walk at least 300 feet in 2 minutes to demonstrate  improved ambulatory mobility    Time 8    Period Weeks    Status New    Target Date 10/27/20      PT LONG TERM GOAL #2   Title Patient will be able to stand for at least 10 minutes without severe pain to improve ability to cook bacon and meal prep    Baseline unable at this time    Time 8    Period Weeks    Status New    Target Date 10/27/20      PT LONG TERM GOAL #3   Title Patient will report at least 75% improvement in overall symptoms and/or function to demonstrate improved functional mobility    Time 8    Period Weeks    Status New    Target Date 10/27/20                  Plan - 09/01/20 1341    Clinical Impression Statement Patient with reports of chronic low back pain that radiates down right lower extremity. Severe pain noted with transitional movements and with walking or laying on back. No directional preference noted but flexion severely limited. Overall patients central nervous system is very upregulated and this is likely increasing overall pain and tolerance to therapy interventions on this date. 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 Patient would greatly benefit from skilled physical therapy to improve functional mobility and return her to optimal function.    Personal Factors and Comorbidities Comorbidity 1;Comorbidity 2;Comorbidity 3+    Comorbidities shoulder pain, neck pain    Examination-Activity Limitations Transfers;Lift;Locomotion Level;Bend;Sit;Sleep;Squat;Stairs;Stand    Examination-Participation Restrictions Meal Prep;Laundry;Driving;Community Activity;Shop;Cleaning    Stability/Clinical Decision Making Stable/Uncomplicated    Clinical Decision Making Low    Rehab Potential Good    PT Frequency Other (comment)   1-2x/week for total of 12 visits over 8 week certification   PT Duration 8 weeks    PT Treatment/Interventions ADLs/Self Care  Home Management;Aquatic Therapy;Electrical Stimulation;Iontophoresis 4mg /ml Dexamethasone;Moist Heat;Traction;DME Instruction;Neuromuscular re-education;Patient/family education;Manual techniques;Therapeutic exercise;Therapeutic activities;Functional mobility training;Stair training;Gait training;Dry needling;Joint Manipulations;Passive range of motion    PT Next Visit Plan assess lumbar extension/SB for directional preference, pain management strategies (estim/heat), manual was tolerated    PT Home Exercise Plan breathing exercise - long exhale           Patient will benefit from skilled therapeutic intervention in order to improve the following deficits and impairments:  Pain,Decreased mobility,Difficulty walking,Decreased range of motion,Decreased strength,Decreased activity tolerance,Decreased endurance,Postural dysfunction  Visit Diagnosis: Chronic midline low back pain with right-sided sciatica  Difficulty in walking, not elsewhere classified  Muscle weakness (generalized)     Problem List Patient Active Problem List   Diagnosis Date Noted   Pain in both upper extremities 06/26/2019   Weakness 06/26/2019   1:54 PM, 09/01/20 Jerene Pitch, DPT Physical Therapy with [facility]  [phone] office  [place], [place], [address] Phone: [phone]   Fax:  [phone]  Name: Dominique Hinton MRN: [id number] Date of Birth: 11/22/1970

## 2020-09-06 ENCOUNTER — Telehealth (HOSPITAL_COMMUNITY): Payer: Self-pay | Admitting: Physical Therapy

## 2020-09-06 ENCOUNTER — Encounter (HOSPITAL_COMMUNITY): Payer: Medicaid Other | Admitting: Physical Therapy

## 2020-09-06 NOTE — Telephone Encounter (Signed)
cx due to weather

## 2020-09-08 ENCOUNTER — Ambulatory Visit (HOSPITAL_COMMUNITY): Payer: Medicaid Other | Attending: Physical Medicine & Rehabilitation | Admitting: Physical Therapy

## 2020-09-08 ENCOUNTER — Encounter (HOSPITAL_COMMUNITY): Payer: Self-pay | Admitting: Physical Therapy

## 2020-09-08 ENCOUNTER — Other Ambulatory Visit: Payer: Self-pay

## 2020-09-08 DIAGNOSIS — M6281 Muscle weakness (generalized): Secondary | ICD-10-CM

## 2020-09-08 DIAGNOSIS — G8929 Other chronic pain: Secondary | ICD-10-CM | POA: Diagnosis not present

## 2020-09-08 DIAGNOSIS — M5441 Lumbago with sciatica, right side: Secondary | ICD-10-CM | POA: Insufficient documentation

## 2020-09-08 DIAGNOSIS — R262 Difficulty in walking, not elsewhere classified: Secondary | ICD-10-CM | POA: Diagnosis not present

## 2020-09-08 NOTE — Patient Instructions (Signed)
Access Code: [email or web address] URL: [email or web address] Date: 09/08/2020 Prepared by: Georges Lynch  Exercises Supine Transversus Abdominis Bracing - Hands on Stomach - 2-3 x daily - 7 x weekly - 2 sets - 10 reps - 5 second hold Prone Press Up on Elbows - 2-3 x daily - 7 x weekly - 1 sets - 3 reps - 60 second hold

## 2020-09-08 NOTE — Therapy (Signed)
[place], [place], [address] Phone: [phone]   Fax:  [phone]  Physical Therapy Treatment  Patient Details  Name: Dominique Hinton MRN: [id number] Date of Birth: 09-Apr-1971 Referring Provider (PT): Drema Pry   Encounter Date: 09/08/2020   PT End of Session - 09/08/20 1310    Visit Number 2    Number of Visits 12    Date for PT Re-Evaluation 10/27/20    Authorization Type medicaid healthy blue- check auth, requested for first 3 visits,  IF RECERT IS NEEDED PERFORM ON 10th visit with progress note    Authorization Time Period (still pending as of 09/08/20)    Authorization - Visit Number 0    Authorization - Number of Visits 0    Progress Note Due on Visit 10    PT Start Time 1303    PT Stop Time 1343    PT Time Calculation (min) 40 min    Activity Tolerance Patient tolerated treatment well    Behavior During Therapy [facility] for tasks assessed/performed           Past Medical History:  Diagnosis Date  . Anxiety   . Arthritis   . Cervical pain   . Hypertension     Past Surgical History:  Procedure Laterality Date  . ABDOMINAL HYSTERECTOMY    . APPENDECTOMY    . BREAST SURGERY    . CHOLECYSTECTOMY    . TUBAL LIGATION      There were no vitals filed for this visit.   Subjective Assessment - 09/08/20 1309    Subjective Patient says she did not hurt too bad the last few days, but attributes this to the fact that she has not had to do anything. Says she has been practicing her breathing which she is accustomed to already for the headaches that she gets.    Pertinent History history of neck adn shoulder pain    Limitations Sitting;Walking;Standing;Lifting;House hold activities    Patient Stated Goals cleaning, able to stand and cook    Currently in Pain? Yes    Pain Score 6     Pain Location Back    Pain Orientation Mid;Lower    Pain Descriptors / Indicators Aching    Pain Type Chronic pain     Pain Onset More than a month ago    Pain Frequency Intermittent                             [facility] Adult PT Treatment/Exercise - 09/08/20 0001      Exercises   Exercises Lumbar      Lumbar Exercises: Stretches   Prone on Elbows Stretch 3 reps;60 seconds    Other Lumbar Stretch Exercise prone lying 1 minute      Lumbar Exercises: Supine   Ab Set 5 seconds;15 reps    Other Supine Lumbar Exercises diaphragm breathing 3 min                  PT Education - 09/08/20 1346    Education Details on further assessments, purpose and function of added exercise with education on lumbar spine and ab muscle anatomy    Person(s) Educated Patient    Methods Explanation;Handout    Comprehension Verbalized understanding            PT Short Term Goals - 09/01/20 1344      PT SHORT TERM GOAL #1  Title Patient will be able to walk 2 minutes without right leg giving out or feeling extremly weak to improve functional mobility    Time 4    Period Weeks    Status New    Target Date 09/29/20      PT SHORT TERM GOAL #2   Title Patient will be independent in self management strategies to improve quality of life and functional outcomes.    Time 4    Period Weeks    Status New    Target Date 09/29/20      PT SHORT TERM GOAL #3   Title Patient will report at least 50% improvement in overall symptoms and/or function to demonstrate improved functional mobility    Time 4    Period Weeks    Status New    Target Date 09/29/20             PT Long Term Goals - 09/01/20 1345      PT LONG TERM GOAL #1   Title Patient will be able to walk at least 300 feet in 2 minutes to demonstrate improved ambulatory mobility    Time 8    Period Weeks    Status New    Target Date 10/27/20      PT LONG TERM GOAL #2   Title Patient will be able to stand for at least 10 minutes without severe pain to improve ability to cook bacon and meal prep    Baseline unable at this time     Time 8    Period Weeks    Status New    Target Date 10/27/20      PT LONG TERM GOAL #3   Title Patient will report at least 75% improvement in overall symptoms and/or function to demonstrate improved functional mobility    Time 8    Period Weeks    Status New    Target Date 10/27/20                 Plan - 09/08/20 1340    Clinical Impression Statement Patient tolerated session well overall today. Reviewed therapy goals and breathing technique from HEP. Continued assessment with repeated movements. Began with prone lying, patient tolerated well. Progressed this to prone on elbows. Patient noting increased pressure initially but then reduction of overall pain level from 6/10 to 4/10. Also added ab bracing for core strengthening. educated on purpose and function of all added activity today. Educated patient on performing log roll with ab brace for bed mobility. Patient issued updated HEP handout.    Personal Factors and Comorbidities Comorbidity 1;Comorbidity 2;Comorbidity 3+    Comorbidities shoulder pain, neck pain    Examination-Activity Limitations Transfers;Lift;Locomotion Level;Bend;Sit;Sleep;Squat;Stairs;Stand    Examination-Participation Restrictions Meal Prep;Laundry;Driving;Community Activity;Shop;Cleaning    Stability/Clinical Decision Making Stable/Uncomplicated    Rehab Potential Good    PT Frequency Other (comment)   1-2x/week for total of 12 visits over 8 week certification   PT Duration 8 weeks    PT Treatment/Interventions ADLs/Self Care Home Management;Aquatic Therapy;Electrical Stimulation;Iontophoresis 4mg /ml Dexamethasone;Moist Heat;Traction;DME Instruction;Neuromuscular re-education;Patient/family education;Manual techniques;Therapeutic exercise;Therapeutic activities;Functional mobility training;Stair training;Gait training;Dry needling;Joint Manipulations;Passive range of motion    PT Next Visit Plan Assess response to HEP with extension based exercise. Progress  core and hip strengthening as tolerated with emphasis on core activation and proper posturing/ body mechanics. Manuals as needed    PT Home Exercise Plan breathing exercise - long exhale 09/08/20: POE stretch, ab set    Consulted and Agree  with Plan of Care Patient           Patient will benefit from skilled therapeutic intervention in order to improve the following deficits and impairments:  Pain,Decreased mobility,Difficulty walking,Decreased range of motion,Decreased strength,Decreased activity tolerance,Decreased endurance,Postural dysfunction  Visit Diagnosis: Chronic midline low back pain with right-sided sciatica  Difficulty in walking, not elsewhere classified  Muscle weakness (generalized)     Problem List Patient Active Problem List   Diagnosis Date Noted  . Pain in both upper extremities 06/26/2019  . Weakness 06/26/2019    1:48 PM, 09/08/20 Josue Hector PT DPT  Physical Therapist with [facility]  [phone] [place] [address] [place], [place], [address] Phone: [phone]   Fax:  [phone]  Name: Dominique Hinton MRN: [id number] Date of Birth: 06-08-1971

## 2020-09-14 ENCOUNTER — Encounter (HOSPITAL_COMMUNITY): Payer: Self-pay | Admitting: Physical Therapy

## 2020-09-14 ENCOUNTER — Other Ambulatory Visit: Payer: Self-pay

## 2020-09-14 ENCOUNTER — Ambulatory Visit (HOSPITAL_COMMUNITY): Payer: Medicaid Other | Admitting: Physical Therapy

## 2020-09-14 DIAGNOSIS — M5441 Lumbago with sciatica, right side: Secondary | ICD-10-CM

## 2020-09-14 DIAGNOSIS — R262 Difficulty in walking, not elsewhere classified: Secondary | ICD-10-CM | POA: Diagnosis not present

## 2020-09-14 DIAGNOSIS — G8929 Other chronic pain: Secondary | ICD-10-CM | POA: Diagnosis not present

## 2020-09-14 DIAGNOSIS — M6281 Muscle weakness (generalized): Secondary | ICD-10-CM

## 2020-09-14 NOTE — Therapy (Signed)
[place], [place], [address] Phone: [phone]   Fax:  [phone]  Physical Therapy Treatment  Patient Details  Name: Dominique Hinton MRN: [id number] Date of Birth: [birth year] Referring Provider (PT): Drema Pry   Encounter Date: 09/14/2020   PT End of Session - 09/14/20 1305    Visit Number 3    Number of Visits 12    Date for PT Re-Evaluation 10/27/20    Authorization Type medicaid healthy blue- check auth, requested for first 3 visits,  REQUEST MORE VISITS NEXT SESSION IF RECERT IS NEEDED PERFORM ON 10th visit with progress note    Authorization Time Period (still pending as of 09/14/20)    Authorization - Visit Number 2    Authorization - Number of Visits 0    Progress Note Due on Visit 10    PT Start Time 1315    PT Stop Time 1355    PT Time Calculation (min) 40 min    Activity Tolerance Patient tolerated treatment well    Behavior During Therapy [facility] for tasks assessed/performed           Past Medical History:  Diagnosis Date  . Anxiety   . Arthritis   . Cervical pain   . Hypertension     Past Surgical History:  Procedure Laterality Date  . ABDOMINAL HYSTERECTOMY    . APPENDECTOMY    . BREAST SURGERY    . CHOLECYSTECTOMY    . TUBAL LIGATION      There were no vitals filed for this visit.   Subjective Assessment - 09/14/20 1318    Subjective States that her back hasn't been too bad the last two days but that her leg is having more symptoms. States that the nerve on the side of her right leg has been cramped up the last couple days and is currently about a 3/10 described as dull achy.    Pertinent History history of neck and shoulder pain    Limitations Sitting;Walking;Standing;Lifting;House hold activities    Patient Stated Goals cleaning, able to stand and cook    Currently in Pain? Yes    Pain Score 3     Pain Location Leg    Pain Orientation Right    Pain Descriptors /  Indicators Aching    Pain Onset More than a month ago              [facility] PT Assessment - 09/14/20 0001      Assessment   Medical Diagnosis LBP    Referring Provider (PT) Drema Pry    Next MD Visit 09/21/20    Prior Therapy no recent therapy                         [facility] Adult PT Treatment/Exercise - 09/14/20 0001      Lumbar Exercises: Standing   Other Standing Lumbar Exercises lumbar extension at counter x5      Lumbar Exercises: Seated   Other Seated Lumbar Exercises Ab activation with tactile cues- 5" holds - cues to breath throughout - 2 minutes of practice; seated traction x5 5" holds - painful on hands/arms    Other Seated Lumbar Exercises sciatic nerve glide seated 5x5 R      Lumbar Exercises: Prone   Other Prone Lumbar Exercises laying prone over towel - 5 minutes; prone press ups x5    Other Prone Lumbar Exercises femoral nerve glide R 4x5 (  with 5 DF/PF )                  PT Education - 09/14/20 1331    Education Details educated on lumbar support in car with towel roll at all times. on neural stretches and symptoms. in anatomy, intra-abdominal pressure.    Person(s) Educated Patient    Methods Explanation    Comprehension Verbalized understanding            PT Short Term Goals - 09/01/20 1344      PT SHORT TERM GOAL #1   Title Patient will be able to walk 2 minutes without right leg giving out or feeling extremly weak to improve functional mobility    Time 4    Period Weeks    Status New    Target Date 09/29/20      PT SHORT TERM GOAL #2   Title Patient will be independent in self management strategies to improve quality of life and functional outcomes.    Time 4    Period Weeks    Status New    Target Date 09/29/20      PT SHORT TERM GOAL #3   Title Patient will report at least 50% improvement in overall symptoms and/or function to demonstrate improved functional mobility    Time 4    Period Weeks    Status New     Target Date 09/29/20             PT Long Term Goals - 09/01/20 1345      PT LONG TERM GOAL #1   Title Patient will be able to walk at least 300 feet in 2 minutes to demonstrate improved ambulatory mobility    Time 8    Period Weeks    Status New    Target Date 10/27/20      PT LONG TERM GOAL #2   Title Patient will be able to stand for at least 10 minutes without severe pain to improve ability to cook bacon and meal prep    Baseline unable at this time    Time 8    Period Weeks    Status New    Target Date 10/27/20      PT LONG TERM GOAL #3   Title Patient will report at least 75% improvement in overall symptoms and/or function to demonstrate improved functional mobility    Time 8    Period Weeks    Status New    Target Date 10/27/20                 Plan - 09/14/20 1325    Clinical Impression Statement Patient with reduced symptoms with prone lying but increase in low back pain with transition back to standing. Trialed extension-based exercises again with mixed result. Tolerated core work and lumbar support well, instructed patient to use towel roll when sitting to reduce stress on lumbar spine. Trialed seated traction but this was not tolerated well in arms and shoulders, will trial supine traction next session to determine if traction would be beneficial.    Personal Factors and Comorbidities Comorbidity 1;Comorbidity 2;Comorbidity 3+    Comorbidities shoulder pain, neck pain    Examination-Activity Limitations Transfers;Lift;Locomotion Level;Bend;Sit;Sleep;Squat;Stairs;Stand    Examination-Participation Restrictions Meal Prep;Laundry;Driving;Community Activity;Shop;Cleaning    Stability/Clinical Decision Making Stable/Uncomplicated    Rehab Potential Good    PT Frequency Other (comment)   1-2x/week for total of 12 visits over 8 week certification   PT Duration 8  weeks    PT Treatment/Interventions ADLs/Self Care Home Management;Aquatic Therapy;Electrical  Stimulation;Iontophoresis 4mg /ml Dexamethasone;Moist Heat;Traction;DME Instruction;Neuromuscular re-education;Patient/family education;Manual techniques;Therapeutic exercise;Therapeutic activities;Functional mobility training;Stair training;Gait training;Dry needling;Joint Manipulations;Passive range of motion    PT Next Visit Plan Traction, Assess response to HEP with extension based exercise. Progress core and hip strengthening as tolerated with emphasis on core activation and proper posturing/ body mechanics. Manuals as needed    PT Home Exercise Plan breathing exercise - long exhale 09/08/20: POE stretch, ab set    Consulted and Agree with Plan of Care Patient           Patient will benefit from skilled therapeutic intervention in order to improve the following deficits and impairments:  Pain,Decreased mobility,Difficulty walking,Decreased range of motion,Decreased strength,Decreased activity tolerance,Decreased endurance,Postural dysfunction  Visit Diagnosis: Chronic midline low back pain with right-sided sciatica  Difficulty in walking, not elsewhere classified  Muscle weakness (generalized)     Problem List Patient Active Problem List   Diagnosis Date Noted  . Pain in both upper extremities 06/26/2019  . Weakness 06/26/2019   3:06 PM, 09/14/20 Dominique Hinton, Dominique Hinton Physical Therapy with [facility]  [phone] office  [place] [address] [place], [place], [address] Phone: [phone]   Fax:  [phone]  Name: Dominique Hinton MRN: [id number] Date of Birth: Jun 14, 1971

## 2020-09-16 ENCOUNTER — Encounter (HOSPITAL_COMMUNITY): Payer: Self-pay | Admitting: Physical Therapy

## 2020-09-16 ENCOUNTER — Other Ambulatory Visit: Payer: Self-pay

## 2020-09-16 ENCOUNTER — Ambulatory Visit (HOSPITAL_COMMUNITY): Payer: Medicaid Other | Admitting: Physical Therapy

## 2020-09-16 DIAGNOSIS — R262 Difficulty in walking, not elsewhere classified: Secondary | ICD-10-CM

## 2020-09-16 DIAGNOSIS — M6281 Muscle weakness (generalized): Secondary | ICD-10-CM | POA: Diagnosis not present

## 2020-09-16 DIAGNOSIS — M5441 Lumbago with sciatica, right side: Secondary | ICD-10-CM | POA: Diagnosis not present

## 2020-09-16 DIAGNOSIS — M47816 Spondylosis without myelopathy or radiculopathy, lumbar region: Secondary | ICD-10-CM | POA: Diagnosis not present

## 2020-09-16 DIAGNOSIS — G8929 Other chronic pain: Secondary | ICD-10-CM

## 2020-09-16 NOTE — Therapy (Signed)
[facility] [place], [place], [address] Phone: [phone]   Fax:  [phone]  Physical Therapy Treatment  Patient Details  Name: Dominique Hinton MRN: [id number] Date of Birth: 12-28-1970 Referring Provider (PT): Drema Pry   Encounter Date: 09/16/2020   PT End of Session - 09/16/20 1352    Visit Number 4    Number of Visits 12    Date for PT Re-Evaluation 10/27/20    Authorization Type medicaid healthy blue- check auth, requested for first 3 visits, requested next 8 visits on 0/62/37, IF RECERT IS NEEDED PERFORM ON 10th visit with progress note    Authorization Time Period (still pending as of 09/16/20)    Authorization - Visit Number 3    Authorization - Number of Visits 0    Progress Note Due on Visit 10    PT Start Time 1315    PT Stop Time 1353    PT Time Calculation (min) 38 min    Activity Tolerance Patient tolerated treatment well    Behavior During Therapy [facility] for tasks assessed/performed           Past Medical History:  Diagnosis Date  . Anxiety   . Arthritis   . Cervical pain   . Hypertension     Past Surgical History:  Procedure Laterality Date  . ABDOMINAL HYSTERECTOMY    . APPENDECTOMY    . BREAST SURGERY    . CHOLECYSTECTOMY    . TUBAL LIGATION      There were no vitals filed for this visit.   Subjective Assessment - 09/16/20 1318    Subjective States that she has been in the car since 9am this morning and her back is really bothering her. States she tried the lumbar roll in the car but it didn't' work. Current pain is 5/10 in the back and going down the leg.    Pertinent History history of neck and shoulder pain    Limitations Sitting;Walking;Standing;Lifting;House hold activities    Patient Stated Goals cleaning, able to stand and cook    Currently in Pain? Yes    Pain Score 5     Pain Location Back    Pain Orientation Right    Pain Descriptors / Indicators Aching;Dull     Pain Type Chronic pain    Pain Onset More than a month ago              [facility] PT Assessment - 09/16/20 0001      Assessment   Medical Diagnosis LBP    Referring Provider (PT) Drema Pry    Next MD Visit 09/21/20    Prior Therapy no recent therapy                         [facility] Adult PT Treatment/Exercise - 09/16/20 0001      Lumbar Exercises: Stretches   Other Lumbar Stretch Exercise standing trunk 4 way after completeing standing traction x10 all directions.      Lumbar Exercises: Standing   Other Standing Lumbar Exercises self traction over table  5 minutes x3      Lumbar Exercises: Supine   Ab Set 5 seconds;15 reps    Other Supine Lumbar Exercises hooklying with adduction squeeze 10x5 sec holds    Other Supine Lumbar Exercises self traction with hands - no pressure felt - stopped  PT Education - 09/16/20 1325    Education Details on traction and aquatics - benefits and how it will help with POC    Person(s) Educated Patient    Methods Explanation    Comprehension Verbalized understanding            PT Short Term Goals - 09/01/20 1344      PT SHORT TERM GOAL #1   Title Patient will be able to walk 2 minutes without right leg giving out or feeling extremly weak to improve functional mobility    Time 4    Period Weeks    Status New    Target Date 09/29/20      PT SHORT TERM GOAL #2   Title Patient will be independent in self management strategies to improve quality of life and functional outcomes.    Time 4    Period Weeks    Status New    Target Date 09/29/20      PT SHORT TERM GOAL #3   Title Patient will report at least 50% improvement in overall symptoms and/or function to demonstrate improved functional mobility    Time 4    Period Weeks    Status New    Target Date 09/29/20             PT Long Term Goals - 09/01/20 1345      PT LONG TERM GOAL #1   Title Patient will be able to walk at least 300  feet in 2 minutes to demonstrate improved ambulatory mobility    Time 8    Period Weeks    Status New    Target Date 10/27/20      PT LONG TERM GOAL #2   Title Patient will be able to stand for at least 10 minutes without severe pain to improve ability to cook bacon and meal prep    Baseline unable at this time    Time 8    Period Weeks    Status New    Target Date 10/27/20      PT LONG TERM GOAL #3   Title Patient will report at least 75% improvement in overall symptoms and/or function to demonstrate improved functional mobility    Time 8    Period Weeks    Status New    Target Date 10/27/20                 Plan - 09/16/20 1347    Clinical Impression Statement Tolerated traction well but patient unable to perform self tractin techniques with arms secondary to arm/shoulder pain.  Added leaning over bed to HEP. Educated patient on benefits of traction, aquatic therapy and pressures on lumbar spine in different positions.  Will follow up with aquatics in future session if patient continued to have poor land tolerance.    Personal Factors and Comorbidities Comorbidity 1;Comorbidity 2;Comorbidity 3+    Comorbidities shoulder pain, neck pain    Examination-Activity Limitations Transfers;Lift;Locomotion Level;Bend;Sit;Sleep;Squat;Stairs;Stand    Examination-Participation Restrictions Meal Prep;Laundry;Driving;Community Activity;Shop;Cleaning    Stability/Clinical Decision Making Stable/Uncomplicated    Rehab Potential Good    PT Frequency Other (comment)   1-2x/week for total of 12 visits over 8 week certification   PT Duration 8 weeks    PT Treatment/Interventions ADLs/Self Care Home Management;Aquatic Therapy;Electrical Stimulation;Iontophoresis 4mg /ml Dexamethasone;Moist Heat;Traction;DME Instruction;Neuromuscular re-education;Patient/family education;Manual techniques;Therapeutic exercise;Therapeutic activities;Functional mobility training;Stair training;Gait training;Dry  needling;Joint Manipulations;Passive range of motion    PT Next Visit Plan FOLLOW UP ON POTENTIAL AQUATICS. Traction,  Assess response to HEP with extension based exercise. Progress core and hip strengthening as tolerated with emphasis on core activation and proper posturing/ body mechanics. Manuals as needed    PT Home Exercise Plan breathing exercise - long exhale 09/08/20: POE stretch, ab set; 1/13 prone traction    Consulted and Agree with Plan of Care Patient           Patient will benefit from skilled therapeutic intervention in order to improve the following deficits and impairments:  Pain,Decreased mobility,Difficulty walking,Decreased range of motion,Decreased strength,Decreased activity tolerance,Decreased endurance,Postural dysfunction  Visit Diagnosis: Chronic midline low back pain with right-sided sciatica  Difficulty in walking, not elsewhere classified  Muscle weakness (generalized)     Problem List Patient Active Problem List   Diagnosis Date Noted  . Pain in both upper extremities 06/26/2019  . Weakness 06/26/2019    2:14 PM, 09/16/20 Jerene Pitch, DPT Physical Therapy with [facility]  [phone] office   [place] [address] [place], [place], [address] Phone: [phone]   Fax:  [phone]  Name: Dominique Hinton MRN: [id number] Date of Birth: [birth year]

## 2020-09-20 ENCOUNTER — Other Ambulatory Visit: Payer: Self-pay

## 2020-09-20 ENCOUNTER — Emergency Department (HOSPITAL_COMMUNITY): Payer: Medicaid Other

## 2020-09-20 ENCOUNTER — Inpatient Hospital Stay (HOSPITAL_COMMUNITY)
Admission: EM | Admit: 2020-09-20 | Discharge: 2020-09-22 | DRG: 251 | Disposition: A | Discharge status: Home / Self Care | Payer: Medicaid Other | Attending: Interventional Cardiology | Admitting: Interventional Cardiology

## 2020-09-20 ENCOUNTER — Encounter (HOSPITAL_COMMUNITY): Payer: Self-pay

## 2020-09-20 ENCOUNTER — Ambulatory Visit (HOSPITAL_COMMUNITY): Payer: Medicaid Other | Admitting: Physical Therapy

## 2020-09-20 DIAGNOSIS — I10 Essential (primary) hypertension: Secondary | ICD-10-CM | POA: Diagnosis present

## 2020-09-20 DIAGNOSIS — I959 Hypotension, unspecified: Secondary | ICD-10-CM | POA: Diagnosis not present

## 2020-09-20 DIAGNOSIS — Z881 Allergy status to other antibiotic agents status: Secondary | ICD-10-CM

## 2020-09-20 DIAGNOSIS — Z888 Allergy status to other drugs, medicaments and biological substances status: Secondary | ICD-10-CM

## 2020-09-20 DIAGNOSIS — R079 Chest pain, unspecified: Secondary | ICD-10-CM

## 2020-09-20 DIAGNOSIS — Z20822 Contact with and (suspected) exposure to covid-19: Secondary | ICD-10-CM | POA: Diagnosis present

## 2020-09-20 DIAGNOSIS — M549 Dorsalgia, unspecified: Secondary | ICD-10-CM | POA: Diagnosis present

## 2020-09-20 DIAGNOSIS — I214 Non-ST elevation (NSTEMI) myocardial infarction: Secondary | ICD-10-CM | POA: Diagnosis not present

## 2020-09-20 DIAGNOSIS — R110 Nausea: Secondary | ICD-10-CM | POA: Diagnosis not present

## 2020-09-20 DIAGNOSIS — I499 Cardiac arrhythmia, unspecified: Secondary | ICD-10-CM | POA: Diagnosis not present

## 2020-09-20 DIAGNOSIS — Z720 Tobacco use: Secondary | ICD-10-CM

## 2020-09-20 DIAGNOSIS — R0789 Other chest pain: Secondary | ICD-10-CM | POA: Diagnosis not present

## 2020-09-20 DIAGNOSIS — R0689 Other abnormalities of breathing: Secondary | ICD-10-CM | POA: Diagnosis not present

## 2020-09-20 DIAGNOSIS — Z820 Family history of epilepsy and other diseases of the nervous system: Secondary | ICD-10-CM

## 2020-09-20 DIAGNOSIS — Z8249 Family history of ischemic heart disease and other diseases of the circulatory system: Secondary | ICD-10-CM

## 2020-09-20 DIAGNOSIS — Z9861 Coronary angioplasty status: Secondary | ICD-10-CM

## 2020-09-20 DIAGNOSIS — Z79899 Other long term (current) drug therapy: Secondary | ICD-10-CM

## 2020-09-20 DIAGNOSIS — G8929 Other chronic pain: Secondary | ICD-10-CM | POA: Diagnosis present

## 2020-09-20 DIAGNOSIS — F419 Anxiety disorder, unspecified: Secondary | ICD-10-CM | POA: Diagnosis present

## 2020-09-20 DIAGNOSIS — Z791 Long term (current) use of non-steroidal anti-inflammatories (NSAID): Secondary | ICD-10-CM

## 2020-09-20 DIAGNOSIS — R778 Other specified abnormalities of plasma proteins: Secondary | ICD-10-CM

## 2020-09-20 DIAGNOSIS — F17210 Nicotine dependence, cigarettes, uncomplicated: Secondary | ICD-10-CM | POA: Diagnosis present

## 2020-09-20 DIAGNOSIS — E785 Hyperlipidemia, unspecified: Secondary | ICD-10-CM | POA: Diagnosis present

## 2020-09-20 DIAGNOSIS — E669 Obesity, unspecified: Secondary | ICD-10-CM | POA: Diagnosis present

## 2020-09-20 DIAGNOSIS — Z6832 Body mass index (BMI) 32.0-32.9, adult: Secondary | ICD-10-CM

## 2020-09-20 DIAGNOSIS — Z90710 Acquired absence of both cervix and uterus: Secondary | ICD-10-CM

## 2020-09-20 HISTORY — DX: Family history of other specified conditions: Z84.89

## 2020-09-20 LAB — BASIC METABOLIC PANEL
Anion gap: 7 (ref 5–15)
BUN: 8 mg/dL (ref 6–20)
CO2: 22 mmol/L (ref 22–32)
Calcium: 9.1 mg/dL (ref 8.9–10.3)
Chloride: 107 mmol/L (ref 98–111)
Creatinine, Ser: 0.82 mg/dL (ref 0.44–1.00)
GFR, Estimated: >60 mL/min (ref >60)
Glucose, Bld: 99 mg/dL (ref 70–99)
Potassium: 3.5 mmol/L (ref 3.5–5.1)
Sodium: 136 mmol/L (ref 135–145)

## 2020-09-20 LAB — CBC WITH DIFFERENTIAL/PLATELET
Abs Immature Granulocytes: 0.02 10*3/uL (ref 0.00–0.07)
Basophils Absolute: 0 10*3/uL (ref 0.0–0.1)
Basophils Relative: 1 %
Eosinophils Absolute: 0 10*3/uL (ref 0.0–0.5)
Eosinophils Relative: 0 %
HCT: 42.9 % (ref 36.0–46.0)
Hemoglobin: 15.4 g/dL — ABNORMAL HIGH (ref 12.0–15.0)
Immature Granulocytes: 0 %
Lymphocytes Relative: 22 %
Lymphs Abs: 1.7 10*3/uL (ref 0.7–4.0)
MCH: 31.8 pg (ref 26.0–34.0)
MCHC: 35.9 g/dL (ref 30.0–36.0)
MCV: 88.6 fL (ref 80.0–100.0)
Monocytes Absolute: 0.6 10*3/uL (ref 0.1–1.0)
Monocytes Relative: 8 %
Neutro Abs: 5.3 10*3/uL (ref 1.7–7.7)
Neutrophils Relative %: 69 %
Platelets: 271 10*3/uL (ref 150–400)
RBC: 4.84 MIL/uL (ref 3.87–5.11)
RDW: 12.5 % (ref 11.5–15.5)
WBC: 7.7 10*3/uL (ref 4.0–10.5)
nRBC: 0 % (ref 0.0–0.2)

## 2020-09-20 LAB — TROPONIN I (HIGH SENSITIVITY)
Troponin I (High Sensitivity): 7 ng/L (ref <18)
Troponin I (High Sensitivity): 86 ng/L — ABNORMAL HIGH (ref <18)
Troponin I (High Sensitivity): 825 ng/L (ref <18)

## 2020-09-20 LAB — RESP PANEL BY RT-PCR (FLU A&B, COVID) ARPGX2
Influenza A by PCR: NEGATIVE
Influenza B by PCR: NEGATIVE
SARS Coronavirus 2 by RT PCR: NEGATIVE

## 2020-09-20 MED ORDER — HEPARIN (PORCINE) 25000 UT/250ML-% IV SOLN
1250.0000 [IU]/h | INTRAVENOUS | Status: DC
  Administered 2020-09-20: 22:00:00 950 [IU]/h via INTRAVENOUS
  Filled 2020-09-20 (×2): qty 250

## 2020-09-20 MED ORDER — NITROGLYCERIN 0.4 MG SL SUBL
0.4000 mg | SUBLINGUAL_TABLET | Freq: Once | SUBLINGUAL | Status: AC
  Administered 2020-09-20: 0.4 mg via SUBLINGUAL
  Filled 2020-09-20: qty 1

## 2020-09-20 MED ORDER — ALUM & MAG HYDROXIDE-SIMETH 200-200-20 MG/5ML PO SUSP
30.0000 mL | Freq: Once | ORAL | Status: AC
  Administered 2020-09-20: 30 mL via ORAL
  Filled 2020-09-20: qty 30

## 2020-09-20 MED ORDER — NITROGLYCERIN IN D5W 200-5 MCG/ML-% IV SOLN
5.0000 ug/min | INTRAVENOUS | Status: DC
  Administered 2020-09-20: 5 ug/min via INTRAVENOUS
  Filled 2020-09-20: qty 250

## 2020-09-20 MED ORDER — NITROGLYCERIN 0.4 MG SL SUBL: 0.4000 mg | SUBLINGUAL_TABLET | SUBLINGUAL | Status: DC | PRN

## 2020-09-20 MED ORDER — LOSARTAN POTASSIUM 50 MG PO TABS
100.0000 mg | ORAL_TABLET | Freq: Every day | ORAL | Status: DC
  Administered 2020-09-21 – 2020-09-22 (×2): 100 mg via ORAL
  Filled 2020-09-20 (×2): qty 2

## 2020-09-20 MED ORDER — HEPARIN BOLUS VIA INFUSION
4000.0000 [IU] | Freq: Once | INTRAVENOUS | Status: AC
  Administered 2020-09-20: 4000 [IU] via INTRAVENOUS

## 2020-09-20 MED ORDER — LIDOCAINE VISCOUS HCL 2 % MT SOLN
15.0000 mL | Freq: Once | OROMUCOSAL | Status: AC
  Administered 2020-09-20: 15 mL via ORAL
  Filled 2020-09-20: qty 15

## 2020-09-20 MED ORDER — MORPHINE SULFATE (PF) 4 MG/ML IV SOLN
4.0000 mg | Freq: Once | INTRAVENOUS | Status: AC
  Administered 2020-09-20: 4 mg via INTRAVENOUS
  Filled 2020-09-20: qty 1

## 2020-09-20 MED ORDER — MELOXICAM 7.5 MG PO TABS
15.0000 mg | ORAL_TABLET | Freq: Once | ORAL | Status: AC
  Administered 2020-09-21: 15 mg via ORAL
  Filled 2020-09-20 (×2): qty 2

## 2020-09-20 MED ORDER — HYDROCHLOROTHIAZIDE 25 MG PO TABS: 25.0000 mg | ORAL_TABLET | Freq: Every day | ORAL | Status: DC

## 2020-09-20 MED ORDER — ONDANSETRON HCL 4 MG/2ML IJ SOLN
4.0000 mg | Freq: Once | INTRAMUSCULAR | Status: AC
  Administered 2020-09-20: 4 mg via INTRAVENOUS
  Filled 2020-09-20: qty 2

## 2020-09-20 NOTE — Discharge Instructions (Addendum)

## 2020-09-20 NOTE — H&P (Incomplete)
History and Physical  Dominique Hinton KKX:[id number] DOB: [birth year] DOA: 09/20/2020  Referring physician: Nettie Elm, PA-C PCP: Asencion Noble, MD  Patient coming from: Home  Chief Complaint: Chest Pain  HPI: Dominique Hinton is a 50 y.o. female with medical history significant for Hypertension, tobacco abuse and Obesity who presents to the emergency department due to sudden onset of midsternal/left-sided chest pain that woke her up from sleep this afternoon around 2 PM.  Chest pain was described as pressure-like with a sharp component, it was unreproducible and was associated with nausea without vomiting or diaphoresis.  Chest pain was rated as 6/10 on pain scale.  She complains of chronic left shoulder pain, and she was not sure if the chest pain radiated to left shoulder, but the pain was mainly felt underneath the left breast.  Pain was intermittent and after about 1 hour of chest pain not resolving, EMS was activated, on arrival of EMS team, nitroglycerin sublingual was given with only mild relief of chest pain and she was taken to the ED for further evaluation and management.   ED Course: In the emergency department, she was hemodynamically stable.  Work-up in the ED showed normal CBC and BMP except for elevated hemoglobin at 15.4.  Troponin x2 -7 > 86.  Chest x-ray showed no active cardiopulmonary disease.  She was treated with sublingual nitroglycerin and IV morphine with improvement in chest pain (now rated as 3/10 on pain scale).  Cardiology fellow at [facility] was consulted and recommended heparin drip and nitroglycerin and to consult cardiology in the morning per ED PA.  Hospitalist was asked admit patient for further evaluation and management.  Review of Systems: Constitutional: Negative for chills and fever.  HENT: Negative for ear pain and sore throat.   Eyes: Negative for pain and visual disturbance.  Respiratory: Negative for cough, chest tightness and shortness of breath.    Cardiovascular: Positive for chest pain and negative for palpitations.  Gastrointestinal: Negative for abdominal pain and vomiting.  Endocrine: Negative for polyphagia and polyuria.  Genitourinary: Negative for decreased urine volume, dysuria, enuresis Musculoskeletal: Negative for arthralgias and back pain.  Skin: Negative for color change and rash.  Allergic/Immunologic: Negative for immunocompromised state.  Neurological: Negative for tremors, syncope, speech difficulty, weakness, light-headedness and headaches.  Hematological: Does not bruise/bleed easily.  All other systems reviewed and are negative   Past Medical History:  Diagnosis Date  . Anxiety   . Arthritis   . Cervical pain   . Hypertension    Past Surgical History:  Procedure Laterality Date  . ABDOMINAL HYSTERECTOMY    . APPENDECTOMY    . BREAST SURGERY    . CHOLECYSTECTOMY    . TUBAL LIGATION      Social History:  reports that she has been smoking cigarettes. She has been smoking about 1.00 pack per day. She has never used smokeless tobacco. She reports current alcohol use. She reports current drug use. Drug: Marijuana.   Allergies  Allergen Reactions  . Cortisone Shortness Of Breath and Other (See Comments)    Made me feel loopy  . Doxycycline Rash    Family History  Problem Relation Age of Onset  . Other Mother        "borderline diabetic"  . Alzheimer's disease Father   . Hypertension Other     ***  Prior to Admission medications   Medication Sig Start Date End Date Taking? Authorizing Provider  citalopram (CELEXA) 20 MG tablet Take 20  mg by mouth daily. 01/21/18   [provider]  DULoxetine (CYMBALTA) 60 MG capsule Take 1 capsule (60 mg total) by mouth daily. 06/26/19   Marcial Pacas, MD  hydrochlorothiazide (HYDRODIURIL) 25 MG tablet Take 25 mg by mouth daily. 01/26/19   [provider]  losartan (COZAAR) 100 MG tablet Take 100 mg by mouth daily. 01/26/19   [provider]  meloxicam (MOBIC) 15 MG tablet Take 15 mg by mouth daily. 12/08/18   [provider]  valACYclovir (VALTREX) 1000 MG tablet Take 1,000 mg by mouth daily. 05/06/19   [provider]    Physical Exam: BP 124/78 (BP Location: Right Arm)   Pulse 63   Temp 98.3 F (36.8 C) (Oral)   Resp 17   Ht 5\' 6"  (1.676 m)   Wt 90.7 kg   SpO2 100%   BMI 32.28 kg/m   . General: 50 y.o. year-old female well developed well nourished in no acute distress.  Alert and oriented x3. Marland Kitchen HEENT: NCAT, EOMI . Neck: Supple, Trachea medial . Cardiovascular: Regular rate and rhythm with no rubs or gallops.  No thyromegaly or JVD noted.  2/4 pulses in all 4 extremities. Marland Kitchen Respiratory: Clear to auscultation with no wheezes or rales. Good inspiratory effort. . Abdomen: Soft nontender nondistended with normal bowel sounds x4 quadrants. . Muskuloskeletal: No cyanosis, clubbing or edema noted bilaterally . Neuro: CN II-XII intact, strength, sensation, reflexes . Skin: No ulcerative lesions noted or rashes . Psychiatry: Judgement and insight appear normal. Mood is appropriate for condition and setting          Labs on Admission:  Basic Metabolic Panel: Recent Labs  Lab 09/20/20 1632  NA 136  K 3.5  CL 107  CO2 22  GLUCOSE 99  BUN 8  CREATININE 0.82  CALCIUM 9.1   Liver Function Tests: No results for input(s): AST, ALT, ALKPHOS, BILITOT, PROT, ALBUMIN in the last 168 hours. No results for input(s): LIPASE, AMYLASE in the last 168 hours. No results for input(s): AMMONIA in the last 168 hours. CBC: Recent Labs  Lab 09/20/20 1632  WBC 7.7  NEUTROABS 5.3  HGB 15.4*  HCT 42.9  MCV 88.6  PLT 271   Cardiac Enzymes: No results for input(s): CKTOTAL, CKMB, CKMBINDEX, TROPONINI in the last 168 hours.  BNP (last 3 results) No results for input(s): BNP in the last 8760 hours.  ProBNP (last 3 results) No results for input(s): PROBNP in the last 8760 hours.  CBG: No results for  input(s): GLUCAP in the last 168 hours.  Radiological Exams on Admission: DG Chest 2 View  Result Date: 09/20/2020 CLINICAL DATA:  Chest pressure and nausea EXAM: CHEST - 2 VIEW COMPARISON:  02/26/2020 FINDINGS: Midline trachea.  Normal heart size and mediastinal contours. Sharp costophrenic angles.  No pneumothorax.  Clear lungs. Numerous leads and wires project over the chest. IMPRESSION: No active cardiopulmonary disease. Electronically Signed   By: Abigail Miyamoto M.D.   On: 09/20/2020 17:39    EKG: I independently viewed the EKG done and my findings are as followed: Normal sinus rhythm at a rate of 74bpm  Assessment/Plan Present on Admission: **None**  Principal Problem:   Chest pain Active Problems:   Obesity (BMI 30.0-34.9)   Essential hypertension  Chest rule out ACS/elevated troponin Continue telemetry  Troponins x 2 - 7 > 86; continue to trend troponin IV heparin drip was started as recommended by [facility] cardiology fellow by ED PA EKG shows normal  sinus rhythm at a rate of 74 bpm Consult cardiology to help decide if Stress test is needed in am Versus other diagnostic modalities.    Continue aspirin, nitroglycerin  Essential hypertension (controlled) Continue Cozaar and HCTZ  Obesity (BMI 32.28) Patient was counseled on diet and lifestyle modification   Tobacco abuse Patient has 30-year smoking history She was counseled on tobacco abuse cessation  DVT prophylaxis: Heparin drip  Code Status: Full code  Family Communication: None at bedside  Disposition Plan:  Patient is from:                        home Anticipated DC to:                    home Anticipated DC date:                1 day Anticipated DC barriers:           Patient is unstable to be discharged at this time due to having chest pain with elevated troponin and requiring cardiology consult  Consults called: Cardiology  Admission status: Observation    Bernadette Hoit MD [facility]  09/20/2020, 9:49 PM

## 2020-09-20 NOTE — ED Provider Notes (Signed)
[facility] EMERGENCY DEPARTMENT Provider Note   CSN: [id number] Arrival date & time: 09/20/20  1554    History Chief Complaint  Patient presents with  . Chest Pain    Dominique Hinton is a 50 y.o. female with past medical history significant for hypertension who presents for evaluation of chest pain.  Patient states she had chest pain x2 hours underneath her left breast.  Nitro given by EMS as well as aspirin.  States this is not helped her pain. Has been intermittent since it started.  She felt she had to "burp" however was not able to.  She rates her current pain a 6/10.  Does not radiate.  She has no exertional or pleuritic chest pain.   No unilateral leg swelling, redness, warmth, history of PE or DVT.  Has never had any cardiac procedures.  States she was admitted approximately 10 years ago for cardiac work-up however "it was nothing and they did not do anything and I was sent home."  No one in the house has been sick.  She denies fever, chills, nausea, vomiting, shortness of breath, abdominal pain, diarrhea, dysuria, unilateral leg swelling, redness or warmth.  Patient states that she was curling up on her left side during a nap ans when she awoke was when she had her pain. Did have some left arm pain then however no left arm pain now. Denies additional aggravating or relieving factors.  History obtained from patient and past medical records.  No interpreter used.  HPI  HPI: A 50 year old patient with a history of hypertension and obesity presents for evaluation of chest pain. Initial onset of pain was approximately 1-3 hours ago. The patient's chest pain is described as heaviness/pressure/tightness and is not worse with exertion. The patient's chest pain is middle- or left-sided, is not well-localized, is not sharp and does not radiate to the arms/jaw/neck. The patient does not complain of nausea and denies diaphoresis. The patient has no history of stroke, has no history of peripheral  artery disease, has not smoked in the past 90 days, denies any history of treated diabetes, has no relevant family history of coronary artery disease (first degree relative at less than age 5) and has no history of hypercholesterolemia.   Past Medical History:  Diagnosis Date  . Anxiety   . Arthritis   . Cervical pain   . Hypertension     Patient Active Problem List   Diagnosis Date Noted  . Atypical chest pain 09/20/2020  . Obesity (BMI 30.0-34.9) 09/20/2020  . Essential hypertension 09/20/2020  . Pain in both upper extremities 06/26/2019  . Weakness 06/26/2019    Past Surgical History:  Procedure Laterality Date  . ABDOMINAL HYSTERECTOMY    . APPENDECTOMY    . BREAST SURGERY    . CHOLECYSTECTOMY    . TUBAL LIGATION       OB History    Gravida  4   Para  3   Term  3   Preterm      AB  1   Living        SAB  1   IAB      Ectopic      Multiple      Live Births              Family History  Problem Relation Age of Onset  . Other Mother        "borderline diabetic"  . Alzheimer's disease Father   . Hypertension Other  Social History   Tobacco Use  . Smoking status: Current Every Day Smoker    Packs/day: 1.00    Types: Cigarettes  . Smokeless tobacco: Never Used  Vaping Use  . Vaping Use: Never used  Substance Use Topics  . Alcohol use: Yes    Comment: occasionally  . Drug use: Yes    Types: Marijuana    Comment: occasionally    Home Medications Prior to Admission medications   Medication Sig Start Date End Date Taking? Authorizing Provider  citalopram (CELEXA) 20 MG tablet Take 20 mg by mouth daily. 01/21/18   [provider]  DULoxetine (CYMBALTA) 60 MG capsule Take 1 capsule (60 mg total) by mouth daily. 06/26/19   Marcial Pacas, MD  hydrochlorothiazide (HYDRODIURIL) 25 MG tablet Take 25 mg by mouth daily. 01/26/19   [provider]  losartan (COZAAR) 100 MG tablet Take 100 mg by mouth daily. 01/26/19   [provider]  meloxicam (MOBIC) 15 MG tablet Take 15 mg by mouth daily. 12/08/18   [provider]  valACYclovir (VALTREX) 1000 MG tablet Take 1,000 mg by mouth daily. 05/06/19   [provider]    Allergies    Cortisone and Doxycycline  Review of Systems   Review of Systems  Constitutional: Negative.   HENT: Negative.   Respiratory: Negative.   Cardiovascular: Positive for chest pain. Negative for palpitations and leg swelling.  Gastrointestinal: Negative.   Genitourinary: Negative.   Musculoskeletal: Negative.   Skin: Negative.   Neurological: Negative.   All other systems reviewed and are negative.   Physical Exam Updated Vital Signs BP 124/78 (BP Location: Right Arm)   Pulse 63   Temp 98.3 F (36.8 C) (Oral)   Resp 17   Ht 5\' 6"  (1.676 m)   Wt 90.7 kg   SpO2 100%   BMI 32.28 kg/m   Physical Exam Vitals and nursing note reviewed.  Constitutional:      General: She is not in acute distress.    Appearance: She is well-developed and well-nourished. She is not ill-appearing, toxic-appearing or diaphoretic.  HENT:     Head: Normocephalic and atraumatic.  Eyes:     Pupils: Pupils are equal, round, and reactive to light.  Cardiovascular:     Rate and Rhythm: Normal rate.     Pulses: Normal pulses and intact distal pulses.          Radial pulses are 2+ on the right side and 2+ on the left side.       Dorsalis pedis pulses are 2+ on the right side and 2+ on the left side.     Heart sounds: Normal heart sounds.  Pulmonary:     Effort: Pulmonary effort is normal. No respiratory distress.     Breath sounds: Normal breath sounds.  Chest:     Chest wall: No mass, deformity, tenderness, crepitus or edema. There is no dullness to percussion.       Comments: Able to reproduce pain on palpation. Abdominal:     General: Bowel sounds are normal. There is no distension.     Palpations: Abdomen is soft.  Musculoskeletal:        General: Normal range of  motion.     Cervical back: Normal range of motion.     Right lower leg: No tenderness. No edema.     Left lower leg: No tenderness. No edema.  Skin:    General: Skin is warm and dry.  Capillary Refill: Capillary refill takes less than 2 seconds.  Neurological:     General: No focal deficit present.     Mental Status: She is alert and oriented to person, place, and time.  Psychiatric:        Mood and Affect: Mood and affect normal.    ED Results / Procedures / Treatments   Labs (all labs ordered are listed, but only abnormal results are displayed) Labs Reviewed  CBC WITH DIFFERENTIAL/PLATELET - Abnormal; Notable for the following components:      Result Value   Hemoglobin 15.4 (*)    All other components within normal limits  TROPONIN I (HIGH SENSITIVITY) - Abnormal; Notable for the following components:   Troponin I (High Sensitivity) 86 (*)    All other components within normal limits  RESP PANEL BY RT-PCR (FLU A&B, COVID) ARPGX2  BASIC METABOLIC PANEL  TROPONIN I (HIGH SENSITIVITY)    EKG EKG Interpretation  Date/Time:  Monday September 20 2020 16:45:57 EST Ventricular Rate:  74 PR Interval:    QRS Duration: 99 QT Interval:  392 QTC Calculation: 435 R Axis:   82 Text Interpretation: Sinus rhythm Low voltage, precordial leads No acute changes No significant change since last tracing Confirmed by Varney Biles [phone]) on 09/20/2020 6:00:21 PM   Radiology DG Chest 2 View  Result Date: 09/20/2020 CLINICAL DATA:  Chest pressure and nausea EXAM: CHEST - 2 VIEW COMPARISON:  02/26/2020 FINDINGS: Midline trachea.  Normal heart size and mediastinal contours. Sharp costophrenic angles.  No pneumothorax.  Clear lungs. Numerous leads and wires project over the chest. IMPRESSION: No active cardiopulmonary disease. Electronically Signed   By: Abigail Miyamoto M.D.   On: 09/20/2020 17:39    Procedures .Critical Care Performed by: Nettie Elm, PA-C Authorized by: Nettie Elm, PA-C   Critical care provider statement:    Critical care time (minutes):  45   Critical care was necessary to treat or prevent imminent or life-threatening deterioration of the following conditions:  Cardiac failure   Critical care was time spent personally by me on the following activities:  Discussions with consultants, evaluation of patient's response to treatment, examination of patient, ordering and performing treatments and interventions, ordering and review of laboratory studies, ordering and review of radiographic studies, pulse oximetry, re-evaluation of patient's condition, obtaining history from patient or surrogate and review of old charts   (including critical care time)  Medications Ordered in ED Medications  nitroGLYCERIN 50 mg in dextrose 5 % 250 mL (0.2 mg/mL) infusion (has no administration in time range)  morphine 4 MG/ML injection 4 mg (4 mg Intravenous Given 09/20/20 1639)  ondansetron (ZOFRAN) injection 4 mg (4 mg Intravenous Given 09/20/20 1638)  alum & mag hydroxide-simeth (MAALOX/MYLANTA) 200-200-20 MG/5ML suspension 30 mL (30 mLs Oral Given 09/20/20 1639)    And  lidocaine (XYLOCAINE) 2 % viscous mouth solution 15 mL (15 mLs Oral Given 09/20/20 1639)  nitroGLYCERIN (NITROSTAT) SL tablet 0.4 mg (0.4 mg Sublingual Given 09/20/20 2046)   ED Course  I have reviewed the triage vital signs and the nursing notes.  Pertinent labs & imaging results that were available during my care of the patient were reviewed by me and considered in my medical decision making (see chart for details).  50 year old presents for evaluation of chest pain.  Nonexertional, nonpleuritic in nature.  No history of PE or DVT.  Does not radiate into left jaw or back.  No associated diaphoresis, paresthesias, emesis.  Does have  history of hypertension however no diabetes, hyperlipidemia, family history of early MI.  Not followed by cardiology.  Plan on labs, imaging and reassess.  Labs ridging  personally reviewed and interpreted:  CBC without leukocytosis BMP without electrolyte, renal or liver abnormality Trop 7<<< 86 EKG without ischemic changes DG chest without cardiomegaly, pulmonary edema, pneumothorax, infiltrates  Patient reassessed. No pain.   Heart score 4  Patient reassessed. Delta trop 86. Says she has "mild" pain which she rates a 3-4/10. Given significant elevation in delta troponin will admit to hospitalist service for further evaluation. Sx do no seem consistent with PE, dissection.  CONSULT with Cardiology Dr. Clois Dupes Cardiology Fellow at [facility] who recommends heparin, nitro drip, patient may stay at [facility] and cardiology can see in the morning unless patient decompensates or concern for STEMI.  CONSULT with Hospitalist Dr. Josephine Cables who agrees to evaluate patient for admission. Updated on plan discussed with Cardiology.  Patient to be admitted for significantly elevated troponins and several of chest pain.  Started on heparin, nitro drip.  Will be admitted for further  The patient appears reasonably stabilized for admission considering the current resources, flow, and capabilities available in the ED at this time, and I doubt any other [facility] requiring further screening and/or treatment in the ED prior to admission.     MDM Rules/Calculators/A&P HEAR Score: 3                        Dominique Hinton was evaluated in Emergency Department on 09/20/2020 for the symptoms described in the history of present illness. She was evaluated in the context of the global COVID-19 pandemic, which necessitated consideration that the patient might be at risk for infection with the SARS-CoV-2 virus that causes COVID-19. Institutional protocols and algorithms that pertain to the evaluation of patients at risk for COVID-19 are in a state of rapid change based on information released by regulatory bodies including the CDC and federal and state organizations. These policies and  algorithms were followed during the patient's care in the ED. Final Clinical Impression(s) / ED Diagnoses Final diagnoses:  NSTEMI (non-ST elevated myocardial infarction) [facility])    Rx / DC Orders ED Discharge Orders         Ordered    Ambulatory referral to Cardiology        09/20/20 [address] Thaddaeus Granja A, PA-C 09/20/20 2108    Varney Biles, MD 09/21/20 1611

## 2020-09-20 NOTE — Progress Notes (Signed)
ANTICOAGULATION CONSULT NOTE - Initial Consult  Pharmacy Consult for Heparin Indication: chest pain/ACS  Allergies  Allergen Reactions  . Cortisone Shortness Of Breath and Other (See Comments)    Made me feel loopy  . Doxycycline Rash    Patient Measurements: Height: 5\' 6"  (167.6 cm) Weight: 90.7 kg (200 lb) IBW/kg (Calculated) : 59.3 Heparin Dosing Weight: 79.1 kg   Vital Signs: Temp: 98.3 F (36.8 C) (01/17 2000) Temp Source: Oral (01/17 2000) BP: 124/78 (01/17 2000) Pulse Rate: 63 (01/17 2000)  Labs: Recent Labs    09/20/20 1632 09/20/20 1850  HGB 15.4*  --   HCT 42.9  --   PLT 271  --   CREATININE 0.82  --   TROPONINIHS 7 86*    Estimated Creatinine Clearance: 94.2 mL/min (by C-G formula based on SCr of 0.82 mg/dL).   Medical History: Past Medical History:  Diagnosis Date  . Anxiety   . Arthritis   . Cervical pain   . Hypertension     Assessment: 50 yo female presented on 09/20/2020 with chest pressure for 2 hours. Pharmacy consulted to dose heparin for ACS. No anticoagulation prior to admission. Troponin 86. Hgb 15.4. Plt wnl. No bleeding noted.   Goal of Therapy:  Heparin level 0.3-0.7 units/ml Monitor platelets by anticoagulation protocol: Yes   Plan:  Heparin 4000 units x1 bolus  Start heparin 950 units/hr HL and CBC at 0400 Monitor heparin level, CBC, and S/S of bleeding daily   Henri Medal 09/20/2020,9:05 PM

## 2020-09-20 NOTE — ED Triage Notes (Signed)
Pt brought to ED via [facility] for chest pressure x 2 hours moving up under left breast, and nausea. Nitro given x 1, ASA 324 mg PTA.

## 2020-09-21 ENCOUNTER — Observation Stay (HOSPITAL_BASED_OUTPATIENT_CLINIC_OR_DEPARTMENT_OTHER): Payer: Medicaid Other

## 2020-09-21 ENCOUNTER — Encounter (HOSPITAL_COMMUNITY): Payer: Self-pay | Admitting: Internal Medicine

## 2020-09-21 ENCOUNTER — Encounter (HOSPITAL_COMMUNITY)
Admission: EM | Disposition: A | Discharge status: Home / Self Care | Payer: Self-pay | Source: Home / Self Care | Attending: Interventional Cardiology

## 2020-09-21 DIAGNOSIS — I2510 Atherosclerotic heart disease of native coronary artery without angina pectoris: Secondary | ICD-10-CM

## 2020-09-21 DIAGNOSIS — Z90710 Acquired absence of both cervix and uterus: Secondary | ICD-10-CM | POA: Diagnosis not present

## 2020-09-21 DIAGNOSIS — Z820 Family history of epilepsy and other diseases of the nervous system: Secondary | ICD-10-CM | POA: Diagnosis not present

## 2020-09-21 DIAGNOSIS — Z20822 Contact with and (suspected) exposure to covid-19: Secondary | ICD-10-CM | POA: Diagnosis not present

## 2020-09-21 DIAGNOSIS — I214 Non-ST elevation (NSTEMI) myocardial infarction: Secondary | ICD-10-CM

## 2020-09-21 DIAGNOSIS — E785 Hyperlipidemia, unspecified: Secondary | ICD-10-CM | POA: Diagnosis not present

## 2020-09-21 DIAGNOSIS — G8929 Other chronic pain: Secondary | ICD-10-CM | POA: Diagnosis not present

## 2020-09-21 DIAGNOSIS — Z6832 Body mass index (BMI) 32.0-32.9, adult: Secondary | ICD-10-CM | POA: Diagnosis not present

## 2020-09-21 DIAGNOSIS — R110 Nausea: Secondary | ICD-10-CM | POA: Diagnosis not present

## 2020-09-21 DIAGNOSIS — Z720 Tobacco use: Secondary | ICD-10-CM | POA: Diagnosis not present

## 2020-09-21 DIAGNOSIS — Z888 Allergy status to other drugs, medicaments and biological substances status: Secondary | ICD-10-CM | POA: Diagnosis not present

## 2020-09-21 DIAGNOSIS — I200 Unstable angina: Secondary | ICD-10-CM | POA: Diagnosis not present

## 2020-09-21 DIAGNOSIS — M549 Dorsalgia, unspecified: Secondary | ICD-10-CM | POA: Diagnosis not present

## 2020-09-21 DIAGNOSIS — Z791 Long term (current) use of non-steroidal anti-inflammatories (NSAID): Secondary | ICD-10-CM | POA: Diagnosis not present

## 2020-09-21 DIAGNOSIS — E669 Obesity, unspecified: Secondary | ICD-10-CM | POA: Diagnosis not present

## 2020-09-21 DIAGNOSIS — R778 Other specified abnormalities of plasma proteins: Secondary | ICD-10-CM | POA: Diagnosis not present

## 2020-09-21 DIAGNOSIS — Z8249 Family history of ischemic heart disease and other diseases of the circulatory system: Secondary | ICD-10-CM | POA: Diagnosis not present

## 2020-09-21 DIAGNOSIS — F419 Anxiety disorder, unspecified: Secondary | ICD-10-CM | POA: Diagnosis not present

## 2020-09-21 DIAGNOSIS — Z881 Allergy status to other antibiotic agents status: Secondary | ICD-10-CM | POA: Diagnosis not present

## 2020-09-21 DIAGNOSIS — I10 Essential (primary) hypertension: Secondary | ICD-10-CM | POA: Diagnosis not present

## 2020-09-21 DIAGNOSIS — Z79899 Other long term (current) drug therapy: Secondary | ICD-10-CM | POA: Diagnosis not present

## 2020-09-21 DIAGNOSIS — R0789 Other chest pain: Secondary | ICD-10-CM | POA: Diagnosis not present

## 2020-09-21 DIAGNOSIS — F17210 Nicotine dependence, cigarettes, uncomplicated: Secondary | ICD-10-CM | POA: Diagnosis not present

## 2020-09-21 HISTORY — PX: CORONARY BALLOON ANGIOPLASTY: CATH118233

## 2020-09-21 HISTORY — PX: CORONARY/GRAFT ACUTE MI REVASCULARIZATION: CATH118305

## 2020-09-21 HISTORY — PX: CARDIAC CATHETERIZATION: SHX172

## 2020-09-21 HISTORY — PX: LEFT HEART CATH AND CORONARY ANGIOGRAPHY: CATH118249

## 2020-09-21 LAB — PHOSPHORUS: Phosphorus: 2.3 mg/dL — ABNORMAL LOW (ref 2.5–4.6)

## 2020-09-21 LAB — COMPREHENSIVE METABOLIC PANEL
ALT: 54 U/L — ABNORMAL HIGH (ref 0–44)
AST: 50 U/L — ABNORMAL HIGH (ref 15–41)
Albumin: 3.6 g/dL (ref 3.5–5.0)
Alkaline Phosphatase: 53 U/L (ref 38–126)
Anion gap: 7 (ref 5–15)
BUN: 8 mg/dL (ref 6–20)
CO2: 22 mmol/L (ref 22–32)
Calcium: 8.7 mg/dL — ABNORMAL LOW (ref 8.9–10.3)
Chloride: 107 mmol/L (ref 98–111)
Creatinine, Ser: 0.81 mg/dL (ref 0.44–1.00)
GFR, Estimated: >60 mL/min (ref >60)
Glucose, Bld: 141 mg/dL — ABNORMAL HIGH (ref 70–99)
Potassium: 3.5 mmol/L (ref 3.5–5.1)
Sodium: 136 mmol/L (ref 135–145)
Total Bilirubin: 0.9 mg/dL (ref 0.3–1.2)
Total Protein: 6.6 g/dL (ref 6.5–8.1)

## 2020-09-21 LAB — TROPONIN I (HIGH SENSITIVITY)
Troponin I (High Sensitivity): 1318 ng/L (ref <18)
Troponin I (High Sensitivity): 1690 ng/L (ref <18)
Troponin I (High Sensitivity): 2617 ng/L (ref <18)
Troponin I (High Sensitivity): 2695 ng/L (ref <18)
Troponin I (High Sensitivity): 2379 ng/L (ref <18)

## 2020-09-21 LAB — CBC
HCT: 44.8 % (ref 36.0–46.0)
HCT: 45.6 % (ref 36.0–46.0)
Hemoglobin: 15.8 g/dL — ABNORMAL HIGH (ref 12.0–15.0)
Hemoglobin: 16 g/dL — ABNORMAL HIGH (ref 12.0–15.0)
MCH: 31.5 pg (ref 26.0–34.0)
MCH: 32 pg (ref 26.0–34.0)
MCHC: 35.1 g/dL (ref 30.0–36.0)
MCHC: 35.3 g/dL (ref 30.0–36.0)
MCV: 89.8 fL (ref 80.0–100.0)
MCV: 90.7 fL (ref 80.0–100.0)
Platelets: 284 10*3/uL (ref 150–400)
Platelets: 286 10*3/uL (ref 150–400)
RBC: 4.94 MIL/uL (ref 3.87–5.11)
RBC: 5.08 MIL/uL (ref 3.87–5.11)
RDW: 12.5 % (ref 11.5–15.5)
RDW: 12.7 % (ref 11.5–15.5)
WBC: 7.2 10*3/uL (ref 4.0–10.5)
WBC: 7.5 10*3/uL (ref 4.0–10.5)
nRBC: 0 % (ref 0.0–0.2)
nRBC: 0 % (ref 0.0–0.2)

## 2020-09-21 LAB — HIV ANTIBODY (ROUTINE TESTING W REFLEX): HIV Screen 4th Generation wRfx: NONREACTIVE

## 2020-09-21 LAB — PROTIME-INR
INR: 1 (ref 0.8–1.2)
Prothrombin Time: 13.2 seconds (ref 11.4–15.2)

## 2020-09-21 LAB — ECHOCARDIOGRAM COMPLETE
Area-P 1/2: 4.6 cm2
Height: 66 in
S' Lateral: 3.2 cm
Weight: 3200 oz

## 2020-09-21 LAB — MAGNESIUM: Magnesium: 2 mg/dL (ref 1.7–2.4)

## 2020-09-21 LAB — HEMOGLOBIN A1C
Hgb A1c MFr Bld: 4.9 % (ref 4.8–5.6)
Mean Plasma Glucose: 93.93 mg/dL

## 2020-09-21 LAB — HEPARIN LEVEL (UNFRACTIONATED)
Heparin Unfractionated: <0.1 IU/mL — ABNORMAL LOW (ref 0.30–0.70)
Heparin Unfractionated: 0.22 IU/mL — ABNORMAL LOW (ref 0.30–0.70)

## 2020-09-21 LAB — MRSA PCR SCREENING: MRSA by PCR: NEGATIVE

## 2020-09-21 LAB — APTT: aPTT: 40 seconds — ABNORMAL HIGH (ref 24–36)

## 2020-09-21 LAB — POCT ACTIVATED CLOTTING TIME: Activated Clotting Time: 345 seconds

## 2020-09-21 SURGERY — LEFT HEART CATH AND CORONARY ANGIOGRAPHY
Anesthesia: LOCAL

## 2020-09-21 MED ORDER — VERAPAMIL HCL 2.5 MG/ML IV SOLN
INTRAVENOUS | Status: DC | PRN
  Administered 2020-09-21: 10 mL via INTRA_ARTERIAL

## 2020-09-21 MED ORDER — ACETAMINOPHEN 325 MG PO TABS
650.0000 mg | ORAL_TABLET | ORAL | Status: DC | PRN
  Administered 2020-09-21: 650 mg via ORAL
  Filled 2020-09-21: qty 2

## 2020-09-21 MED ORDER — VERAPAMIL HCL 2.5 MG/ML IV SOLN
INTRAVENOUS | Status: AC
  Filled 2020-09-21: qty 2

## 2020-09-21 MED ORDER — ROSUVASTATIN CALCIUM 20 MG PO TABS
20.0000 mg | ORAL_TABLET | Freq: Every day | ORAL | Status: DC
  Administered 2020-09-21: 20 mg via ORAL
  Filled 2020-09-21: qty 1

## 2020-09-21 MED ORDER — SODIUM CHLORIDE 0.9 % IV SOLN: INTRAVENOUS | Status: AC

## 2020-09-21 MED ORDER — LIDOCAINE HCL (PF) 1 % IJ SOLN
INTRAMUSCULAR | Status: AC
  Filled 2020-09-21: qty 30

## 2020-09-21 MED ORDER — TICAGRELOR 90 MG PO TABS
ORAL_TABLET | ORAL | Status: AC
  Filled 2020-09-21: qty 2

## 2020-09-21 MED ORDER — HYDRALAZINE HCL 20 MG/ML IJ SOLN: 10.0000 mg | INTRAMUSCULAR | Status: AC | PRN

## 2020-09-21 MED ORDER — LABETALOL HCL 5 MG/ML IV SOLN: 10.0000 mg | INTRAVENOUS | Status: AC | PRN

## 2020-09-21 MED ORDER — CHLORHEXIDINE GLUCONATE CLOTH 2 % EX PADS
6.0000 | MEDICATED_PAD | Freq: Every day | CUTANEOUS | Status: DC
  Administered 2020-09-21: 6 via TOPICAL

## 2020-09-21 MED ORDER — TICAGRELOR 90 MG PO TABS
90.0000 mg | ORAL_TABLET | Freq: Two times a day (BID) | ORAL | Status: DC
  Administered 2020-09-21 – 2020-09-22 (×2): 90 mg via ORAL
  Filled 2020-09-21 (×2): qty 1

## 2020-09-21 MED ORDER — TICAGRELOR 90 MG PO TABS
ORAL_TABLET | ORAL | Status: DC | PRN
  Administered 2020-09-21: 180 mg via ORAL

## 2020-09-21 MED ORDER — SODIUM CHLORIDE 0.9% FLUSH: 3.0000 mL | Freq: Two times a day (BID) | INTRAVENOUS | Status: DC

## 2020-09-21 MED ORDER — SODIUM CHLORIDE 0.9 % IV SOLN: 250.0000 mL | INTRAVENOUS | Status: DC | PRN

## 2020-09-21 MED ORDER — ACETAMINOPHEN 325 MG PO TABS: 650.0000 mg | ORAL_TABLET | Freq: Four times a day (QID) | ORAL | Status: DC | PRN

## 2020-09-21 MED ORDER — DULOXETINE HCL 60 MG PO CPEP
60.0000 mg | ORAL_CAPSULE | Freq: Every day | ORAL | Status: DC
  Administered 2020-09-21 – 2020-09-22 (×2): 60 mg via ORAL
  Filled 2020-09-21 (×2): qty 1

## 2020-09-21 MED ORDER — HEPARIN (PORCINE) IN NACL 1000-0.9 UT/500ML-% IV SOLN
INTRAVENOUS | Status: DC | PRN
  Administered 2020-09-21 (×2): 500 mL

## 2020-09-21 MED ORDER — HEPARIN (PORCINE) IN NACL 1000-0.9 UT/500ML-% IV SOLN
INTRAVENOUS | Status: AC
  Filled 2020-09-21: qty 1000

## 2020-09-21 MED ORDER — SODIUM CHLORIDE 0.9 % WEIGHT BASED INFUSION
3.0000 mL/kg/h | INTRAVENOUS | Status: DC
  Administered 2020-09-21: 3 mL/kg/h via INTRAVENOUS

## 2020-09-21 MED ORDER — ASPIRIN 81 MG PO CHEW
81.0000 mg | CHEWABLE_TABLET | Freq: Every day | ORAL | Status: DC
  Administered 2020-09-22: 81 mg via ORAL
  Filled 2020-09-21: qty 1

## 2020-09-21 MED ORDER — NITROGLYCERIN 1 MG/10 ML FOR IR/CATH LAB
INTRA_ARTERIAL | Status: AC
  Filled 2020-09-21: qty 10

## 2020-09-21 MED ORDER — FENTANYL CITRATE (PF) 100 MCG/2ML IJ SOLN
INTRAMUSCULAR | Status: DC | PRN
  Administered 2020-09-21: 50 ug via INTRAVENOUS
  Administered 2020-09-21 (×2): 25 ug via INTRAVENOUS

## 2020-09-21 MED ORDER — MIDAZOLAM HCL 2 MG/2ML IJ SOLN
INTRAMUSCULAR | Status: AC
  Filled 2020-09-21: qty 2

## 2020-09-21 MED ORDER — ASPIRIN EC 81 MG PO TBEC
81.0000 mg | DELAYED_RELEASE_TABLET | Freq: Every day | ORAL | Status: DC
  Administered 2020-09-21: 81 mg via ORAL
  Filled 2020-09-21: qty 1

## 2020-09-21 MED ORDER — ONDANSETRON HCL 4 MG/2ML IJ SOLN
4.0000 mg | Freq: Four times a day (QID) | INTRAMUSCULAR | Status: DC | PRN
  Administered 2020-09-21: 4 mg via INTRAVENOUS
  Filled 2020-09-21: qty 2

## 2020-09-21 MED ORDER — SODIUM CHLORIDE 0.9% FLUSH: 3.0000 mL | INTRAVENOUS | Status: DC | PRN

## 2020-09-21 MED ORDER — ONDANSETRON HCL 4 MG/2ML IJ SOLN: 4.0000 mg | Freq: Four times a day (QID) | INTRAMUSCULAR | Status: DC | PRN

## 2020-09-21 MED ORDER — FENTANYL CITRATE (PF) 100 MCG/2ML IJ SOLN
INTRAMUSCULAR | Status: AC
  Filled 2020-09-21: qty 2

## 2020-09-21 MED ORDER — IOHEXOL 350 MG/ML SOLN
INTRAVENOUS | Status: DC | PRN
  Administered 2020-09-21: 80 mL via INTRA_ARTERIAL

## 2020-09-21 MED ORDER — MIDAZOLAM HCL 2 MG/2ML IJ SOLN
INTRAMUSCULAR | Status: DC | PRN
  Administered 2020-09-21: 1 mg via INTRAVENOUS
  Administered 2020-09-21: 2 mg via INTRAVENOUS

## 2020-09-21 MED ORDER — SODIUM CHLORIDE 0.9 % WEIGHT BASED INFUSION
1.0000 mL/kg/h | INTRAVENOUS | Status: DC
  Administered 2020-09-21: 1 mL/kg/h via INTRAVENOUS

## 2020-09-21 MED ORDER — HEPARIN BOLUS VIA INFUSION
1000.0000 [IU] | Freq: Once | INTRAVENOUS | Status: DC
  Filled 2020-09-21: qty 1000

## 2020-09-21 MED ORDER — HEPARIN SODIUM (PORCINE) 1000 UNIT/ML IJ SOLN
INTRAMUSCULAR | Status: AC
  Filled 2020-09-21: qty 1

## 2020-09-21 MED ORDER — CITALOPRAM HYDROBROMIDE 20 MG PO TABS
20.0000 mg | ORAL_TABLET | Freq: Every day | ORAL | Status: DC
  Administered 2020-09-21 – 2020-09-22 (×2): 20 mg via ORAL
  Filled 2020-09-21 (×2): qty 1

## 2020-09-21 MED ORDER — SODIUM CHLORIDE 0.9% FLUSH
3.0000 mL | Freq: Two times a day (BID) | INTRAVENOUS | Status: DC
  Administered 2020-09-22: 3 mL via INTRAVENOUS

## 2020-09-21 MED ORDER — LIDOCAINE HCL (PF) 1 % IJ SOLN
INTRAMUSCULAR | Status: DC | PRN
  Administered 2020-09-21: 2 mL

## 2020-09-21 MED ORDER — HEPARIN SODIUM (PORCINE) 1000 UNIT/ML IJ SOLN
INTRAMUSCULAR | Status: DC | PRN
  Administered 2020-09-21: 5500 [IU] via INTRAVENOUS
  Administered 2020-09-21: 4500 [IU] via INTRAVENOUS

## 2020-09-21 MED ORDER — HEPARIN BOLUS VIA INFUSION
2000.0000 [IU] | Freq: Once | INTRAVENOUS | Status: AC
  Administered 2020-09-21: 2000 [IU] via INTRAVENOUS
  Filled 2020-09-21: qty 2000

## 2020-09-21 SURGICAL SUPPLY — 20 items
BAG SNAP BAND KOVER 36X36 (MISCELLANEOUS) ×2 IMPLANT
BALLN SAPPHIRE 1.25X10 (BALLOONS) ×2
BALLN SAPPHIRE 2.0X15 (BALLOONS) ×2
BALLOON SAPPHIRE 1.25X10 (BALLOONS) ×1 IMPLANT
BALLOON SAPPHIRE 2.0X15 (BALLOONS) ×1 IMPLANT
CATH 5FR JL3.5 JR4 ANG PIG MP (CATHETERS) ×2 IMPLANT
CATH LAUNCHER 6FR EBU3.5 (CATHETERS) ×2 IMPLANT
COVER DOME SNAP 22 D (MISCELLANEOUS) ×2 IMPLANT
DEVICE RAD COMP TR BAND LRG (VASCULAR PRODUCTS) ×2 IMPLANT
GLIDESHEATH SLEND SS 6F .021 (SHEATH) ×2 IMPLANT
GUIDEWIRE INQWIRE 1.5J.035X260 (WIRE) ×1 IMPLANT
INQWIRE 1.5J .035X260CM (WIRE) ×2
KIT ENCORE 26 ADVANTAGE (KITS) ×2 IMPLANT
KIT HEART LEFT (KITS) ×2 IMPLANT
KIT HEMO VALVE WATCHDOG (MISCELLANEOUS) ×2 IMPLANT
PACK CARDIAC CATHETERIZATION (CUSTOM PROCEDURE TRAY) ×2 IMPLANT
SHEATH PROBE COVER 6X72 (BAG) ×2 IMPLANT
TRANSDUCER W/STOPCOCK (MISCELLANEOUS) ×2 IMPLANT
TUBING CIL FLEX 10 FLL-RA (TUBING) ×2 IMPLANT
WIRE ASAHI PROWATER 180CM (WIRE) ×2 IMPLANT

## 2020-09-21 NOTE — Interval H&P Note (Signed)
Cath Lab Visit (complete for each Cath Lab visit)  Clinical Evaluation Leading to the Procedure:   ACS: Yes.    Non-ACS:    Anginal Classification: CCS IV  Anti-ischemic medical therapy: Minimal Therapy (1 class of medications)  Non-Invasive Test Results: No non-invasive testing performed  Prior CABG: No previous CABG      History and Physical Interval Note:  09/21/2020 2:57 PM  Dominique Hinton  has presented today for surgery, with the diagnosis of nstemi.  The various methods of treatment have been discussed with the patient and family. After consideration of risks, benefits and other options for treatment, the patient has consented to  Procedure(s): LEFT HEART CATH AND CORONARY ANGIOGRAPHY (N/A) as a surgical intervention.  The patient's history has been reviewed, patient examined, no change in status, stable for surgery.  I have reviewed the patient's chart and labs.  Questions were answered to the patient's satisfaction.     Larae Grooms

## 2020-09-21 NOTE — Progress Notes (Signed)
  Informed by Cardiology service this am that they are taking over care of pt - Per cardiology team pt will be transferred to [facility] today for [facility] and she will stay on cardiology service  - Pt was not seen by me today  -Further care per Cardiology service---- -Please call Hospitalist team if the need arises  -Thank U  Roxan Hockey, MD

## 2020-09-21 NOTE — Plan of Care (Signed)
  Problem: Education: Goal: Knowledge of General Education information will improve Description: Including pain rating scale, medication(s)/side effects and non-pharmacologic comfort measures 09/21/2020 2000 by Shanon Ace, RN Outcome: Progressing 09/21/2020 2000 by Shanon Ace, RN Outcome: Progressing   Problem: Health Behavior/Discharge Planning: Goal: Ability to manage health-related needs will improve 09/21/2020 2000 by Shanon Ace, RN Outcome: Progressing 09/21/2020 2000 by Shanon Ace, RN Outcome: Progressing   Problem: Clinical Measurements: Goal: Ability to maintain clinical measurements within normal limits will improve 09/21/2020 2000 by Shanon Ace, RN Outcome: Progressing 09/21/2020 2000 by Shanon Ace, RN Outcome: Progressing Goal: Will remain free from infection 09/21/2020 2000 by Shanon Ace, RN Outcome: Progressing 09/21/2020 2000 by Shanon Ace, RN Outcome: Progressing Goal: Diagnostic test results will improve 09/21/2020 2000 by Shanon Ace, RN Outcome: Progressing 09/21/2020 2000 by Shanon Ace, RN Outcome: Progressing Goal: Respiratory complications will improve 09/21/2020 2000 by Shanon Ace, RN Outcome: Progressing 09/21/2020 2000 by Shanon Ace, RN Outcome: Progressing Goal: Cardiovascular complication will be avoided 09/21/2020 2000 by Shanon Ace, RN Outcome: Progressing 09/21/2020 2000 by Shanon Ace, RN Outcome: Progressing   Problem: Activity: Goal: Risk for activity intolerance will decrease 09/21/2020 2000 by Shanon Ace, RN Outcome: Progressing 09/21/2020 2000 by Shanon Ace, RN Outcome: Progressing   Problem: Nutrition: Goal: Adequate nutrition will be maintained 09/21/2020 2000 by Shanon Ace, RN Outcome: Progressing 09/21/2020 2000 by Shanon Ace, RN Outcome: Progressing   Problem: Coping: Goal: Level of anxiety will decrease 09/21/2020 2000 by Shanon Ace, RN Outcome: Progressing 09/21/2020 2000 by Shanon Ace, RN Outcome: Progressing   Problem: Elimination: Goal: Will not experience complications related to bowel motility 09/21/2020 2000 by Shanon Ace, RN Outcome: Progressing 09/21/2020 2000 by Shanon Ace, RN Outcome: Progressing Goal: Will not experience complications related to urinary retention 09/21/2020 2000 by Shanon Ace, RN Outcome: Progressing 09/21/2020 2000 by Shanon Ace, RN Outcome: Progressing   Problem: Pain Managment: Goal: General experience of comfort will improve 09/21/2020 2000 by Shanon Ace, RN Outcome: Progressing 09/21/2020 2000 by Shanon Ace, RN Outcome: Progressing   Problem: Safety: Goal: Ability to remain free from injury will improve 09/21/2020 2000 by Shanon Ace, RN Outcome: Progressing 09/21/2020 2000 by Shanon Ace, RN Outcome: Progressing   Problem: Skin Integrity: Goal: Risk for impaired skin integrity will decrease 09/21/2020 2000 by Shanon Ace, RN Outcome: Progressing 09/21/2020 2000 by Shanon Ace, RN Outcome: Progressing   Problem: Education: Goal: Understanding of CV disease, CV risk reduction, and recovery process will improve 09/21/2020 2000 by Shanon Ace, RN Outcome: Progressing 09/21/2020 2000 by Shanon Ace, RN Outcome: Progressing Goal: Individualized Educational Video(s) 09/21/2020 2000 by Shanon Ace, RN Outcome: Progressing 09/21/2020 2000 by Shanon Ace, RN Outcome: Progressing   Problem: Activity: Goal: Ability to return to baseline activity level will improve 09/21/2020 2000 by Shanon Ace, RN Outcome: Progressing 09/21/2020 2000 by Shanon Ace, RN Outcome: Progressing   Problem: Cardiovascular: Goal: Ability to achieve and maintain adequate cardiovascular perfusion will improve 09/21/2020 2000 by Shanon Ace, RN Outcome: Progressing 09/21/2020 2000 by Shanon Ace, RN Outcome: Progressing Goal: Vascular access site(s) Level 0-1 will be maintained 09/21/2020 2000 by Shanon Ace,  RN Outcome: Progressing 09/21/2020 2000 by Shanon Ace, RN Outcome: Progressing   Problem: Health Behavior/Discharge Planning: Goal: Ability to safely manage health-related needs after discharge will improve 09/21/2020 2000 by Shanon Ace, RN Outcome: Progressing 09/21/2020 2000 by Shanon Ace, RN Outcome: Progressing

## 2020-09-21 NOTE — ED Notes (Signed)
Date and time results received: 09/21/20 0134   Test: troponin Critical Value: 1,318  Name of Provider Notified: Dr. Cloyd Stagers  Orders Received? Or Actions Taken?: pt denies chest pain at this time, and per triage she received 324 aspirin prior to arrival to ED, Dr aware. Dr reports more troponin labs trending, pt may need cardiac cath.

## 2020-09-21 NOTE — H&P (View-Only) (Signed)
Cardiology Consult    Patient ID: PATRIECE DANESE; [id number]; July 24, 1971   Admit date: 09/20/2020 Date of Consult: 09/21/2020  Primary Care Provider: Asencion Noble, MD Primary Cardiologist: New to [facility] - Dr. Domenic Polite  Patient Profile    BRYAR WESTWOOD is a 50 y.o. female with past medical history of HTN and tobacco use who is being seen today for the evaluation of an NSTEMI at the request of Dr. Josephine Cables.   History of Present Illness    Ms. Malstrom presented to [facility] ED on 09/20/2020 for evaluation of chest pressure. She reports being in her usual state of health until yesterday afternoon when she developed a discomfort/pressure which radiated from her sternum and underneath her left breast into her neck and down her left arm. She walked around the house and symptoms did not improve. Says she felt like she needed to belch. Reports associated nausea but no vomiting or diaphoresis. She is not active at baseline secondary to back pain and says she has been participating in PT with improvement in her symptoms. No recent chest pain or dyspnea on exertion prior to yesterday. No known history of CAD, CHF or cardiac arrhythmias. No known family history of cardiac issues. She does have known HTN. Not aware of a diagnosis or HLD or Type 2 DM. She does work at [employer].   Initial labs show WBC 7.7, Hgb 15.4, platelets 271, +136, K+ 3.5 and creatinine 0.82. COVID negative. Initial HS Troponin 86 with repeat values of 825, 1318, 1690 and 2617. CXR with no active cardiopulmonary disease. EKG shows NSR, HR 74 with no distinct ST abnormalities but significant baseline artifact. Repeat tracing this AM shows isolated TWI along AVL.   She has been started on Heparin along with ASA 81mg  daily. Was initially on IV NTG but developed a significant headache with this along with nausea and vomiting, therefore this was discontinued. Denies any pain at this time.    Past Medical History:  Diagnosis Date   . Anxiety   . Arthritis   . Cervical pain   . Hypertension     Past Surgical History:  Procedure Laterality Date  . ABDOMINAL HYSTERECTOMY    . APPENDECTOMY    . BREAST SURGERY    . CHOLECYSTECTOMY    . TUBAL LIGATION       Home Medications:  Prior to Admission medications   Medication Sig Start Date End Date Taking? Authorizing Provider  citalopram (CELEXA) 20 MG tablet Take 20 mg by mouth daily. 01/21/18   [provider]  DULoxetine (CYMBALTA) 60 MG capsule Take 1 capsule (60 mg total) by mouth daily. 06/26/19   Marcial Pacas, MD  hydrochlorothiazide (HYDRODIURIL) 25 MG tablet Take 25 mg by mouth daily. 01/26/19   [provider]  losartan (COZAAR) 100 MG tablet Take 100 mg by mouth daily. 01/26/19   [provider]  meloxicam (MOBIC) 15 MG tablet Take 15 mg by mouth daily. 12/08/18   [provider]  valACYclovir (VALTREX) 1000 MG tablet Take 1,000 mg by mouth daily. 05/06/19   [provider]    Inpatient Medications: Scheduled Meds: . aspirin EC  81 mg Oral Daily  . Chlorhexidine Gluconate Cloth  6 each Topical Daily  . hydrochlorothiazide  25 mg Oral Daily  . losartan  100 mg Oral Daily   Continuous Infusions: . heparin 1,100 Units/hr (09/21/20 0805)   PRN Meds: acetaminophen, nitroGLYCERIN, ondansetron (ZOFRAN) IV  Allergies:    Allergies  Allergen Reactions  . Cortisone Shortness Of Breath and Other (See Comments)    Made me feel loopy  . Doxycycline Rash    Social History:   Social History   Socioeconomic History  . Marital status: Legally Separated    Spouse name: Not on file  . Number of children: 3  . Years of education: 45  . Highest education level: Not on file  Occupational History  . Occupation: waitress/cook  Tobacco Use  . Smoking status: Current Every Day Smoker    Packs/day: 1.00    Types: Cigarettes  . Smokeless tobacco: Never Used  Vaping Use  . Vaping Use: Never used  Substance and Sexual  Activity  . Alcohol use: Yes    Comment: occasionally  . Drug use: Yes    Types: Marijuana    Comment: occasionally  . Sexual activity: Yes    Birth control/protection: None  Other Topics Concern  . Not on file  Social History Narrative   Lives at home with daughter, Joslyn Hy and fiance.   10-12 cups caffeine per day.   Right-handed.   Social Determinants of Health   Financial Resource Strain: Not on file  Food Insecurity: Not on file  Transportation Needs: Not on file  Physical Activity: Not on file  Stress: Not on file  Social Connections: Not on file  Intimate Partner Violence: Not on file     Family History:    Family History  Problem Relation Age of Onset  . Other Mother        "borderline diabetic"  . Alzheimer's disease Father   . Hypertension Other       Review of Systems    General:  No chills, fever, night sweats or weight changes.  Cardiovascular:  No dyspnea on exertion, edema, orthopnea, palpitations, paroxysmal nocturnal dyspnea. Positive for chest pain.  Dermatological: No rash, lesions/masses Respiratory: No cough, dyspnea Urologic: No hematuria, dysuria Abdominal:   No nausea, vomiting, diarrhea, bright red blood per rectum, melena, or hematemesis Neurologic:  No visual changes, wkns, changes in mental status. All other systems reviewed and are otherwise negative except as noted above.  Physical Exam/Data    Vitals:   09/21/20 0500 09/21/20 0600 09/21/20 0700 09/21/20 0800  BP: (!) 147/92  126/84 (!) 145/94  Pulse: (!) 56 60 (!) 55 61  Resp: 16 17 17 16   Temp:      TempSrc:      SpO2: 98% 100% 95% 97%  Weight:      Height:        Intake/Output Summary (Last 24 hours) at 09/21/2020 0807 Last data filed at 09/21/2020 0805 Gross per 24 hour  Intake 146.77 ml  Output -  Net 146.77 ml   Filed Weights   09/20/20 1614  Weight: 90.7 kg   Body mass index is 32.28 kg/m.   General: Pleasant female appearing in NAD Psych: Normal  affect. Neuro: Alert and oriented X 3. Moves all extremities spontaneously. HEENT: Normal  Neck: Supple without bruits or JVD. Lungs:  Resp regular and unlabored, CTA without wheezing or rales. Heart: RRR no s3, s4, or murmurs. Abdomen: Soft, non-tender, non-distended, BS + x 4.  Extremities: No clubbing, cyanosis or edema. DP/PT/Radials 2+ and equal bilaterally.   EKG:  The EKG was personally reviewed and demonstrates: NSR, HR 74 with no distinct ST abnormalities but significant baseline artifact. Repeat tracing this AM shows isolated TWI along AVL.   Telemetry:  Telemetry was personally reviewed and demonstrates:  Sinus bradycardia, HR in 50's to 60's.   Labs/Studies     Relevant CV Studies:  None on File  Laboratory Data:  Chemistry Recent Labs  Lab 09/20/20 1632 09/21/20 0354  NA 136 136  K 3.5 3.5  CL 107 107  CO2 22 22  GLUCOSE 99 141*  BUN 8 8  CREATININE 0.82 0.81  CALCIUM 9.1 8.7*  GFRNONAA >60 >60  ANIONGAP 7 7    Recent Labs  Lab 09/21/20 0354  PROT 6.6  ALBUMIN 3.6  AST 50*  ALT 54*  ALKPHOS 53  BILITOT 0.9   Hematology Recent Labs  Lab 09/20/20 1632 09/21/20 0354  WBC 7.7 7.5  7.2  RBC 4.84 5.08  4.94  HGB 15.4* 16.0*  15.8*  HCT 42.9 45.6  44.8  MCV 88.6 89.8  90.7  MCH 31.8 31.5  32.0  MCHC 35.9 35.1  35.3  RDW 12.5 12.5  12.7  PLT 271 284  286   Cardiac EnzymesNo results for input(s): TROPONINI in the last 168 hours. No results for input(s): TROPIPOC in the last 168 hours.  BNPNo results for input(s): BNP, PROBNP in the last 168 hours.  DDimer No results for input(s): DDIMER in the last 168 hours.  Radiology/Studies:  DG Chest 2 View  Result Date: 09/20/2020 CLINICAL DATA:  Chest pressure and nausea EXAM: CHEST - 2 VIEW COMPARISON:  02/26/2020 FINDINGS: Midline trachea.  Normal heart size and mediastinal contours. Sharp costophrenic angles.  No pneumothorax.  Clear lungs. Numerous leads and wires project over the chest.  IMPRESSION: No active cardiopulmonary disease. Electronically Signed   By: Abigail Miyamoto M.D.   On: 09/20/2020 17:39     Assessment & Plan    1. NSTEMI - She presented with chest pressure which started yesterday afternoon and radiated into her neck and left arm with associated nausea. Found to have an NSTEMI with HS Troponin values trending up to 2617. EKG is without acute ST abnormalities. Will check FLP and Hgb A1c for risk stratification.  - Will plan for transfer to [facility] today on the Cardiology service and a cardiac catheterization this afternoon. She has been NPO since midnight. The patient understands that risks include but are not limited to stroke (1 in 1000), death (1 in 94), kidney failure [usually temporary] (1 in 500), bleeding (1 in 200), allergic reaction [possibly serious] (1 in 200).   - Continue Heparin along with ASA 81mg  daily and Losartan 100mg  daily. Will add Crestor 20mg  daily (LFT's mildly elevated with AST 50 and ALT 54 so would recheck as an outpatient). No BB given baseline HR in the 50's.   2. HTN - BP has been variable from 116/81 - 179/118 since admission, at 145/83 on most recent check. Will continue Losartan 100mg  daily. Hold PTA HCTZ in anticipation of cardiac catheterization.   3. Tobacco Use - She continues to smoke 1 ppd. Cessation advised.    For questions or updates, please contact [place] Please consult [email or web address] for contact info under Cardiology/STEMI.  Signed, Erma Heritage, PA-C 09/21/2020, 8:07 AM Pager: [phone]   Attending note:  Patient seen and examined.  I reviewed her records and discussed the case with the Delano Metz, I agree with her above findings.  Ms. Clippinger presents with recent onset chest pressure associated with nausea, no shortness of breath or palpitations.  She has a history of hypertension and tobacco abuse at baseline.  On examination this morning she appears comfortable at rest, no active  chest  discomfort and in no distress.  Heart rate in the 60s in sinus rhythm by telemetry which I personally reviewed.  Systolic blood pressure 0000000 to 150s.  Lungs are clear without labored breathing.  Cardiac exam reveals RRR without gallop.  Pertinent lab work includes potassium 3.5, creatinine 0.81, AST 50, ALT 54, peak high-sensitivity troponin I of 2695, hemoglobin 15.8, platelets 284, SARS coronavirus 2 test negative.  Chest x-ray reports no acute findings.  ECG shows possible left atrial enlargement, decreased anteroseptal R wave progression.  NSTEMI, currently pain-free and with peak high-sensitivity troponin I of 2695 at this point.  Risks and benefits of a diagnostic cardiac catheterization discussed, patient is in agreement to proceed.  Plan is transfer to [facility] on the cardiology service for cardiac catheterization today.  Keep NPO.  Continue IV heparin, also aspirin, losartan, and Crestor.  Holding off beta-blocker with heart rate at 60.  Satira Sark, M.D., F.A.C.C.

## 2020-09-21 NOTE — Progress Notes (Signed)
Results for KUUIPO, ANZALDO (MRN [id number]) as of 09/21/2020 07:21  Ref. Range 09/21/2020 05:55  Troponin I (High Sensitivity) Latest Ref Range: <18 ng/L 2,617 [facility])    Critical result called to Dr. Denton Brick. No new orders received.

## 2020-09-21 NOTE — H&P (Signed)
History and Physical  Dominique Hinton WFU:[id number] DOB: 10/03/1970 DOA: 09/20/2020  Referring physician: Nettie Elm, PA-C PCP: Asencion Noble, MD  Patient coming from: Home  Chief Complaint: Chest Pain  HPI: Dominique Hinton is a 50 y.o. female with medical history significant for Hypertension, tobacco abuse and Obesity who presents to the emergency department due to sudden onset of midsternal/left-sided chest pain that woke her up from sleep this afternoon around 2 PM.  Chest pain was described as pressure-like with a sharp component, it was nonreproducible and was associated with nausea without vomiting or diaphoresis.  Chest pain was rated as 6/10 on pain scale.  She complains of chronic left shoulder pain, and she was not sure if the chest pain radiated to left shoulder, but the pain was mainly felt underneath the left breast.  Pain was intermittent and after about 1 hour of chest pain not resolving, EMS was activated, on arrival of EMS team, aspirin 324 Mg x1 and nitroglycerin sublingual was given with only mild relief of chest pain and she was taken to the ED for further evaluation and management.    ED Course: In the emergency department, she was hemodynamically stable.  Work-up in the ED showed normal CBC and BMP except for elevated hemoglobin at 15.4.  Troponin x2 -7 > 86.  Chest x-ray showed no active cardiopulmonary disease.  She was treated with sublingual nitroglycerin and IV morphine with improvement in chest pain (now rated as 3/10 on pain scale).  Cardiology fellow at [facility] was consulted and recommended heparin drip and nitroglycerin and to consult cardiology in the morning per ED PA.  Hospitalist was asked admit patient for further evaluation and management.  Review of Systems: Constitutional: Negative for chills and fever.  HENT: Negative for ear pain and sore throat.   Eyes: Negative for pain and visual disturbance.  Respiratory: Negative for cough, chest tightness  and shortness of breath.   Cardiovascular: Positive for chest pain and negative for palpitations.  Gastrointestinal: Negative for abdominal pain and vomiting.  Endocrine: Negative for polyphagia and polyuria.  Genitourinary: Negative for decreased urine volume, dysuria, enuresis Musculoskeletal: Negative for arthralgias and back pain.  Skin: Negative for color change and rash.  Allergic/Immunologic: Negative for immunocompromised state.  Neurological: Negative for tremors, syncope, speech difficulty, weakness, light-headedness and headaches.  Hematological: Does not bruise/bleed easily.  All other systems reviewed and are negative   Past Medical History:  Diagnosis Date  . Anxiety   . Arthritis   . Cervical pain   . Hypertension    Past Surgical History:  Procedure Laterality Date  . ABDOMINAL HYSTERECTOMY    . APPENDECTOMY    . BREAST SURGERY    . CHOLECYSTECTOMY    . TUBAL LIGATION      Social History:  reports that she has been smoking cigarettes. She has been smoking about 1.00 pack per day. She has never used smokeless tobacco. She reports current alcohol use. She reports current drug use. Drug: Marijuana.   Allergies  Allergen Reactions  . Cortisone Shortness Of Breath and Other (See Comments)    Made me feel loopy  . Doxycycline Rash    Family History  Problem Relation Age of Onset  . Other Mother        "borderline diabetic"  . Alzheimer's disease Father   . Hypertension Other      Prior to Admission medications   Medication Sig Start Date End Date Taking? Authorizing Provider  citalopram (CELEXA)  20 MG tablet Take 20 mg by mouth daily. 01/21/18   [provider]  DULoxetine (CYMBALTA) 60 MG capsule Take 1 capsule (60 mg total) by mouth daily. 06/26/19   Levert FeinsteinYan, Yijun, MD  hydrochlorothiazide (HYDRODIURIL) 25 MG tablet Take 25 mg by mouth daily. 01/26/19   [provider]  losartan (COZAAR) 100 MG tablet Take 100 mg by mouth daily. 01/26/19    [provider]  meloxicam (MOBIC) 15 MG tablet Take 15 mg by mouth daily. 12/08/18   [provider]  valACYclovir (VALTREX) 1000 MG tablet Take 1,000 mg by mouth daily. 05/06/19   [provider]    Physical Exam: BP 124/78 (BP Location: Right Arm)   Pulse 63   Temp 98.3 F (36.8 C) (Oral)   Resp 17   Ht 5\' 6"  (1.676 m)   Wt 90.7 kg   SpO2 100%   BMI 32.28 kg/m   . General: 50 y.o. year-old female well developed well nourished in no acute distress.  Alert and oriented x3. Marland Kitchen. HEENT: NCAT, EOMI . Neck: Supple, Trachea medial . Cardiovascular: Regular rate and rhythm with no rubs or gallops.  No thyromegaly or JVD noted.  2/4 pulses in all 4 extremities. Marland Kitchen. Respiratory: Clear to auscultation with no wheezes or rales. Good inspiratory effort. . Abdomen: Soft nontender nondistended with normal bowel sounds x4 quadrants. . Muskuloskeletal: No cyanosis, clubbing or edema noted bilaterally . Neuro: CN II-XII intact, strength, sensation, reflexes . Skin: No ulcerative lesions noted or rashes . Psychiatry: Judgement and insight appear normal. Mood is appropriate for condition and setting          Labs on Admission:  Basic Metabolic Panel: Recent Labs  Lab 09/20/20 1632  NA 136  K 3.5  CL 107  CO2 22  GLUCOSE 99  BUN 8  CREATININE 0.82  CALCIUM 9.1   Liver Function Tests: No results for input(s): AST, ALT, ALKPHOS, BILITOT, PROT, ALBUMIN in the last 168 hours. No results for input(s): LIPASE, AMYLASE in the last 168 hours. No results for input(s): AMMONIA in the last 168 hours. CBC: Recent Labs  Lab 09/20/20 1632  WBC 7.7  NEUTROABS 5.3  HGB 15.4*  HCT 42.9  MCV 88.6  PLT 271   Cardiac Enzymes: No results for input(s): CKTOTAL, CKMB, CKMBINDEX, TROPONINI in the last 168 hours.  BNP (last 3 results) No results for input(s): BNP in the last 8760 hours.  ProBNP (last 3 results) No results for input(s): PROBNP in the last 8760  hours.  CBG: No results for input(s): GLUCAP in the last 168 hours.  Radiological Exams on Admission: DG Chest 2 View  Result Date: 09/20/2020 CLINICAL DATA:  Chest pressure and nausea EXAM: CHEST - 2 VIEW COMPARISON:  02/26/2020 FINDINGS: Midline trachea.  Normal heart size and mediastinal contours. Sharp costophrenic angles.  No pneumothorax.  Clear lungs. Numerous leads and wires project over the chest. IMPRESSION: No active cardiopulmonary disease. Electronically Signed   By: Jeronimo GreavesKyle  Talbot M.D.   On: 09/20/2020 17:39    EKG: I independently viewed the EKG done and my findings are as followed: Normal sinus rhythm at a rate of 74bpm  Assessment/Plan Present on Admission: **None**  Principal Problem:   Chest pain Active Problems:   Obesity (BMI 30.0-34.9)   Essential hypertension  Chest Pain possibly due to NSTEMI Continue telemetry  Troponins x 2 - 7 > 86 > 825 > 1318; continue to trend troponin IV heparin drip was started as recommended  by [facility] cardiology fellow by ED PA EKG shows normal sinus rhythm at a rate of 74 bpm Cardiology consult placed for further recommendation    Continue aspirin, nitroglycerin  Essential hypertension (controlled) Continue Cozaar and HCTZ  Obesity (BMI 32.28) Patient was counseled on diet and lifestyle modification   Tobacco abuse Patient has 30-year smoking history She was counseled on tobacco abuse cessation  DVT prophylaxis: Heparin drip  Code Status: Full code  Family Communication: None at bedside  Disposition Plan:  Patient is from:                        home Anticipated DC to:                    home Anticipated DC date:                1 day Anticipated DC barriers:           Patient is unstable to be discharged at this time due to having chest pain with elevated troponin and requiring cardiology consult  Consults called: Cardiology  Admission status: Observation    Bernadette Hoit MD [facility]  09/20/2020,  9:49 PM

## 2020-09-21 NOTE — Consult Note (Addendum)
Cardiology Consult    Patient ID: SOKHA CINCO; [id number]; 12-03-1970   Admit date: 09/20/2020 Date of Consult: 09/21/2020  Primary Care Provider: Asencion Noble, MD Primary Cardiologist: New to [facility] - Dr. Domenic Polite  Patient Profile    Dominique Hinton is a 50 y.o. female with past medical history of HTN and tobacco use who is being seen today for the evaluation of an NSTEMI at the request of Dr. Josephine Cables.   History of Present Illness    Dominique Hinton presented to [facility] ED on 09/20/2020 for evaluation of chest pressure. She reports being in her usual state of health until yesterday afternoon when she developed a discomfort/pressure which radiated from her sternum and underneath her left breast into her neck and down her left arm. She walked around the house and symptoms did not improve. Says she felt like she needed to belch. Reports associated nausea but no vomiting or diaphoresis. She is not active at baseline secondary to back pain and says she has been participating in PT with improvement in her symptoms. No recent chest pain or dyspnea on exertion prior to yesterday. No known history of CAD, CHF or cardiac arrhythmias. No known family history of cardiac issues. She does have known HTN. Not aware of a diagnosis or HLD or Type 2 DM. She does work at [employer].   Initial labs show WBC 7.7, Hgb 15.4, platelets 271, +136, K+ 3.5 and creatinine 0.82. COVID negative. Initial HS Troponin 86 with repeat values of 825, 1318, 1690 and 2617. CXR with no active cardiopulmonary disease. EKG shows NSR, HR 74 with no distinct ST abnormalities but significant baseline artifact. Repeat tracing this AM shows isolated TWI along AVL.   She has been started on Heparin along with ASA 81mg  daily. Was initially on IV NTG but developed a significant headache with this along with nausea and vomiting, therefore this was discontinued. Denies any pain at this time.    Past Medical History:  Diagnosis Date   . Anxiety   . Arthritis   . Cervical pain   . Hypertension     Past Surgical History:  Procedure Laterality Date  . ABDOMINAL HYSTERECTOMY    . APPENDECTOMY    . BREAST SURGERY    . CHOLECYSTECTOMY    . TUBAL LIGATION       Home Medications:  Prior to Admission medications   Medication Sig Start Date End Date Taking? Authorizing Provider  citalopram (CELEXA) 20 MG tablet Take 20 mg by mouth daily. 01/21/18   [provider]  DULoxetine (CYMBALTA) 60 MG capsule Take 1 capsule (60 mg total) by mouth daily. 06/26/19   Marcial Pacas, MD  hydrochlorothiazide (HYDRODIURIL) 25 MG tablet Take 25 mg by mouth daily. 01/26/19   [provider]  losartan (COZAAR) 100 MG tablet Take 100 mg by mouth daily. 01/26/19   [provider]  meloxicam (MOBIC) 15 MG tablet Take 15 mg by mouth daily. 12/08/18   [provider]  valACYclovir (VALTREX) 1000 MG tablet Take 1,000 mg by mouth daily. 05/06/19   [provider]    Inpatient Medications: Scheduled Meds: . aspirin EC  81 mg Oral Daily  . Chlorhexidine Gluconate Cloth  6 each Topical Daily  . hydrochlorothiazide  25 mg Oral Daily  . losartan  100 mg Oral Daily   Continuous Infusions: . heparin 1,100 Units/hr (09/21/20 0805)   PRN Meds: acetaminophen, nitroGLYCERIN, ondansetron (ZOFRAN) IV  Allergies:    Allergies  Allergen Reactions  . Cortisone Shortness Of Breath and Other (See Comments)    Made me feel loopy  . Doxycycline Rash    Social History:   Social History   Socioeconomic History  . Marital status: Legally Separated    Spouse name: Not on file  . Number of children: 3  . Years of education: 45  . Highest education level: Not on file  Occupational History  . Occupation: waitress/cook  Tobacco Use  . Smoking status: Current Every Day Smoker    Packs/day: 1.00    Types: Cigarettes  . Smokeless tobacco: Never Used  Vaping Use  . Vaping Use: Never used  Substance and Sexual  Activity  . Alcohol use: Yes    Comment: occasionally  . Drug use: Yes    Types: Marijuana    Comment: occasionally  . Sexual activity: Yes    Birth control/protection: None  Other Topics Concern  . Not on file  Social History Narrative   Lives at home with daughter, Joslyn Hy and fiance.   10-12 cups caffeine per day.   Right-handed.   Social Determinants of Health   Financial Resource Strain: Not on file  Food Insecurity: Not on file  Transportation Needs: Not on file  Physical Activity: Not on file  Stress: Not on file  Social Connections: Not on file  Intimate Partner Violence: Not on file     Family History:    Family History  Problem Relation Age of Onset  . Other Mother        "borderline diabetic"  . Alzheimer's disease Father   . Hypertension Other       Review of Systems    General:  No chills, fever, night sweats or weight changes.  Cardiovascular:  No dyspnea on exertion, edema, orthopnea, palpitations, paroxysmal nocturnal dyspnea. Positive for chest pain.  Dermatological: No rash, lesions/masses Respiratory: No cough, dyspnea Urologic: No hematuria, dysuria Abdominal:   No nausea, vomiting, diarrhea, bright red blood per rectum, melena, or hematemesis Neurologic:  No visual changes, wkns, changes in mental status. All other systems reviewed and are otherwise negative except as noted above.  Physical Exam/Data    Vitals:   09/21/20 0500 09/21/20 0600 09/21/20 0700 09/21/20 0800  BP: (!) 147/92  126/84 (!) 145/94  Pulse: (!) 56 60 (!) 55 61  Resp: 16 17 17 16   Temp:      TempSrc:      SpO2: 98% 100% 95% 97%  Weight:      Height:        Intake/Output Summary (Last 24 hours) at 09/21/2020 0807 Last data filed at 09/21/2020 0805 Gross per 24 hour  Intake 146.77 ml  Output -  Net 146.77 ml   Filed Weights   09/20/20 1614  Weight: 90.7 kg   Body mass index is 32.28 kg/m.   General: Pleasant female appearing in NAD Psych: Normal  affect. Neuro: Alert and oriented X 3. Moves all extremities spontaneously. HEENT: Normal  Neck: Supple without bruits or JVD. Lungs:  Resp regular and unlabored, CTA without wheezing or rales. Heart: RRR no s3, s4, or murmurs. Abdomen: Soft, non-tender, non-distended, BS + x 4.  Extremities: No clubbing, cyanosis or edema. DP/PT/Radials 2+ and equal bilaterally.   EKG:  The EKG was personally reviewed and demonstrates: NSR, HR 74 with no distinct ST abnormalities but significant baseline artifact. Repeat tracing this AM shows isolated TWI along AVL.   Telemetry:  Telemetry was personally reviewed and demonstrates:  Sinus bradycardia, HR in 50's to 60's.   Labs/Studies     Relevant CV Studies:  None on File  Laboratory Data:  Chemistry Recent Labs  Lab 09/20/20 1632 09/21/20 0354  NA 136 136  K 3.5 3.5  CL 107 107  CO2 22 22  GLUCOSE 99 141*  BUN 8 8  CREATININE 0.82 0.81  CALCIUM 9.1 8.7*  GFRNONAA >60 >60  ANIONGAP 7 7    Recent Labs  Lab 09/21/20 0354  PROT 6.6  ALBUMIN 3.6  AST 50*  ALT 54*  ALKPHOS 53  BILITOT 0.9   Hematology Recent Labs  Lab 09/20/20 1632 09/21/20 0354  WBC 7.7 7.5  7.2  RBC 4.84 5.08  4.94  HGB 15.4* 16.0*  15.8*  HCT 42.9 45.6  44.8  MCV 88.6 89.8  90.7  MCH 31.8 31.5  32.0  MCHC 35.9 35.1  35.3  RDW 12.5 12.5  12.7  PLT 271 284  286   Cardiac EnzymesNo results for input(s): TROPONINI in the last 168 hours. No results for input(s): TROPIPOC in the last 168 hours.  BNPNo results for input(s): BNP, PROBNP in the last 168 hours.  DDimer No results for input(s): DDIMER in the last 168 hours.  Radiology/Studies:  DG Chest 2 View  Result Date: 09/20/2020 CLINICAL DATA:  Chest pressure and nausea EXAM: CHEST - 2 VIEW COMPARISON:  02/26/2020 FINDINGS: Midline trachea.  Normal heart size and mediastinal contours. Sharp costophrenic angles.  No pneumothorax.  Clear lungs. Numerous leads and wires project over the chest.  IMPRESSION: No active cardiopulmonary disease. Electronically Signed   By: Abigail Miyamoto M.D.   On: 09/20/2020 17:39     Assessment & Plan    1. NSTEMI - She presented with chest pressure which started yesterday afternoon and radiated into her neck and left arm with associated nausea. Found to have an NSTEMI with HS Troponin values trending up to 2617. EKG is without acute ST abnormalities. Will check FLP and Hgb A1c for risk stratification.  - Will plan for transfer to [facility] today on the Cardiology service and a cardiac catheterization this afternoon. She has been NPO since midnight. The patient understands that risks include but are not limited to stroke (1 in 1000), death (1 in 94), kidney failure [usually temporary] (1 in 500), bleeding (1 in 200), allergic reaction [possibly serious] (1 in 200).   - Continue Heparin along with ASA 81mg  daily and Losartan 100mg  daily. Will add Crestor 20mg  daily (LFT's mildly elevated with AST 50 and ALT 54 so would recheck as an outpatient). No BB given baseline HR in the 50's.   2. HTN - BP has been variable from 116/81 - 179/118 since admission, at 145/83 on most recent check. Will continue Losartan 100mg  daily. Hold PTA HCTZ in anticipation of cardiac catheterization.   3. Tobacco Use - She continues to smoke 1 ppd. Cessation advised.    For questions or updates, please contact [place] Please consult [email or web address] for contact info under Cardiology/STEMI.  Signed, Erma Heritage, PA-C 09/21/2020, 8:07 AM Pager: [phone]   Attending note:  Patient seen and examined.  I reviewed her records and discussed the case with the Delano Metz, I agree with her above findings.  Dominique Hinton presents with recent onset chest pressure associated with nausea, no shortness of breath or palpitations.  She has a history of hypertension and tobacco abuse at baseline.  On examination this morning she appears comfortable at rest, no active  chest  discomfort and in no distress.  Heart rate in the 60s in sinus rhythm by telemetry which I personally reviewed.  Systolic blood pressure 0000000 to 150s.  Lungs are clear without labored breathing.  Cardiac exam reveals RRR without gallop.  Pertinent lab work includes potassium 3.5, creatinine 0.81, AST 50, ALT 54, peak high-sensitivity troponin I of 2695, hemoglobin 15.8, platelets 284, SARS coronavirus 2 test negative.  Chest x-ray reports no acute findings.  ECG shows possible left atrial enlargement, decreased anteroseptal R wave progression.  NSTEMI, currently pain-free and with peak high-sensitivity troponin I of 2695 at this point.  Risks and benefits of a diagnostic cardiac catheterization discussed, patient is in agreement to proceed.  Plan is transfer to [facility] on the cardiology service for cardiac catheterization today.  Keep NPO.  Continue IV heparin, also aspirin, losartan, and Crestor.  Holding off beta-blocker with heart rate at 60.  Satira Sark, M.D., F.A.C.C.

## 2020-09-21 NOTE — Progress Notes (Signed)
Results for BRITTANYANN, WITTNER (MRN [id number]) as of 09/21/2020 08:31  Ref. Range 09/21/2020 07:47  Troponin I (High Sensitivity) Latest Ref Range: <18 ng/L 2,695 [facility])    Critical result reported to Dr. Denton Brick. No new orders received.

## 2020-09-21 NOTE — ED Notes (Signed)
Date and time results received: 09/21/20 2355   Test: troponin Critical Value: 825  Name of Provider Notified: Dr. Cloyd Stagers  Orders Received? Or Actions Taken? Nitro gtt stopped, ekg ordered, mobic ordered all per MAR.

## 2020-09-21 NOTE — Progress Notes (Signed)
[place] for Heparin Indication: chest pain/ACS  Allergies  Allergen Reactions  . Cortisone Shortness Of Breath and Other (See Comments)    Made me feel loopy  . Doxycycline Rash    Patient Measurements: Height: 5\' 6"  (167.6 cm) Weight: 90.7 kg (200 lb) IBW/kg (Calculated) : 59.3 Heparin Dosing Weight: 79.1 kg   Vital Signs: Temp: 98.3 F (36.8 C) (01/17 2000) Temp Source: Oral (01/17 2000) BP: 137/93 (01/18 0300) Pulse Rate: 60 (01/18 0300)  Labs: Recent Labs    09/20/20 1632 09/20/20 1850 09/20/20 2216 09/21/20 0014 09/21/20 0354  HGB 15.4*  --   --   --  16.0*  15.8*  HCT 42.9  --   --   --  45.6  44.8  PLT 271  --   --   --  284  286  APTT  --   --   --   --  40*  LABPROT  --   --   --   --  13.2  INR  --   --   --   --  1.0  HEPARINUNFRC  --   --   --   --  <0.10*  CREATININE 0.82  --   --   --  0.81  TROPONINIHS 7   < > 825* 1,318* 1,690*   < > = values in this interval not displayed.    Estimated Creatinine Clearance: 95.4 mL/min (by C-G formula based on SCr of 0.81 mg/dL).   Medical History: Past Medical History:  Diagnosis Date  . Anxiety   . Arthritis   . Cervical pain   . Hypertension     Assessment: 50 yo female presented on 09/20/2020 with chest pressure for 2 hours. Pharmacy consulted to dose heparin for ACS. No anticoagulation prior to admission. Troponin 86. Hgb 15.4. Plt wnl. No bleeding noted.   1/18 AM update:  Heparin level low No issues per RN  Goal of Therapy:  Heparin level 0.3-0.7 units/ml Monitor platelets by anticoagulation protocol: Yes   Plan:  Heparin 2000 units x1 bolus  Inc heparin to 1100 units/hr 1300 heparin level Monitor heparin level, CBC, and S/S of bleeding daily   Narda Bonds, PharmD, BCPS Clinical Pharmacist Phone: [phone]

## 2020-09-21 NOTE — ED Notes (Signed)
Pt resting in bed, reports nausea is gone, headache is resolving and denies chest pain at this time. Heparin infusing without complication. NAD noted.

## 2020-09-21 NOTE — Progress Notes (Signed)
*  PRELIMINARY RESULTS* Echocardiogram 2D Echocardiogram has been performed.  Dominique Hinton 09/21/2020, 9:23 AM

## 2020-09-21 NOTE — Progress Notes (Signed)
[place] for Heparin Indication: chest pain/ACS  Allergies  Allergen Reactions  . Cortisone Shortness Of Breath and Other (See Comments)    Made me feel loopy  . Doxycycline Rash    Patient Measurements: Height: 5\' 6"  (167.6 cm) Weight: 91.2 kg (201 lb 1 oz) IBW/kg (Calculated) : 59.3 Heparin Dosing Weight: 79.1 kg   Vital Signs: BP: 127/77 (01/18 1300) Pulse Rate: 63 (01/18 1300)  Labs: Recent Labs    09/20/20 1632 09/20/20 1850 09/21/20 0354 09/21/20 0555 09/21/20 0747 09/21/20 0928 09/21/20 1250  HGB 15.4*  --  16.0*  15.8*  --   --   --   --   HCT 42.9  --  45.6  44.8  --   --   --   --   PLT 271  --  284  286  --   --   --   --   APTT  --   --  40*  --   --   --   --   LABPROT  --   --  13.2  --   --   --   --   INR  --   --  1.0  --   --   --   --   HEPARINUNFRC  --   --  <0.10*  --   --   --  0.22*  CREATININE 0.82  --  0.81  --   --   --   --   TROPONINIHS 7   < > 1,690* 2,617* 2,695* 2,379*  --    < > = values in this interval not displayed.    Estimated Creatinine Clearance: 95.6 mL/min (by C-G formula based on SCr of 0.81 mg/dL).   Medical History: Past Medical History:  Diagnosis Date  . Anxiety   . Arthritis   . Cervical pain   . Hypertension     Assessment: 50 yo female presented on 09/20/2020 with chest pressure for 2 hours. Pharmacy consulted to dose heparin for ACS. No anticoagulation prior to admission. Troponin 86. Hgb 15.4. Plt wnl. No bleeding noted.   1/18 Heparin level 0.22 increased but still subtherapeutic No issues per RN  Goal of Therapy:  Heparin level 0.3-0.7 units/ml Monitor platelets by anticoagulation protocol: Yes   Plan:  Heparin 1000 units x1 bolus  Inc heparin to 1250 units/hr 2000 heparin level Monitor heparin level, CBC, and S/S of bleeding daily   Isac Sarna, BS [place]ena [place], BCPS Clinical Pharmacist Pager [phone]

## 2020-09-22 ENCOUNTER — Other Ambulatory Visit (HOSPITAL_COMMUNITY): Payer: Self-pay | Admitting: Cardiology

## 2020-09-22 DIAGNOSIS — I10 Essential (primary) hypertension: Secondary | ICD-10-CM

## 2020-09-22 DIAGNOSIS — Z720 Tobacco use: Secondary | ICD-10-CM | POA: Diagnosis not present

## 2020-09-22 DIAGNOSIS — R778 Other specified abnormalities of plasma proteins: Secondary | ICD-10-CM

## 2020-09-22 DIAGNOSIS — Z9861 Coronary angioplasty status: Secondary | ICD-10-CM

## 2020-09-22 DIAGNOSIS — I200 Unstable angina: Secondary | ICD-10-CM | POA: Diagnosis not present

## 2020-09-22 DIAGNOSIS — I214 Non-ST elevation (NSTEMI) myocardial infarction: Secondary | ICD-10-CM | POA: Diagnosis not present

## 2020-09-22 LAB — CBC
HCT: 43 % (ref 36.0–46.0)
Hemoglobin: 14.9 g/dL (ref 12.0–15.0)
MCH: 30.6 pg (ref 26.0–34.0)
MCHC: 34.7 g/dL (ref 30.0–36.0)
MCV: 88.3 fL (ref 80.0–100.0)
Platelets: 261 10*3/uL (ref 150–400)
RBC: 4.87 MIL/uL (ref 3.87–5.11)
RDW: 12.5 % (ref 11.5–15.5)
WBC: 6.1 10*3/uL (ref 4.0–10.5)
nRBC: 0 % (ref 0.0–0.2)

## 2020-09-22 LAB — BASIC METABOLIC PANEL
Anion gap: 8 (ref 5–15)
BUN: 7 mg/dL (ref 6–20)
CO2: 19 mmol/L — ABNORMAL LOW (ref 22–32)
Calcium: 8.2 mg/dL — ABNORMAL LOW (ref 8.9–10.3)
Chloride: 109 mmol/L (ref 98–111)
Creatinine, Ser: 0.79 mg/dL (ref 0.44–1.00)
GFR, Estimated: >60 mL/min (ref >60)
Glucose, Bld: 98 mg/dL (ref 70–99)
Potassium: 3.7 mmol/L (ref 3.5–5.1)
Sodium: 136 mmol/L (ref 135–145)

## 2020-09-22 LAB — LIPID PANEL
Cholesterol: 185 mg/dL (ref 0–200)
HDL: 24 mg/dL — ABNORMAL LOW (ref >40)
LDL Cholesterol: 125 mg/dL — ABNORMAL HIGH (ref 0–99)
Total CHOL/HDL Ratio: 7.7 RATIO
Triglycerides: 179 mg/dL — ABNORMAL HIGH (ref <150)
VLDL: 36 mg/dL (ref 0–40)

## 2020-09-22 MED ORDER — PANTOPRAZOLE SODIUM 40 MG PO TBEC
40.0000 mg | DELAYED_RELEASE_TABLET | Freq: Every day | ORAL | Status: DC
  Administered 2020-09-22: 40 mg via ORAL
  Filled 2020-09-22: qty 1

## 2020-09-22 MED ORDER — ROSUVASTATIN CALCIUM 20 MG PO TABS: 20.0000 mg | ORAL_TABLET | Freq: Every day | ORAL | 1 refills | Status: DC

## 2020-09-22 MED ORDER — NITROGLYCERIN 0.4 MG SL SUBL: 0.4000 mg | SUBLINGUAL_TABLET | SUBLINGUAL | 2 refills | Status: DC | PRN

## 2020-09-22 MED ORDER — TICAGRELOR 90 MG PO TABS: 90.0000 mg | ORAL_TABLET | Freq: Two times a day (BID) | ORAL | 2 refills | Status: DC

## 2020-09-22 MED ORDER — PANTOPRAZOLE SODIUM 40 MG PO TBEC: 40.0000 mg | DELAYED_RELEASE_TABLET | Freq: Every day | ORAL | 1 refills | Status: DC

## 2020-09-22 MED ORDER — ASPIRIN 81 MG PO CHEW: 81.0000 mg | CHEWABLE_TABLET | Freq: Every day | ORAL | 1 refills | Status: AC

## 2020-09-22 MED FILL — PANTOPRAZOLE SOD DR 40 MG T: 40 | 30 days supply | Qty: 30 | Fill #0

## 2020-09-22 MED FILL — NITROGLYCERIN 0.4 MG TAB SL: 0.4 | 7 days supply | Qty: 25 | Fill #0

## 2020-09-22 MED FILL — BRILINTA 90 MG TABLET: 90 | 90 days supply | Qty: 180 | Fill #0

## 2020-09-22 MED FILL — ROSUVASTATIN CALCIUM 20 MG: 20 | 90 days supply | Qty: 90 | Fill #0

## 2020-09-22 MED FILL — ASPIRIN LOW DOSE 81 MG CHEW: 81 | 90 days supply | Qty: 90 | Fill #0

## 2020-09-22 NOTE — Progress Notes (Signed)
CARDIAC REHAB PHASE I   PRE:  Rate/Rhythm: 62 SR  BP:  Supine:   Sitting: 129/94  Standing:    SaO2: 96%RA  MODE:  Ambulation: 400 ft   POST:  Rate/Rhythm: 84 SR  BP:  Supine: 153/91  Sitting:   Standing:    SaO2: 98%RA 0807-0900 Pt walked 400 ft on RA with steady gait. No CP. Tolerated well. MI education completed with pt who voiced understanding. Stressed importance of brilinta. Reviewed NTG use, MI restrictions, walking as tolerated with her back pain, heart healthy food choices, smoking cessation and CRP 2. Gave smoking cessation handout and encouraged pt to call [phone]. Pt has been smoking outside which has cut down some. Will consider quitting. Discussed CRP 2 and referred to [place].    Graylon Good, RN BSN  09/22/2020 8:54 AM

## 2020-09-22 NOTE — Discharge Summary (Addendum)
Discharge Summary    Patient ID: Dominique Hinton MRN: [id number]; DOB: Nov 30, 1970  Admit date: 09/20/2020 Discharge date: 09/22/2020  Primary Care Provider: Asencion Noble, MD  Primary Cardiologist: Dominique Lesches, MD  Primary Electrophysiologist:  None   Discharge Diagnoses    Principal Problem:   Chest pain Active Problems:   Obesity (BMI 30.0-34.9)   Essential hypertension   Tobacco abuse   Elevated troponin   NSTEMI (non-ST elevated myocardial infarction) (HCC)   S/P PTCA (percutaneous transluminal coronary angioplasty)    Diagnostic Studies/Procedures    Cath: 09/21/20   1st Mrg lesion is 75% stenosed. This is a very small vessel.  Lat 1st Diag lesion is 100% stenosed. Given the wall motion abnormality on ventriculogram, we decided to intervene.  Balloon angioplasty was performed using a BALLOON SAPPHIRE 1.25X10. It turned out to be a small vessel.  Post intervention, there is a 25% residual stenosis. The vessel was too small for stenting.  The left ventricular systolic function is normal.  LV end diastolic pressure is normal.  The left ventricular ejection fraction is 50-55% by visual estimate.  There is no aortic valve stenosis.   Continue aggressive medical therapy with DAPT.  Could change Brilinta to Plavix since no stent was placed.   She needs to stop smoking.  Healthy diet, BP and blood sugar control would also be helpful.   Diagnostic Dominance: Right    Intervention    Echo: 09/21/20  IMPRESSIONS    1. Left ventricular ejection fraction, by estimation, is 55 to 60%. The  left ventricle has normal function. The left ventricle has no regional  wall motion abnormalities. There is mild left ventricular hypertrophy.  Left ventricular diastolic parameters  are indeterminate.  2. Right ventricular systolic function is normal. The right ventricular  size is normal. Tricuspid regurgitation signal is inadequate for assessing  PA pressure.   3. The mitral valve is grossly normal. Trivial mitral valve  regurgitation.  4. The aortic valve is tricuspid. Aortic valve regurgitation is not  visualized.  5. The inferior vena cava is normal in size with greater than 50%  respiratory variability, suggesting right atrial pressure of 3 mmHg.  _____________   History of Present Illness     Dominique Hinton is a 50 y.o. female with past medical history of HTN and tobacco use. [name] presented to [facility] ED on 09/20/2020 for evaluation of chest pressure. She reported being in her usual state of health until the afternoon prior to admission when she developed a discomfort/pressure which radiated from her sternum and underneath her left breast into her neck and down her left arm. She walked around the house and symptoms did not improve. Said she felt like she needed to belch. Reported associated nausea but no vomiting or diaphoresis. She is not active at baseline secondary to back pain and says she has been participating in PT with improvement in her symptoms. No recent chest pain or dyspnea on exertion prior to yesterday. No known history of CAD, CHF or cardiac arrhythmias. No known family history of cardiac issues. She does have known HTN. Not aware of a diagnosis or HLD or Type 2 DM. She does work at [employer].   Initial labs show WBC 7.7, Hgb 15.4, platelets 271, +136, K+ 3.5 and creatinine 0.82. COVID negative. Initial HS Troponin 86 with repeat values of 825, 1318, 1690 and 2617. CXR with no active cardiopulmonary disease. EKG showed NSR, HR 74 with no distinct ST  abnormalities but significant baseline artifact. Repeat tracing this AM shows isolated TWI along AVL.   She has been started on Heparin along with ASA 81mg  daily. Was initially on IV NTG but developed a significant headache with this along with nausea and vomiting, therefore this was discontinued. She was evaluated by Dr. Domenic Hinton with plans to transfer to [facility] for  further management with cardiac cath.  Hospital Course     1. NSTEMI: Presented with chest pain and found to have a NSTEMI with hsTn peaking at 2617. Started on IV heparin and brought to the cath lab at [facility] from [facility]. Underwent cath noted above with balloon angioplasty to the 1st Diag which was 100% occluded. Post intervention with 25% residual stenosis, but vessel noted to small for stenting. Placed on DAPT with ASA/plavix. No complications noted overnight. Worked well with cardiac rehab.   2. HTN: Blood pressures were slightly elevated post cath. Continue on losartan 100mg  daily, and plan to resume HCTZ at discharge.  3. HLD: LDL 125, started on high dose statin -- FLP/LFTs in 8 weeks  4. Tobacco use: cessation advised  5. Chronic back pain: on Mobic PTA. Says her MD was going to switch her to something else as an outpatient. Will start on protonix at discharge. Instructed to monitor for occult bleeding with need for DAPT.   Did the patient have an acute coronary syndrome (MI, NSTEMI, STEMI, etc) this admission?:  Yes                               AHA/ACC Clinical Performance & Quality Measures: 1. Aspirin prescribed? - Yes 2. ADP Receptor Inhibitor (Plavix/Clopidogrel, Brilinta/Ticagrelor or Effient/Prasugrel) prescribed (includes medically managed patients)? - Yes 3. Beta Blocker prescribed? - No - bradycardia 4. High Intensity Statin (Lipitor 40-80mg  or Crestor 20-40mg ) prescribed? - Yes 5. EF assessed during THIS hospitalization? - Yes 6. For EF <40%, was ACEI/ARB prescribed? - Yes 7. For EF <40%, Aldosterone Antagonist (Spironolactone or Eplerenone) prescribed? - Not Applicable (EF >/= [id number]) 8. Cardiac Rehab Phase II ordered (including medically managed patients)? - Yes       _____________  Discharge Vitals Blood pressure (!) 140/93, pulse 61, temperature 98.5 F (36.9 C), temperature source Oral, resp. rate 16, height 5\' 6"  (1.676 m), weight 85.1 kg, SpO2 98 %.  Filed  Weights   09/20/20 1614 09/21/20 0940 09/22/20 0533  Weight: 90.7 kg 91.2 kg 85.1 kg    Labs & Radiologic Studies    CBC Recent Labs    09/20/20 1632 09/21/20 0354 09/22/20 0301  WBC 7.7 7.5  7.2 6.1  NEUTROABS 5.3  --   --   HGB 15.4* 16.0*  15.8* 14.9  HCT 42.9 45.6  44.8 43.0  MCV 88.6 89.8  90.7 88.3  PLT 271 284  286 0000000   Basic Metabolic Panel Recent Labs    09/21/20 0354 09/22/20 0301  NA 136 136  K 3.5 3.7  CL 107 109  CO2 22 19*  GLUCOSE 141* 98  BUN 8 7  CREATININE 0.81 0.79  CALCIUM 8.7* 8.2*  MG 2.0  --   PHOS 2.3*  --    Liver Function Tests Recent Labs    09/21/20 0354  AST 50*  ALT 54*  ALKPHOS 53  BILITOT 0.9  PROT 6.6  ALBUMIN 3.6   No results for input(s): LIPASE, AMYLASE in the last 72 hours. High Sensitivity Troponin:   Recent  Labs  Lab 09/21/20 0014 09/21/20 0354 09/21/20 0555 09/21/20 0747 09/21/20 0928  TROPONINIHS 1,318* 1,690* 2,617* 2,695* 2,379*    BNP Invalid input(s): POCBNP D-Dimer No results for input(s): DDIMER in the last 72 hours. Hemoglobin A1C Recent Labs    09/21/20 0928  HGBA1C 4.9   Fasting Lipid Panel Recent Labs    09/22/20 0301  CHOL 185  HDL 24*  LDLCALC 125*  TRIG 179*  CHOLHDL 7.7   Thyroid Function Tests No results for input(s): TSH, T4TOTAL, T3FREE, THYROIDAB in the last 72 hours.  Invalid input(s): FREET3 _____________  DG Chest 2 View  Result Date: 09/20/2020 CLINICAL DATA:  Chest pressure and nausea EXAM: CHEST - 2 VIEW COMPARISON:  02/26/2020 FINDINGS: Midline trachea.  Normal heart size and mediastinal contours. Sharp costophrenic angles.  No pneumothorax.  Clear lungs. Numerous leads and wires project over the chest. IMPRESSION: No active cardiopulmonary disease. Electronically Signed   By: Abigail Miyamoto M.D.   On: 09/20/2020 17:39   CARDIAC CATHETERIZATION  Result Date: 09/21/2020  1st Mrg lesion is 75% stenosed. This is a very small vessel.  Lat 1st Diag lesion is 100%  stenosed. Given the wall motion abnormality on ventriculogram, we decided to intervene.  Balloon angioplasty was performed using a BALLOON SAPPHIRE 1.25X10. It turned out to be a small vessel.  Post intervention, there is a 25% residual stenosis. The vessel was too small for stenting.  The left ventricular systolic function is normal.  LV end diastolic pressure is normal.  The left ventricular ejection fraction is 50-55% by visual estimate.  There is no aortic valve stenosis.  Continue aggressive medical therapy with DAPT.  Could change Brilinta to Plavix since no stent was placed. She needs to stop smoking.  Healthy diet, BP and blood sugar control would also be helpful.    ECHOCARDIOGRAM COMPLETE  Result Date: 09/21/2020    ECHOCARDIOGRAM REPORT   Patient Name:   Dominique Hinton Date of Exam: 09/21/2020 Medical Rec #:  [id number]         Height:       66.0 in Accession #:    [id number]        Weight:       200.0 lb Date of Birth:  [birth year]         BSA:          2.000 m Patient Age:    56 years          BP:           145/94 mmHg Patient Gender: F                 HR:           61 bpm. Exam Location:  [facility] Procedure: 2D Echo, Cardiac Doppler and Color Doppler Indications:    NSTEMI I21.4  History:        Patient has no prior history of Echocardiogram examinations.                 Risk Factors:Hypertension and Current Smoker. Elevated troponin,                 Chest Pain.  Sonographer:    Alvino Chapel RCS Referring Phys: [id number] [place]  1. Left ventricular ejection fraction, by estimation, is 55 to 60%. The left ventricle has normal function. The left ventricle has no regional wall motion abnormalities. There is mild left ventricular hypertrophy. Left ventricular diastolic parameters are  indeterminate.  2. Right ventricular systolic function is normal. The right ventricular size is normal. Tricuspid regurgitation signal is inadequate for assessing PA pressure.  3. The mitral  valve is grossly normal. Trivial mitral valve regurgitation.  4. The aortic valve is tricuspid. Aortic valve regurgitation is not visualized.  5. The inferior vena cava is normal in size with greater than 50% respiratory variability, suggesting right atrial pressure of 3 mmHg. FINDINGS  Left Ventricle: Left ventricular ejection fraction, by estimation, is 55 to 60%. The left ventricle has normal function. The left ventricle has no regional wall motion abnormalities. The left ventricular internal cavity size was normal in size. There is  mild left ventricular hypertrophy. Left ventricular diastolic parameters are indeterminate. Right Ventricle: The right ventricular size is normal. No increase in right ventricular wall thickness. Right ventricular systolic function is normal. Tricuspid regurgitation signal is inadequate for assessing PA pressure. Left Atrium: Left atrial size was normal in size. Right Atrium: Right atrial size was normal in size. Pericardium: There is no evidence of pericardial effusion. Mitral Valve: The mitral valve is grossly normal. Trivial mitral valve regurgitation. Tricuspid Valve: The tricuspid valve is grossly normal. Tricuspid valve regurgitation is trivial. Aortic Valve: The aortic valve is tricuspid. Aortic valve regurgitation is not visualized. Pulmonic Valve: The pulmonic valve was grossly normal. Pulmonic valve regurgitation is trivial. Aorta: The aortic root is normal in size and structure. Venous: The inferior vena cava is normal in size with greater than 50% respiratory variability, suggesting right atrial pressure of 3 mmHg. IAS/Shunts: No atrial level shunt detected by color flow Doppler.  LEFT VENTRICLE PLAX 2D LVIDd:         4.70 cm  Diastology LVIDs:         3.20 cm  LV e' medial:    6.74 cm/s LV PW:         1.10 cm  LV E/e' medial:  13.4 LV IVS:        1.20 cm  LV e' lateral:   8.05 cm/s LVOT diam:     2.00 cm  LV E/e' lateral: 11.2 LV SV:         77 LV SV Index:   38 LVOT  Area:     3.14 cm  RIGHT VENTRICLE RV S prime:     12.80 cm/s TAPSE (M-mode): 2.4 cm LEFT ATRIUM             Index       RIGHT ATRIUM           Index LA diam:        3.10 cm 1.55 cm/m  RA Area:     15.50 cm LA Vol (A2C):   56.3 ml 28.15 ml/m RA Volume:   41.50 ml  20.75 ml/m LA Vol (A4C):   35.4 ml 17.70 ml/m LA Biplane Vol: 45.8 ml 22.90 ml/m  AORTIC VALVE LVOT Vmax:   119.00 cm/s LVOT Vmean:  67.900 cm/s LVOT VTI:    0.245 m  AORTA Ao Root diam: 3.40 cm MITRAL VALVE MV Area (PHT): 4.60 cm    SHUNTS MV Decel Time: 165 msec    Systemic VTI:  0.24 m MV E velocity: 90.40 cm/s  Systemic Diam: 2.00 cm MV A velocity: 94.30 cm/s MV E/A ratio:  0.96 Dominique Lesches MD Electronically signed by Dominique Lesches MD Signature Date/Time: 09/21/2020/10:20:57 AM    Final    Disposition   Pt is being discharged home today in good condition.  Follow-up  Plans & Appointments     Follow-up Information    Imogene Burn, PA-C Follow up on 09/29/2020.   Specialty: Cardiology Why: at 2:05pm for your follow up appt Contact information: [place] [address] [phone]              Discharge Instructions    Amb Referral to Cardiac Rehabilitation   Complete by: As directed    Diagnosis:  NSTEMI PTCA     After initial evaluation and assessments completed: Virtual Based Care may be provided alone or in conjunction with Phase 2 Cardiac Rehab based on patient barriers.: Yes   Ambulatory referral to Cardiology   Complete by: As directed    Diet - low sodium heart healthy   Complete by: As directed    Discharge instructions   Complete by: As directed    Radial Site Care Refer to this sheet in the next few weeks. These instructions provide you with information on caring for yourself after your procedure. Your caregiver may also give you more specific instructions. Your treatment has been planned according to current medical practices, but problems sometimes occur. Call your caregiver if  you have any problems or questions after your procedure. HOME CARE INSTRUCTIONS You may shower the day after the procedure.Remove the bandage (dressing) and gently wash the site with plain soap and water.Gently pat the site dry.  Do not apply powder or lotion to the site.  Do not submerge the affected site in water for 3 to 5 days.  Inspect the site at least twice daily.  Do not flex or bend the affected arm for 24 hours.  No lifting over 5 pounds (2.3 kg) for 5 days after your procedure.  Do not drive home if you are discharged the same day of the procedure. Have someone else drive you.  You may drive 24 hours after the procedure unless otherwise instructed by your caregiver.  What to expect: Any bruising will usually fade within 1 to 2 weeks.  Blood that collects in the tissue (hematoma) may be painful to the touch. It should usually decrease in size and tenderness within 1 to 2 weeks.  SEEK IMMEDIATE MEDICAL CARE IF: You have unusual pain at the radial site.  You have redness, warmth, swelling, or pain at the radial site.  You have drainage (other than a small amount of blood on the dressing).  You have chills.  You have a fever or persistent symptoms for more than 72 hours.  You have a fever and your symptoms suddenly get worse.  Your arm becomes pale, cool, tingly, or numb.  You have heavy bleeding from the site. Hold pressure on the site.   PLEASE DO NOT MISS ANY DOSES OF YOUR BRILINTA!!!!! Also keep a log of you blood pressures and bring back to your follow up appt. Please call the office with any questions.   Patients taking blood thinners should generally stay away from medicines like ibuprofen, Advil, Motrin, naproxen, and Aleve due to risk of stomach bleeding. You may take Tylenol as directed or talk to your primary doctor about alternatives.   PLEASE ENSURE THAT YOU DO NOT RUN OUT OF YOUR BRILINTA.This medication is very important to remain on for at least one year. IF you  have issues obtaining this medication due to cost please CALL the office 3-5 business days prior to running out in order to prevent missing doses of this medication.   Increase activity slowly   Complete  by: As directed       Discharge Medications   Allergies as of 09/22/2020      Reactions   Cortisone Shortness Of Breath, Other (See Comments)   Made me feel loopy   Doxycycline Rash      Medication List    TAKE these medications   aspirin 81 MG chewable tablet Chew 1 tablet (81 mg total) by mouth daily.   citalopram 20 MG tablet Commonly known as: CELEXA Take 20 mg by mouth daily.   DULoxetine 60 MG capsule Commonly known as: Cymbalta Take 1 capsule (60 mg total) by mouth daily.   gabapentin 300 MG capsule Commonly known as: NEURONTIN Take 300 mg by mouth 2 (two) times daily.   hydrochlorothiazide 25 MG tablet Commonly known as: HYDRODIURIL Take 25 mg by mouth daily.   HYDROcodone-acetaminophen 10-325 MG tablet Commonly known as: NORCO Take 1 tablet by mouth 4 (four) times daily as needed for pain. Only at night prn   losartan 100 MG tablet Commonly known as: COZAAR Take 100 mg by mouth daily.   meloxicam 15 MG tablet Commonly known as: MOBIC Take 15 mg by mouth daily.   nitroGLYCERIN 0.4 MG SL tablet Commonly known as: NITROSTAT Place 1 tablet (0.4 mg total) under the tongue every 5 (five) minutes as needed for chest pain.   pantoprazole 40 MG tablet Commonly known as: PROTONIX Take 1 tablet (40 mg total) by mouth daily.   rosuvastatin 20 MG tablet Commonly known as: CRESTOR Take 1 tablet (20 mg total) by mouth daily at 6 PM.   ticagrelor 90 MG Tabs tablet Commonly known as: BRILINTA Take 1 tablet (90 mg total) by mouth 2 (two) times daily.   tiZANidine 4 MG tablet Commonly known as: ZANAFLEX Take 4-8 mg by mouth 3 (three) times daily as needed for muscle spasms.   valACYclovir 1000 MG tablet Commonly known as: VALTREX Take 1,000 mg by mouth  daily.       Outstanding Labs/Studies   FLP/LFTs in 8 weeks  Duration of Discharge Encounter   Greater than 30 minutes including physician time.  Signed, Reino Bellis, NP 09/22/2020, 12:13 PM    Patient seen and examined. Agree with assessment and plan.  Patient feels well without recurrent chest pain or shortness of breath.  I had a long discussion with her regarding her coronary anatomy, smoking cessation, optimal strategy with target LDL in the 60s or below as well as the importance of heart healthy Mediterranean diet as well as exercise.  Right radial site is stable.  Echo Doppler data shows preserved LV function without regional wall motion abnormalities.  Discharge today with plans for follow-up in the [place] office.   Troy Sine, MD, [facility] 09/22/2020 12:13 PM

## 2020-09-22 NOTE — Plan of Care (Signed)
Patient has met all outcomes positively. Education done at bedside by myself and cardiac rehab. Patient denies any questions at the time of discharge.

## 2020-09-24 ENCOUNTER — Telehealth (HOSPITAL_COMMUNITY): Payer: Self-pay | Admitting: Physical Therapy

## 2020-09-24 ENCOUNTER — Ambulatory Visit (HOSPITAL_COMMUNITY): Payer: Medicaid Other | Admitting: Physical Therapy

## 2020-09-24 NOTE — Telephone Encounter (Signed)
pt cancelled appt for today because she has a fever

## 2020-09-28 ENCOUNTER — Ambulatory Visit (HOSPITAL_COMMUNITY): Payer: Medicaid Other | Admitting: Physical Therapy

## 2020-09-28 NOTE — Progress Notes (Unsigned)
Cardiology Office Note    Date:  09/29/2020   ID:  Dominique Hinton, DOB [birth year], MRN [id number]  PCP:  Asencion Noble, MD  Cardiologist: Rozann Lesches, MD EPS: None  Chief Complaint  Patient presents with  . Hospitalization Follow-up    History of Present Illness:  Dominique Hinton is a 50 y.o. female with history of hypertension and tobacco abuse presented with NSTEMI and found to have a totally occluded diagonal 1 and underwent balloon angioplasty, post intervention with 25% residual stenosis but vessel too small for stenting.  Placed on aspirin and Plavix.  Works at [employer].  Patient says chest hurts when she gets agitated and it goes away. Last 10 min and eases with deep breathing. doesn't radiate or feel like when she had her MI. Very dizzy when she first came home but starting to get better. Drinks a lot of mountain dew. Smoking 1/2 ppd, smokes marijuana but has cut back. Has a lot of anxiety and has a lot of stress at home.  BP running low at home-91/70. Did orthostatics when first got home 124/93 lying, 123/96 sitting, 95/64. Dizziness.     Past Medical History:  Diagnosis Date  . Anxiety   . Arthritis   . Cervical pain   . Family history of adverse reaction to anesthesia    " My Mother has a hard time waking up "  . Hypertension     Past Surgical History:  Procedure Laterality Date  . ABDOMINAL HYSTERECTOMY    . APPENDECTOMY    . BREAST SURGERY    . CARDIAC CATHETERIZATION  09/21/2020  . CHOLECYSTECTOMY    . CORONARY BALLOON ANGIOPLASTY  09/21/2020  . CORONARY/GRAFT ACUTE MI REVASCULARIZATION N/A 09/21/2020   Procedure: Coronary/Graft Acute MI Revascularization;  Surgeon: Jettie Booze, MD;  Location: [facility];  Service: Cardiovascular;  Laterality: N/A;  . LEFT HEART CATH AND CORONARY ANGIOGRAPHY N/A 09/21/2020   Procedure: LEFT HEART CATH AND CORONARY ANGIOGRAPHY;  Surgeon: Jettie Booze, MD;  Location: [facility];   Service: Cardiovascular;  Laterality: N/A;  . TUBAL LIGATION      Current Medications: Current Meds  Medication Sig  . aspirin 81 MG chewable tablet Chew 1 tablet (81 mg total) by mouth daily.  . citalopram (CELEXA) 20 MG tablet Take 20 mg by mouth daily.  . DULoxetine (CYMBALTA) 60 MG capsule Take 1 capsule (60 mg total) by mouth daily.  Marland Kitchen gabapentin (NEURONTIN) 300 MG capsule Take 300 mg by mouth 2 (two) times daily.  Marland Kitchen HYDROcodone-acetaminophen (NORCO) 10-325 MG tablet Take 1 tablet by mouth 4 (four) times daily as needed for pain. Only at night prn  . losartan (COZAAR) 100 MG tablet Take 100 mg by mouth daily.  . meloxicam (MOBIC) 15 MG tablet Take 15 mg by mouth daily.  . Nicotine 21-14-7 MG/24HR KIT Place 1 patch onto the skin as directed.  . nitroGLYCERIN (NITROSTAT) 0.4 MG SL tablet Place 1 tablet (0.4 mg total) under the tongue every 5 (five) minutes as needed for chest pain.  . pantoprazole (PROTONIX) 40 MG tablet Take 1 tablet (40 mg total) by mouth daily.  . rosuvastatin (CRESTOR) 20 MG tablet Take 1 tablet (20 mg total) by mouth daily at 6 PM.  . ticagrelor (BRILINTA) 90 MG TABS tablet Take 1 tablet (90 mg total) by mouth 2 (two) times daily.  Marland Kitchen tiZANidine (ZANAFLEX) 4 MG tablet Take 4-8 mg by mouth 3 (three) times daily as  needed for muscle spasms.  . valACYclovir (VALTREX) 1000 MG tablet Take 1,000 mg by mouth daily.  . [DISCONTINUED] hydrochlorothiazide (HYDRODIURIL) 25 MG tablet Take 25 mg by mouth daily.     Allergies:   Cortisone and Doxycycline   Social History   Socioeconomic History  . Marital status: Legally Separated    Spouse name: Not on file  . Number of children: 3  . Years of education: 9  . Highest education level: Not on file  Occupational History  . Occupation: waitress/cook  Tobacco Use  . Smoking status: Current Every Day Smoker    Packs/day: 1.00    Types: Cigarettes  . Smokeless tobacco: Never Used  Vaping Use  . Vaping Use: Never used   Substance and Sexual Activity  . Alcohol use: Yes    Comment: occasionally  . Drug use: Yes    Types: Marijuana    Comment: occasionally  . Sexual activity: Yes    Birth control/protection: None  Other Topics Concern  . Not on file  Social History Narrative   Lives at home with daughter, Dominique Hinton and fiance.   10-12 cups caffeine per day.   Right-handed.   Social Determinants of Health   Financial Resource Strain: Not on file  Food Insecurity: Not on file  Transportation Needs: Not on file  Physical Activity: Not on file  Stress: Not on file  Social Connections: Not on file     Family History:  The patient's family history includes Alzheimer's disease in her father; Hypertension in an other family member; Other in her mother.   ROS:   Please see the history of present illness.    ROS All other systems reviewed and are negative.   PHYSICAL EXAM:   VS:  BP 122/82   Pulse 87   Ht $R'5\' 6"'wR$  (1.676 m)   Wt 185 lb (83.9 kg)   SpO2 98%   BMI 29.86 kg/m   Physical Exam  GEN: Well nourished, well developed, in no acute distress  Neck: no JVD, carotid bruits, or masses Cardiac:RRR; no murmurs, rubs, or gallops  Respiratory:  clear to auscultation bilaterally, normal work of breathing GI: soft, nontender, nondistended, + BS Ext: Right arm at cath site without hematoma or hemorrhage at cath site, good radial brachial pulses, lower extremities without cyanosis, clubbing, or edema, Good distal pulses bilaterally Neuro:  Alert and Oriented x 3 Psych: anxious  Wt Readings from Last 3 Encounters:  09/29/20 185 lb (83.9 kg)  09/22/20 187 lb 9.6 oz (85.1 kg)  02/24/20 190 lb (86.2 kg)      Studies/Labs Reviewed:   EKG:  EKG is not ordered today.   Recent Labs: 09/21/2020: ALT 54; Magnesium 2.0 09/22/2020: BUN 7; Creatinine, Ser 0.79; Hemoglobin 14.9; Platelets 261; Potassium 3.7; Sodium 136   Lipid Panel    Component Value Date/Time   CHOL 185 09/22/2020 0301   TRIG 179  (H) 09/22/2020 0301   HDL 24 (L) 09/22/2020 0301   CHOLHDL 7.7 09/22/2020 0301   VLDL 36 09/22/2020 0301   LDLCALC 125 (H) 09/22/2020 0301    Additional studies/ records that were reviewed today include:  Cath: 09/21/20    1st Mrg lesion is 75% stenosed. This is a very small vessel.  Lat 1st Diag lesion is 100% stenosed. Given the wall motion abnormality on ventriculogram, we decided to intervene.  Balloon angioplasty was performed using a BALLOON SAPPHIRE 1.25X10. It turned out to be a small vessel.  Post intervention, there is  a 25% residual stenosis. The vessel was too small for stenting.  The left ventricular systolic function is normal.  LV end diastolic pressure is normal.  The left ventricular ejection fraction is 50-55% by visual estimate.  There is no aortic valve stenosis.   Continue aggressive medical therapy with DAPT.  Could change Brilinta to Plavix since no stent was placed.    She needs to stop smoking.  Healthy diet, BP and blood sugar control would also be helpful.       Risk Assessment/Calculations:         ASSESSMENT:    1. Coronary artery disease involving native coronary artery of native heart without angina pectoris   2. Essential hypertension   3. Other hyperlipidemia   4. Tobacco abuse   5. Anxiety      PLAN:  In order of problems listed above: CAD status post NSTEMI treated with balloon angioplasty to diagonal 1 2 small for stenting.  On DAPT with Brilinta and aspirin. Some chest pain when anxious but not with activity and different from MI pain. Can go ahead and start PT for back.  Recommend lifestyle modifications including dietary changes, smoking cessation, exercise and decrease stress and anxiety.  Follow-up in 2 months.  Hypertension BP running low at home. Will try to decrease HCTZ 25 mg 1/2 tablet daily.  Patient will keep track of her blood pressure at home.  Hyperlipidemia on crestor 20 mg daily will need fasting lipid panel  in 3 months  Tobacco abuse  Has cut back.  Smoking cessation discussed.  Will prescribe nicotine patches.  Chronic back pain on Mobic need to monitor closely for occult bleeding with need for DAPT-to discuss with Dr. Willey Blade  Anxiety-will refer for counseling here at behavioral health   Shared Decision Making/Informed Consent        Medication Adjustments/Labs and Tests Ordered: Current medicines are reviewed at length with the patient today.  Concerns regarding medicines are outlined above.  Medication changes, Labs and Tests ordered today are listed in the Patient Instructions below. Patient Instructions   Medication Instructions:  Your physician has recommended you make the following change in your medication:   DECREASE: HCTZ to 12.$RemoveBe'5mg'KNbOsTEdT$  daily START: Nicotine Patch as prescribed  *If you need a refill on your cardiac medications before your next appointment, please call your pharmacy*   Lab Work: None If you have labs (blood work) drawn today and your tests are completely normal, you will receive your results only by: Marland Kitchen MyChart Message (if you have MyChart) OR . A paper copy in the mail If you have any lab test that is abnormal or we need to change your treatment, we will call you to review the results.   Follow-Up: At [facility], you and your health needs are our priority.  As part of our continuing mission to provide you with exceptional heart care, we have created designated Provider Care Teams.  These Care Teams include your primary Cardiologist (physician) and Advanced Practice Providers (APPs -  Physician Assistants and Nurse Practitioners) who all work together to provide you with the care you need, when you need it.  We recommend signing up for the patient portal called "MyChart".  Sign up information is provided on this After Visit Summary.  MyChart is used to connect with patients for Virtual Visits (Telemedicine).  Patients are able to view lab/test results,  encounter notes, upcoming appointments, etc.  Non-urgent messages can be sent to your provider as well.  To learn more about what you can do with MyChart, go to [email or web address].    Your next appointment:   2 month(s)  The format for your next appointment:   In Person  Provider:   You may see Rozann Lesches, MD or one of the following Advanced Practice Providers on your designated Care Team:    Bernerd Pho, PA-C   Ermalinda Barrios, PA-C     Other Instructions  **PT IS CLEARED TO START PHYSICAL THERAPY**  Heart-Healthy Eating Plan Heart-healthy meal planning includes:  Eating less unhealthy fats.  Eating more healthy fats.  Making other changes in your diet. Talk with your doctor or a diet specialist (dietitian) to create an eating plan that is right for you. Cooking Avoid frying your food. Try to bake, boil, grill, or broil it instead. You can also reduce fat by:  Removing the skin from poultry.  Removing all visible fats from meats.  Steaming vegetables in water or broth. Meal planning  At meals, divide your plate into four equal parts: ? Fill one-half of your plate with vegetables and green salads. ? Fill one-fourth of your plate with whole grains. ? Fill one-fourth of your plate with lean protein foods.  Eat 4-5 servings of vegetables per day. A serving of vegetables is: ? 1 cup of raw or cooked vegetables. ? 2 cups of raw leafy greens.  Eat 4-5 servings of fruit per day. A serving of fruit is: ? 1 medium whole fruit. ?  cup of dried fruit. ?  cup of fresh, frozen, or canned fruit. ?  cup of 100% fruit juice.  Eat more foods that have soluble fiber. These are apples, broccoli, carrots, beans, peas, and barley. Try to get 20-30 g of fiber per day.  Eat 4-5 servings of nuts, legumes, and seeds per week: ? 1 serving of dried beans or legumes equals  cup after being cooked. ? 1 serving of nuts is  cup. ? 1 serving of seeds equals 1  tablespoon.   General information  Eat more home-cooked food. Eat less restaurant, buffet, and fast food.  Limit or avoid alcohol.  Limit foods that are high in starch and sugar.  Avoid fried foods.  Lose weight if you are overweight.  Keep track of how much salt (sodium) you eat. This is important if you have high blood pressure. Ask your doctor to tell you more about this.  Try to add vegetarian meals each week. Fats  Choose healthy fats. These include olive oil and canola oil, flaxseeds, walnuts, almonds, and seeds.  Eat more omega-3 fats. These include salmon, mackerel, sardines, tuna, flaxseed oil, and ground flaxseeds. Try to eat fish at least 2 times each week.  Check food labels. Avoid foods with trans fats or high amounts of saturated fat.  Limit saturated fats. ? These are often found in animal products, such as meats, butter, and cream. ? These are also found in plant foods, such as palm oil, palm kernel oil, and coconut oil.  Avoid foods with partially hydrogenated oils in them. These have trans fats. Examples are stick margarine, some tub margarines, cookies, crackers, and other baked goods. What foods can I eat? Fruits All fresh, canned (in natural juice), or frozen fruits. Vegetables Fresh or frozen vegetables (raw, steamed, roasted, or grilled). Green salads. Grains Most grains. Choose whole wheat and whole grains most of the time. Rice and pasta, including brown rice and pastas made with whole wheat. Meats and other proteins Lean, well-trimmed  beef, veal, pork, and lamb. Chicken and [place] without skin. All fish and shellfish. Wild duck, rabbit, pheasant, and venison. Egg whites or low-cholesterol egg substitutes. Dried beans, peas, lentils, and tofu. Seeds and most nuts. Dairy Low-fat or nonfat cheeses, including ricotta and mozzarella. Skim or 1% milk that is liquid, powdered, or evaporated. Buttermilk that is made with low-fat milk. Nonfat or low-fat  yogurt. Fats and oils Non-hydrogenated (trans-free) margarines. Vegetable oils, including soybean, sesame, sunflower, olive, peanut, safflower, corn, canola, and cottonseed. Salad dressings or mayonnaise made with a vegetable oil. Beverages Mineral water. Coffee and tea. Diet carbonated beverages. Sweets and desserts Sherbet, gelatin, and fruit ice. Small amounts of dark chocolate. Limit all sweets and desserts. Seasonings and condiments All seasonings and condiments. The items listed above may not be a complete list of foods and drinks you can eat. Contact a dietitian for more options. What foods should I avoid? Fruits Canned fruit in heavy syrup. Fruit in cream or butter sauce. Fried fruit. Limit coconut. Vegetables Vegetables cooked in cheese, cream, or butter sauce. Fried vegetables. Grains Breads that are made with saturated or trans fats, oils, or whole milk. Croissants. Sweet rolls. Donuts. High-fat crackers, such as cheese crackers. Meats and other proteins Fatty meats, such as hot dogs, ribs, sausage, bacon, rib-eye roast or steak. High-fat deli meats, such as salami and bologna. Caviar. Domestic duck and goose. Organ meats, such as liver. Dairy Cream, sour cream, cream cheese, and creamed cottage cheese. Whole-milk cheeses. Whole or 2% milk that is liquid, evaporated, or condensed. Whole buttermilk. Cream sauce or high-fat cheese sauce. Yogurt that is made from whole milk. Fats and oils Meat fat, or shortening. Cocoa butter, hydrogenated oils, palm oil, coconut oil, palm kernel oil. Solid fats and shortenings, including bacon fat, salt pork, lard, and butter. Nondairy cream substitutes. Salad dressings with cheese or sour cream. Beverages Regular sodas and juice drinks with added sugar. Sweets and desserts Frosting. Pudding. Cookies. Cakes. Pies. Milk chocolate or white chocolate. Buttered syrups. Full-fat ice cream or ice cream drinks. The items listed above may not be a  complete list of foods and drinks to avoid. Contact a dietitian for more information. Summary  Heart-healthy meal planning includes eating less unhealthy fats, eating more healthy fats, and making other changes in your diet.  Eat a balanced diet. This includes fruits and vegetables, low-fat or nonfat dairy, lean protein, nuts and legumes, whole grains, and heart-healthy oils and fats. This information is not intended to replace advice given to you by your health care provider. Make sure you discuss any questions you have with your health care provider. Document Revised: 10/25/2017 Document Reviewed: 09/28/2017 Elsevier Patient Education  2021 [place].  Two Gram Sodium Diet 2000 mg  What is Sodium? Sodium is a mineral found naturally in many foods. The most significant source of sodium in the diet is table salt, which is about 40% sodium.  Processed, convenience, and preserved foods also contain a large amount of sodium.  The body needs only 500 mg of sodium daily to function,  A normal diet provides more than enough sodium even if you do not use salt.  Why Limit Sodium? A build up of sodium in the body can cause thirst, increased blood pressure, shortness of breath, and water retention.  Decreasing sodium in the diet can reduce edema and risk of heart attack or stroke associated with high blood pressure.  Keep in mind that there are many other factors involved in these health  problems.  Heredity, obesity, lack of exercise, cigarette smoking, stress and what you eat all play a role.  General Guidelines:  Do not add salt at the table or in cooking.  One teaspoon of salt contains over 2 grams of sodium.  Read food labels  Avoid processed and convenience foods  Ask your dietitian before eating any foods not dicussed in the menu planning guidelines  Consult your physician if you wish to use a salt substitute or a sodium containing medication such as antacids.  Limit milk and milk products  to 16 oz (2 cups) per day.  Shopping Hints:  READ LABELS!! "Dietetic" does not necessarily mean low sodium.  Salt and other sodium ingredients are often added to foods during processing.   Menu Planning Guidelines Food Group Choose More Often Avoid  Beverages (see also the milk group All fruit juices, low-sodium, salt-free vegetables juices, low-sodium carbonated beverages Regular vegetable or tomato juices, commercially softened water used for drinking or cooking  Breads and Cereals Enriched white, wheat, rye and pumpernickel bread, hard rolls and dinner rolls; muffins, cornbread and waffles; most dry cereals, cooked cereal without added salt; unsalted crackers and breadsticks; low sodium or homemade bread crumbs Bread, rolls and crackers with salted tops; quick breads; instant hot cereals; pancakes; commercial bread stuffing; self-rising flower and biscuit mixes; regular bread crumbs or cracker crumbs  Desserts and Sweets Desserts and sweets mad with mild should be within allowance Instant pudding mixes and cake mixes  Fats Butter or margarine; vegetable oils; unsalted salad dressings, regular salad dressings limited to 1 Tbs; light, sour and heavy cream Regular salad dressings containing bacon fat, bacon bits, and salt pork; snack dips made with instant soup mixes or processed cheese; salted nuts  Fruits Most fresh, frozen and canned fruits Fruits processed with salt or sodium-containing ingredient (some dried fruits are processed with sodium sulfites        Vegetables Fresh, frozen vegetables and low- sodium canned vegetables Regular canned vegetables, sauerkraut, pickled vegetables, and others prepared in brine; frozen vegetables in sauces; vegetables seasoned with ham, bacon or salt pork  Condiments, Sauces, Miscellaneous  Salt substitute with physician's approval; pepper, herbs, spices; vinegar, lemon or lime juice; hot pepper sauce; garlic powder, onion powder, low sodium soy sauce (1  Tbs.); low sodium condiments (ketchup, chili sauce, mustard) in limited amounts (1 tsp.) fresh ground horseradish; unsalted tortilla chips, pretzels, potato chips, popcorn, salsa (1/4 cup) Any seasoning made with salt including garlic salt, celery salt, onion salt, and seasoned salt; sea salt, rock salt, kosher salt; meat tenderizers; monosodium glutamate; mustard, regular soy sauce, barbecue, sauce, chili sauce, teriyaki sauce, steak sauce, Worcestershire sauce, and most flavored vinegars; canned gravy and mixes; regular condiments; salted snack foods, olives, picles, relish, horseradish sauce, catsup   Food preparation: Try these seasonings Meats:    Pork Sage, onion Serve with applesauce  Chicken Poultry seasoning, thyme, parsley Serve with cranberry sauce  Lamb Curry powder, rosemary, garlic, thyme Serve with mint sauce or jelly  Veal Marjoram, basil Serve with current jelly, cranberry sauce  Beef Pepper, bay leaf Serve with dry mustard, unsalted chive butter  Fish [place] leaf, dill Serve with unsalted lemon butter, unsalted parsley butter  Vegetables:    Asparagus Lemon juice   Broccoli Lemon juice   Carrots Mustard dressing parsley, mint, nutmeg, glazed with unsalted butter and sugar   Green beans Marjoram, lemon juice, nutmeg,dill seed   Tomatoes Basil, marjoram, onion   Spice /blend for [employer]  4 tsp ground thyme 1 tsp ground sage 3 tsp ground rosemary 4 tsp ground marjoram   Test your knowledge 1. A product that says "Salt Free" may still contain sodium. True or False 2. Garlic Powder and Hot Pepper Sauce an be used as alternative seasonings.True or False 3. Processed foods have more sodium than fresh foods.  True or False 4. Canned Vegetables have less sodium than froze True or False  WAYS TO DECREASE YOUR SODIUM INTAKE 1. Avoid the use of added salt in cooking and at the table.  Table salt (and other prepared seasonings which contain salt) is probably one of the greatest  sources of sodium in the diet.  Unsalted foods can gain flavor from the sweet, sour, and butter taste sensations of herbs and spices.  Instead of using salt for seasoning, try the following seasonings with the foods listed.  Remember: how you use them to enhance natural food flavors is limited only by your creativity... Allspice-Meat, fish, eggs, fruit, peas, red and yellow vegetables Almond Extract-Fruit baked goods Anise Seed-Sweet breads, fruit, carrots, beets, cottage cheese, cookies (tastes like licorice) Basil-Meat, fish, eggs, vegetables, rice, vegetables salads, soups, sauces Bay Leaf-Meat, fish, stews, poultry Burnet-Salad, vegetables (cucumber-like flavor) Caraway Seed-Bread, cookies, cottage cheese, meat, vegetables, cheese, rice Cardamon-Baked goods, fruit, soups Celery Powder or seed-Salads, salad dressings, sauces, meatloaf, soup, bread.Do not use  celery salt Chervil-Meats, salads, fish, eggs, vegetables, cottage cheese (parsley-like flavor) Chili Power-Meatloaf, chicken cheese, corn, eggplant, egg dishes Chives-Salads cottage cheese, egg dishes, soups, vegetables, sauces Cilantro-Salsa, casseroles Cinnamon-Baked goods, fruit, pork, lamb, chicken, carrots Cloves-Fruit, baked goods, fish, pot roast, green beans, beets, carrots Coriander-Pastry, cookies, meat, salads, cheese (lemon-orange flavor) Cumin-Meatloaf, fish,cheese, eggs, cabbage,fruit pie (caraway flavor) [employer], fruit, eggs, fish, poultry, cottage cheese, vegetables Dill Seed-Meat, cottage cheese, poultry, vegetables, fish, salads, bread Fennel Seed-Bread, cookies, apples, pork, eggs, fish, beets, cabbage, cheese, Licorice-like flavor Garlic-(buds or powder) Salads, meat, poultry, fish, bread, butter, vegetables, potatoes.Do not  use garlic salt Ginger-Fruit, vegetables, baked goods, meat, fish, poultry Horseradish Root-Meet, vegetables, butter Lemon Juice or Extract-Vegetables, fruit, tea, baked goods, fish  salads Mace-Baked goods fruit, vegetables, fish, poultry (taste like nutmeg) Maple Extract-Syrups Marjoram-Meat, chicken, fish, vegetables, breads, green salads (taste like Sage) Mint-Tea, lamb, sherbet, vegetables, desserts, carrots, cabbage Mustard, Dry or Seed-Cheese, eggs, meats, vegetables, poultry Nutmeg-Baked goods, fruit, chicken, eggs, vegetables, desserts Onion Powder-Meat, fish, poultry, vegetables, cheese, eggs, bread, rice salads (Do not use   Onion salt) Orange Extract-Desserts, baked goods Oregano-Pasta, eggs, cheese, onions, pork, lamb, fish, chicken, vegetables, green salads Paprika-Meat, fish, poultry, eggs, cheese, vegetables Parsley Flakes-Butter, vegetables, meat fish, poultry, eggs, bread, salads (certain forms may   Contain sodium Pepper-Meat fish, poultry, vegetables, eggs Peppermint Extract-Desserts, baked goods Poppy Seed-Eggs, bread, cheese, fruit dressings, baked goods, noodles, vegetables, cottage  [employer], poultry, meat, fish, cauliflower, turnips,eggs bread Saffron-Rice, bread, veal, chicken, fish, eggs Sage-Meat, fish, poultry, onions, eggplant, tomateos, pork, stews Savory-Eggs, salads, poultry, meat, rice, vegetables, soups, pork Tarragon-Meat, poultry, fish, eggs, butter, vegetables (licorice-like flavor)  Thyme-Meat, poultry, fish, eggs, vegetables, (clover-like flavor), sauces, soups Tumeric-Salads, butter, eggs, fish, rice, vegetables (saffron-like flavor) Vanilla Extract-Baked goods, candy Vinegar-Salads, vegetables, meat marinades Walnut Extract-baked goods, candy  2. Choose your Foods Wisely   The following is a list of foods to avoid which are high in sodium:  Meats-Avoid all smoked, canned, salt cured, dried and kosher meat and fish as well as Anchovies   [employer] Rx meats:Bologna,  Liverwurst, Pastrami Canned meat or fish  Marinated herring Caviar    Pepperoni Corned  Beef   Pizza Dried chipped beef  Salami Frozen breaded fish or meat Salt pork Frankfurters or hot dogs  Sardines Gefilte fish   Sausage Ham (boiled ham, Proscuitto Smoked butt    spiced ham)   Spam      TV Dinners Vegetables Canned vegetables (Regular) Relish Canned mushrooms  Sauerkraut Olives    Tomato juice Pickles  Bakery and Dessert Products Canned puddings  Cream pies Cheesecake   Decorated cakes Cookies  Beverages/Juices Tomato juice, regular  Gatorade   V-8 vegetable juice, regular  Breads and Cereals Biscuit mixes   Salted potato chips, corn chips, pretzels Bread stuffing mixes  Salted crackers and rolls Pancake and waffle mixes Self-rising flour  Seasonings Accent    Meat sauces Barbecue sauce  Meat tenderizer Catsup    Monosodium glutamate (MSG) Celery salt   Onion salt Chili sauce   Prepared mustard Garlic salt   Salt, seasoned salt, sea salt Gravy mixes   Soy sauce Horseradish   Steak sauce Ketchup   Tartar sauce Lite salt    Teriyaki sauce Marinade mixes   Worcestershire sauce  Others Baking powder   Cocoa and cocoa mixes Baking soda   Commercial casserole mixes Candy-caramels, chocolate  Dehydrated soups    Bars, fudge,nougats  Instant rice and pasta mixes Canned broth or soup  Maraschino cherries Cheese, aged and processed cheese and cheese spreads  Learning Assessment Quiz  Indicated T (for True) or F (for False) for each of the following statements:  1. _____ Fresh fruits and vegetables and unprocessed grains are generally low in sodium 2. _____ Water may contain a considerable amount of sodium, depending on the source 3. _____ You can always tell if a food is high in sodium by tasting it 4. _____ Certain laxatives my be high in sodium and should be avoided unless prescribed   by a physician or pharmacist 5. _____ Salt substitutes may be used freely by anyone on a sodium restricted diet 6. _____ Sodium is present in table salt, food  additives and as a natural component of   most foods 7. _____ Table salt is approximately 90% sodium 8. _____ Limiting sodium intake may help prevent excess fluid accumulation in the body 9. _____ On a sodium-restricted diet, seasonings such as bouillon soy sauce, and    cooking wine should be used in place of table salt 10. _____ On an ingredient list, a product which lists monosodium glutamate as the first   ingredient is an appropriate food to include on a low sodium diet  Circle the best answer(s) to the following statements (Hint: there may be more than one correct answer)  11. On a low-sodium diet, some acceptable snack items are:    A. Olives  F. Bean dip   K. Grapefruit juice    B. Salted Pretzels G. Commercial Popcorn   L. Canned peaches    C. Carrot Sticks  H. Bouillon   M. Unsalted nuts   D. [place] fries  I. Peanut butter crackers N. Salami   E. Sweet pickles J. Tomato Juice   O. Pizza  12.  Seasonings that may be used freely on a reduced - sodium diet include   A. Lemon wedges F.Monosodium glutamate K. Celery seed    B.Soysauce   G. Pepper   L. Mustard powder   C. Sea salt  H. Cooking wine  M. Onion  flakes   D. Vinegar  E. Prepared horseradish N. Salsa   E. Sage   J. Worcestershire sauce  O. [address]       Sumner Boast, PA-C  09/29/2020 2:39 PM    [facility] [address], [place], [place]  [address] Phone: [phone]; Fax: [phone]

## 2020-09-29 ENCOUNTER — Other Ambulatory Visit: Payer: Self-pay

## 2020-09-29 ENCOUNTER — Ambulatory Visit (INDEPENDENT_AMBULATORY_CARE_PROVIDER_SITE_OTHER): Payer: Medicaid Other | Admitting: Physician Assistant

## 2020-09-29 ENCOUNTER — Encounter: Payer: Self-pay | Admitting: Physician Assistant

## 2020-09-29 VITALS — BP 122/82 | HR 87 | Ht 66.0 in | Wt 185.0 lb

## 2020-09-29 DIAGNOSIS — F419 Anxiety disorder, unspecified: Secondary | ICD-10-CM | POA: Diagnosis not present

## 2020-09-29 DIAGNOSIS — I10 Essential (primary) hypertension: Secondary | ICD-10-CM | POA: Diagnosis not present

## 2020-09-29 DIAGNOSIS — E7849 Other hyperlipidemia: Secondary | ICD-10-CM

## 2020-09-29 DIAGNOSIS — Z720 Tobacco use: Secondary | ICD-10-CM

## 2020-09-29 DIAGNOSIS — I2510 Atherosclerotic heart disease of native coronary artery without angina pectoris: Secondary | ICD-10-CM | POA: Diagnosis not present

## 2020-09-29 MED ORDER — NICOTINE 21-14-7 MG/24HR TD KIT: 1.0000 | PACK | TRANSDERMAL | 0 refills | Status: DC

## 2020-09-29 MED ORDER — NICOTINE 21 MG/24HR TD PT24: 21.0000 mg | MEDICATED_PATCH | Freq: Every day | TRANSDERMAL | 0 refills | Status: AC

## 2020-09-29 MED ORDER — NICOTINE 7 MG/24HR TD PT24: 7.0000 mg | MEDICATED_PATCH | Freq: Every day | TRANSDERMAL | 0 refills | Status: DC

## 2020-09-29 MED ORDER — NICOTINE 14 MG/24HR TD PT24: 14.0000 mg | MEDICATED_PATCH | Freq: Every day | TRANSDERMAL | 0 refills | Status: DC

## 2020-09-29 MED ORDER — HYDROCHLOROTHIAZIDE 25 MG PO TABS: 12.5000 mg | ORAL_TABLET | Freq: Every day | ORAL | 3 refills | Status: DC

## 2020-09-29 NOTE — Patient Instructions (Signed)
Medication Instructions:  Your physician has recommended you make the following change in your medication:   DECREASE: HCTZ to 12.5mg  daily START: Nicotine Patch as prescribed  *If you need a refill on your cardiac medications before your next appointment, please call your pharmacy*   Lab Work: None If you have labs (blood work) drawn today and your tests are completely normal, you will receive your results only by: Marland Kitchen MyChart Message (if you have MyChart) OR . A paper copy in the mail If you have any lab test that is abnormal or we need to change your treatment, we will call you to review the results.   Follow-Up: At [facility], you and your health needs are our priority.  As part of our continuing mission to provide you with exceptional heart care, we have created designated Provider Care Teams.  These Care Teams include your primary Cardiologist (physician) and Advanced Practice Providers (APPs -  Physician Assistants and Nurse Practitioners) who all work together to provide you with the care you need, when you need it.  We recommend signing up for the patient portal called "MyChart".  Sign up information is provided on this After Visit Summary.  MyChart is used to connect with patients for Virtual Visits (Telemedicine).  Patients are able to view lab/test results, encounter notes, upcoming appointments, etc.  Non-urgent messages can be sent to your provider as well.   To learn more about what you can do with MyChart, go to [email or web address].    Your next appointment:   2 month(s)  The format for your next appointment:   In Person  Provider:   You may see Rozann Lesches, MD or one of the following Advanced Practice Providers on your designated Care Team:    Bernerd Pho, PA-C   Ermalinda Barrios, PA-C     Other Instructions  **PT IS CLEARED TO START PHYSICAL THERAPY**  Heart-Healthy Eating Plan Heart-healthy meal planning includes:  Eating less unhealthy  fats.  Eating more healthy fats.  Making other changes in your diet. Talk with your doctor or a diet specialist (dietitian) to create an eating plan that is right for you. Cooking Avoid frying your food. Try to bake, boil, grill, or broil it instead. You can also reduce fat by:  Removing the skin from poultry.  Removing all visible fats from meats.  Steaming vegetables in water or broth. Meal planning  At meals, divide your plate into four equal parts: ? Fill one-half of your plate with vegetables and green salads. ? Fill one-fourth of your plate with whole grains. ? Fill one-fourth of your plate with lean protein foods.  Eat 4-5 servings of vegetables per day. A serving of vegetables is: ? 1 cup of raw or cooked vegetables. ? 2 cups of raw leafy greens.  Eat 4-5 servings of fruit per day. A serving of fruit is: ? 1 medium whole fruit. ?  cup of dried fruit. ?  cup of fresh, frozen, or canned fruit. ?  cup of 100% fruit juice.  Eat more foods that have soluble fiber. These are apples, broccoli, carrots, beans, peas, and barley. Try to get 20-30 g of fiber per day.  Eat 4-5 servings of nuts, legumes, and seeds per week: ? 1 serving of dried beans or legumes equals  cup after being cooked. ? 1 serving of nuts is  cup. ? 1 serving of seeds equals 1 tablespoon.   General information  Eat more home-cooked food. Eat less restaurant, buffet, and  fast food.  Limit or avoid alcohol.  Limit foods that are high in starch and sugar.  Avoid fried foods.  Lose weight if you are overweight.  Keep track of how much salt (sodium) you eat. This is important if you have high blood pressure. Ask your doctor to tell you more about this.  Try to add vegetarian meals each week. Fats  Choose healthy fats. These include olive oil and canola oil, flaxseeds, walnuts, almonds, and seeds.  Eat more omega-3 fats. These include salmon, mackerel, sardines, tuna, flaxseed oil, and ground  flaxseeds. Try to eat fish at least 2 times each week.  Check food labels. Avoid foods with trans fats or high amounts of saturated fat.  Limit saturated fats. ? These are often found in animal products, such as meats, butter, and cream. ? These are also found in plant foods, such as palm oil, palm kernel oil, and coconut oil.  Avoid foods with partially hydrogenated oils in them. These have trans fats. Examples are stick margarine, some tub margarines, cookies, crackers, and other baked goods. What foods can I eat? Fruits All fresh, canned (in natural juice), or frozen fruits. Vegetables Fresh or frozen vegetables (raw, steamed, roasted, or grilled). Green salads. Grains Most grains. Choose whole wheat and whole grains most of the time. Rice and pasta, including brown rice and pastas made with whole wheat. Meats and other proteins Lean, well-trimmed beef, veal, pork, and lamb. Chicken and [place] without skin. All fish and shellfish. Wild duck, rabbit, pheasant, and venison. Egg whites or low-cholesterol egg substitutes. Dried beans, peas, lentils, and tofu. Seeds and most nuts. Dairy Low-fat or nonfat cheeses, including ricotta and mozzarella. Skim or 1% milk that is liquid, powdered, or evaporated. Buttermilk that is made with low-fat milk. Nonfat or low-fat yogurt. Fats and oils Non-hydrogenated (trans-free) margarines. Vegetable oils, including soybean, sesame, sunflower, olive, peanut, safflower, corn, canola, and cottonseed. Salad dressings or mayonnaise made with a vegetable oil. Beverages Mineral water. Coffee and tea. Diet carbonated beverages. Sweets and desserts Sherbet, gelatin, and fruit ice. Small amounts of dark chocolate. Limit all sweets and desserts. Seasonings and condiments All seasonings and condiments. The items listed above may not be a complete list of foods and drinks you can eat. Contact a dietitian for more options. What foods should I avoid? Fruits Canned  fruit in heavy syrup. Fruit in cream or butter sauce. Fried fruit. Limit coconut. Vegetables Vegetables cooked in cheese, cream, or butter sauce. Fried vegetables. Grains Breads that are made with saturated or trans fats, oils, or whole milk. Croissants. Sweet rolls. Donuts. High-fat crackers, such as cheese crackers. Meats and other proteins Fatty meats, such as hot dogs, ribs, sausage, bacon, rib-eye roast or steak. High-fat deli meats, such as salami and bologna. Caviar. Domestic duck and goose. Organ meats, such as liver. Dairy Cream, sour cream, cream cheese, and creamed cottage cheese. Whole-milk cheeses. Whole or 2% milk that is liquid, evaporated, or condensed. Whole buttermilk. Cream sauce or high-fat cheese sauce. Yogurt that is made from whole milk. Fats and oils Meat fat, or shortening. Cocoa butter, hydrogenated oils, palm oil, coconut oil, palm kernel oil. Solid fats and shortenings, including bacon fat, salt pork, lard, and butter. Nondairy cream substitutes. Salad dressings with cheese or sour cream. Beverages Regular sodas and juice drinks with added sugar. Sweets and desserts Frosting. Pudding. Cookies. Cakes. Pies. Milk chocolate or white chocolate. Buttered syrups. Full-fat ice cream or ice cream drinks. The items listed above may not  be a complete list of foods and drinks to avoid. Contact a dietitian for more information. Summary  Heart-healthy meal planning includes eating less unhealthy fats, eating more healthy fats, and making other changes in your diet.  Eat a balanced diet. This includes fruits and vegetables, low-fat or nonfat dairy, lean protein, nuts and legumes, whole grains, and heart-healthy oils and fats. This information is not intended to replace advice given to you by your health care provider. Make sure you discuss any questions you have with your health care provider. Document Revised: 10/25/2017 Document Reviewed: 09/28/2017 Elsevier Patient Education   2021 [employer].  Two Gram Sodium Diet 2000 mg  What is Sodium? Sodium is a mineral found naturally in many foods. The most significant source of sodium in the diet is table salt, which is about 40% sodium.  Processed, convenience, and preserved foods also contain a large amount of sodium.  The body needs only 500 mg of sodium daily to function,  A normal diet provides more than enough sodium even if you do not use salt.  Why Limit Sodium? A build up of sodium in the body can cause thirst, increased blood pressure, shortness of breath, and water retention.  Decreasing sodium in the diet can reduce edema and risk of heart attack or stroke associated with high blood pressure.  Keep in mind that there are many other factors involved in these health problems.  Heredity, obesity, lack of exercise, cigarette smoking, stress and what you eat all play a role.  General Guidelines:  Do not add salt at the table or in cooking.  One teaspoon of salt contains over 2 grams of sodium.  Read food labels  Avoid processed and convenience foods  Ask your dietitian before eating any foods not dicussed in the menu planning guidelines  Consult your physician if you wish to use a salt substitute or a sodium containing medication such as antacids.  Limit milk and milk products to 16 oz (2 cups) per day.  Shopping Hints:  READ LABELS!! "Dietetic" does not necessarily mean low sodium.  Salt and other sodium ingredients are often added to foods during processing.   Menu Planning Guidelines Food Group Choose More Often Avoid  Beverages (see also the milk group All fruit juices, low-sodium, salt-free vegetables juices, low-sodium carbonated beverages Regular vegetable or tomato juices, commercially softened water used for drinking or cooking  Breads and Cereals Enriched white, wheat, rye and pumpernickel bread, hard rolls and dinner rolls; muffins, cornbread and waffles; most dry cereals, cooked cereal without  added salt; unsalted crackers and breadsticks; low sodium or homemade bread crumbs Bread, rolls and crackers with salted tops; quick breads; instant hot cereals; pancakes; commercial bread stuffing; self-rising flower and biscuit mixes; regular bread crumbs or cracker crumbs  Desserts and Sweets Desserts and sweets mad with mild should be within allowance Instant pudding mixes and cake mixes  Fats Butter or margarine; vegetable oils; unsalted salad dressings, regular salad dressings limited to 1 Tbs; light, sour and heavy cream Regular salad dressings containing bacon fat, bacon bits, and salt pork; snack dips made with instant soup mixes or processed cheese; salted nuts  Fruits Most fresh, frozen and canned fruits Fruits processed with salt or sodium-containing ingredient (some dried fruits are processed with sodium sulfites        Vegetables Fresh, frozen vegetables and low- sodium canned vegetables Regular canned vegetables, sauerkraut, pickled vegetables, and others prepared in brine; frozen vegetables in sauces; vegetables seasoned with ham,  bacon or salt pork  Condiments, Sauces, Miscellaneous  Salt substitute with physician's approval; pepper, herbs, spices; vinegar, lemon or lime juice; hot pepper sauce; garlic powder, onion powder, low sodium soy sauce (1 Tbs.); low sodium condiments (ketchup, chili sauce, mustard) in limited amounts (1 tsp.) fresh ground horseradish; unsalted tortilla chips, pretzels, potato chips, popcorn, salsa (1/4 cup) Any seasoning made with salt including garlic salt, celery salt, onion salt, and seasoned salt; sea salt, rock salt, kosher salt; meat tenderizers; monosodium glutamate; mustard, regular soy sauce, barbecue, sauce, chili sauce, teriyaki sauce, steak sauce, Worcestershire sauce, and most flavored vinegars; canned gravy and mixes; regular condiments; salted snack foods, olives, picles, relish, horseradish sauce, catsup   Food preparation: Try these  seasonings Meats:    Pork Sage, onion Serve with applesauce  Chicken Poultry seasoning, thyme, parsley Serve with cranberry sauce  Lamb Curry powder, rosemary, garlic, thyme Serve with mint sauce or jelly  Veal Marjoram, basil Serve with current jelly, cranberry sauce  Beef Pepper, bay leaf Serve with dry mustard, unsalted chive butter  Fish Bay leaf, dill Serve with unsalted lemon butter, unsalted parsley butter  Vegetables:    Asparagus Lemon juice   Broccoli Lemon juice   Carrots Mustard dressing parsley, mint, nutmeg, glazed with unsalted butter and sugar   Green beans Marjoram, lemon juice, nutmeg,dill seed   Tomatoes Basil, marjoram, onion   Spice /blend for [employer]" 4 tsp ground thyme 1 tsp ground sage 3 tsp ground rosemary 4 tsp ground marjoram   Test your knowledge 1. A product that says "Salt Free" may still contain sodium. True or False 2. Garlic Powder and Hot Pepper Sauce an be used as alternative seasonings.True or False 3. Processed foods have more sodium than fresh foods.  True or False 4. Canned Vegetables have less sodium than froze True or False  WAYS TO DECREASE YOUR SODIUM INTAKE 1. Avoid the use of added salt in cooking and at the table.  Table salt (and other prepared seasonings which contain salt) is probably one of the greatest sources of sodium in the diet.  Unsalted foods can gain flavor from the sweet, sour, and butter taste sensations of herbs and spices.  Instead of using salt for seasoning, try the following seasonings with the foods listed.  Remember: how you use them to enhance natural food flavors is limited only by your creativity... Allspice-Meat, fish, eggs, fruit, peas, red and yellow vegetables Almond Extract-Fruit baked goods Anise Seed-Sweet breads, fruit, carrots, beets, cottage cheese, cookies (tastes like licorice) Basil-Meat, fish, eggs, vegetables, rice, vegetables salads, soups, sauces Bay Leaf-Meat, fish, stews, poultry Burnet-Salad,  vegetables (cucumber-like flavor) Caraway Seed-Bread, cookies, cottage cheese, meat, vegetables, cheese, rice Cardamon-Baked goods, fruit, soups Celery Powder or seed-Salads, salad dressings, sauces, meatloaf, soup, bread.Do not use  celery salt Chervil-Meats, salads, fish, eggs, vegetables, cottage cheese (parsley-like flavor) Chili Power-Meatloaf, chicken cheese, corn, eggplant, egg dishes Chives-Salads cottage cheese, egg dishes, soups, vegetables, sauces Cilantro-Salsa, casseroles Cinnamon-Baked goods, fruit, pork, lamb, chicken, carrots Cloves-Fruit, baked goods, fish, pot roast, green beans, beets, carrots Coriander-Pastry, cookies, meat, salads, cheese (lemon-orange flavor) Cumin-Meatloaf, fish,cheese, eggs, cabbage,fruit pie (caraway flavor) [employer], fruit, eggs, fish, poultry, cottage cheese, vegetables Dill Seed-Meat, cottage cheese, poultry, vegetables, fish, salads, bread Fennel Seed-Bread, cookies, apples, pork, eggs, fish, beets, cabbage, cheese, Licorice-like flavor Garlic-(buds or powder) Salads, meat, poultry, fish, bread, butter, vegetables, potatoes.Do not  use garlic salt Ginger-Fruit, vegetables, baked goods, meat, fish, poultry Horseradish Root-Meet, vegetables, butter Lemon Juice or Extract-Vegetables,  fruit, tea, baked goods, fish salads Mace-Baked goods fruit, vegetables, fish, poultry (taste like nutmeg) Maple Extract-Syrups Marjoram-Meat, chicken, fish, vegetables, breads, green salads (taste like Sage) Mint-Tea, lamb, sherbet, vegetables, desserts, carrots, cabbage Mustard, Dry or Seed-Cheese, eggs, meats, vegetables, poultry Nutmeg-Baked goods, fruit, chicken, eggs, vegetables, desserts Onion Powder-Meat, fish, poultry, vegetables, cheese, eggs, bread, rice salads (Do not use   Onion salt) Orange Extract-Desserts, baked goods Oregano-Pasta, eggs, cheese, onions, pork, lamb, fish, chicken, vegetables, green salads Paprika-Meat, fish, poultry, eggs,  cheese, vegetables Parsley Flakes-Butter, vegetables, meat fish, poultry, eggs, bread, salads (certain forms may   Contain sodium Pepper-Meat fish, poultry, vegetables, eggs Peppermint Extract-Desserts, baked goods Poppy Seed-Eggs, bread, cheese, fruit dressings, baked goods, noodles, vegetables, cottage  [employer], poultry, meat, fish, cauliflower, turnips,eggs bread Saffron-Rice, bread, veal, chicken, fish, eggs Sage-Meat, fish, poultry, onions, eggplant, tomateos, pork, stews Savory-Eggs, salads, poultry, meat, rice, vegetables, soups, pork Tarragon-Meat, poultry, fish, eggs, butter, vegetables (licorice-like flavor)  Thyme-Meat, poultry, fish, eggs, vegetables, (clover-like flavor), sauces, soups Tumeric-Salads, butter, eggs, fish, rice, vegetables (saffron-like flavor) Vanilla Extract-Baked goods, candy Vinegar-Salads, vegetables, meat marinades Walnut Extract-baked goods, candy  2. Choose your Foods Wisely   The following is a list of foods to avoid which are high in sodium:  Meats-Avoid all smoked, canned, salt cured, dried and kosher meat and fish as well as Anchovies   [employer] Rx meats:Bologna, Liverwurst, Pastrami Canned meat or fish  Marinated herring Caviar    Pepperoni Corned Beef   Pizza Dried chipped beef  Salami Frozen breaded fish or meat Salt pork Frankfurters or hot dogs  Sardines Gefilte fish   Sausage Ham (boiled ham, Proscuitto Smoked butt    spiced ham)   Spam      TV Dinners Vegetables Canned vegetables (Regular) Relish Canned mushrooms  Sauerkraut Olives    Tomato juice Pickles  Bakery and Dessert Products Canned puddings  Cream pies Cheesecake   Decorated cakes Cookies  Beverages/Juices Tomato juice, regular  Gatorade   V-8 vegetable juice, regular  Breads and Cereals Biscuit mixes   Salted potato chips, corn chips, pretzels Bread stuffing mixes  Salted crackers and rolls Pancake and  waffle mixes Self-rising flour  Seasonings Accent    Meat sauces Barbecue sauce  Meat tenderizer Catsup    Monosodium glutamate (MSG) Celery salt   Onion salt Chili sauce   Prepared mustard Garlic salt   Salt, seasoned salt, sea salt Gravy mixes   Soy sauce Horseradish   Steak sauce Ketchup   Tartar sauce Lite salt    Teriyaki sauce Marinade mixes   Worcestershire sauce  Others Baking powder   Cocoa and cocoa mixes Baking soda   Commercial casserole mixes Candy-caramels, chocolate  Dehydrated soups    Bars, fudge,nougats  Instant rice and pasta mixes Canned broth or soup  Maraschino cherries Cheese, aged and processed cheese and cheese spreads  Learning Assessment Quiz  Indicated T (for True) or F (for False) for each of the following statements:  1. _____ Fresh fruits and vegetables and unprocessed grains are generally low in sodium 2. _____ Water may contain a considerable amount of sodium, depending on the source 3. _____ You can always tell if a food is high in sodium by tasting it 4. _____ Certain laxatives my be high in sodium and should be avoided unless prescribed   by a physician or pharmacist 5. _____ Salt substitutes may be used freely by anyone on a sodium restricted diet 6.  _____ Sodium is present in table salt, food additives and as a natural component of   most foods 7. _____ Table salt is approximately 90% sodium 8. _____ Limiting sodium intake may help prevent excess fluid accumulation in the body 9. _____ On a sodium-restricted diet, seasonings such as bouillon soy sauce, and    cooking wine should be used in place of table salt 10. _____ On an ingredient list, a product which lists monosodium glutamate as the first   ingredient is an appropriate food to include on a low sodium diet  Circle the best answer(s) to the following statements (Hint: there may be more than one correct answer)  11. On a low-sodium diet, some acceptable snack items are:    A.  Olives  F. Bean dip   K. Grapefruit juice    B. Salted Pretzels G. Commercial Popcorn   L. Canned peaches    C. Carrot Sticks  H. Bouillon   M. Unsalted nuts   D. [place] fries  I. Peanut butter crackers N. Salami   E. Sweet pickles J. Tomato Juice   O. Pizza  12.  Seasonings that may be used freely on a reduced - sodium diet include   A. Lemon wedges F.Monosodium glutamate K. Celery seed    B.Soysauce   G. Pepper   L. Mustard powder   C. Sea salt  H. Cooking wine  M. Onion flakes   D. Vinegar  E. Prepared horseradish N. Salsa   E. Sage   J. Worcestershire sauce  O. [place]

## 2020-09-29 NOTE — Addendum Note (Signed)
Addended by: Carylon Perches on: 09/29/2020 03:20 PM   Modules accepted: Orders

## 2020-09-30 ENCOUNTER — Ambulatory Visit (HOSPITAL_COMMUNITY): Payer: Medicaid Other | Admitting: Physical Therapy

## 2020-09-30 ENCOUNTER — Encounter (HOSPITAL_COMMUNITY): Payer: Self-pay | Admitting: Physical Therapy

## 2020-09-30 DIAGNOSIS — R262 Difficulty in walking, not elsewhere classified: Secondary | ICD-10-CM

## 2020-09-30 DIAGNOSIS — M6281 Muscle weakness (generalized): Secondary | ICD-10-CM | POA: Diagnosis not present

## 2020-09-30 DIAGNOSIS — G8929 Other chronic pain: Secondary | ICD-10-CM

## 2020-09-30 DIAGNOSIS — M5441 Lumbago with sciatica, right side: Secondary | ICD-10-CM | POA: Diagnosis not present

## 2020-09-30 NOTE — Therapy (Signed)
[place], [place], [address] Phone: [phone]   Fax:  [phone]  Physical Therapy Treatment  Patient Details  Name: Dominique Hinton MRN: [id number] Date of Birth: 02-Jan-1971 Referring Provider (PT): Drema Pry  PHYSICAL THERAPY DISCHARGE SUMMARY  Visits from Start of Care: 5  Current functional level related to goals / functional outcomes: See below    Remaining deficits: See below    Education / Equipment: See assessment Plan: Patient agrees to discharge.  Patient goals were partially met. Patient is being discharged due to a change in medical status.  ?????       Encounter Date: 09/30/2020   PT End of Session - 09/30/20 1403    Visit Number 5    Number of Visits 12    Date for PT Re-Evaluation 10/27/20    Authorization Type medicaid healthy blue- check auth, requested for first 3 visits, requested next 8 visits on 6/96/29, IF RECERT IS NEEDED PERFORM ON 10th visit with progress note    Authorization Time Period (still pending as of 09/30/20)    Authorization - Visit Number 4    Authorization - Number of Visits 0    Progress Note Due on Visit 10    PT Start Time 5284    PT Stop Time 1417    PT Time Calculation (min) 29 min    Activity Tolerance Patient tolerated treatment well;Patient limited by fatigue    Behavior During Therapy [facility] for tasks assessed/performed           Past Medical History:  Diagnosis Date  . Anxiety   . Arthritis   . Cervical pain   . Family history of adverse reaction to anesthesia    " My Mother has a hard time waking up "  . Hypertension     Past Surgical History:  Procedure Laterality Date  . ABDOMINAL HYSTERECTOMY    . APPENDECTOMY    . BREAST SURGERY    . CARDIAC CATHETERIZATION  09/21/2020  . CHOLECYSTECTOMY    . CORONARY BALLOON ANGIOPLASTY  09/21/2020  . CORONARY/GRAFT ACUTE MI REVASCULARIZATION N/A 09/21/2020   Procedure: Coronary/Graft  Acute MI Revascularization;  Surgeon: Jettie Booze, MD;  Location: [place] [facility];  Service: Cardiovascular;  Laterality: N/A;  . LEFT HEART CATH AND CORONARY ANGIOGRAPHY N/A 09/21/2020   Procedure: LEFT HEART CATH AND CORONARY ANGIOGRAPHY;  Surgeon: Jettie Booze, MD;  Location: [place] [facility];  Service: Cardiovascular;  Laterality: N/A;  . TUBAL LIGATION      There were no vitals filed for this visit.   Subjective Assessment - 09/30/20 1402    Subjective Patient returns to therapy after 2 weeks absence. She had a heart attack 1/17 and was in the hospital for a few days. She had a balloon angioplasty to reduce occlusions and was later released. She says her back has not bothered her lately because she is not doing much. She says she is not sure if she can continue with therapy because what little bit of activity she has done in the last few days has really wore her out. She is concerned about her heart and does not want to push it. She was cleared by MD for return to therapy for her lumbar.    Pertinent History history of neck and shoulder pain    Limitations Sitting;Walking;Standing;Lifting;House hold activities    Patient Stated Goals cleaning, able to stand and cook    Currently in  Pain? No/denies    Pain Onset More than a month ago              [facility] PT Assessment - 09/30/20 0001      Assessment   Medical Diagnosis LBP    Referring Provider (PT) Drema Pry      Restrictions   Weight Bearing Restrictions No      Balance Screen   Has the patient fallen in the past 6 months No      Prior Function   Level of Independence Independent with basic ADLs      Cognition   Overall Cognitive Status Within Functional Limits for tasks assessed      Observation/Other Assessments   Observations Appears fatigued, increased WOB with activity      AROM   Lumbar Flexion 50% limited   was 100%   Lumbar Extension 25% limited   25% limited   Lumbar - Right Side  Bend 25% limited    Lumbar - Left Side Bend 25% limited   75% limited   Lumbar - Right Rotation 25% limited   was 50%   Lumbar - Left Rotation 25% limited   was 50%                                  PT Short Term Goals - 09/30/20 1406      PT SHORT TERM GOAL #1   Title Patient will be able to walk 2 minutes without right leg giving out or feeling extremly weak to improve functional mobility    Baseline Patient states she is too fatigued to attempt today    Time 4    Period Weeks    Status Deferred    Target Date 09/29/20      PT SHORT TERM GOAL #2   Title Patient will be independent in self management strategies to improve quality of life and functional outcomes.    Baseline Reports compliance    Time 4    Period Weeks    Status Achieved    Target Date 09/29/20      PT SHORT TERM GOAL #3   Title Patient will report at least 50% improvement in overall symptoms and/or function to demonstrate improved functional mobility    Baseline Reports 50% improved    Time 4    Period Weeks    Status Achieved    Target Date 09/29/20             PT Long Term Goals - 09/30/20 1409      PT LONG TERM GOAL #1   Title Patient will be able to walk at least 300 feet in 2 minutes to demonstrate improved ambulatory mobility    Baseline Patient too fatigued to attempt today due to recent heart attack    Time 8    Period Weeks    Status Deferred      PT LONG TERM GOAL #2   Title Patient will be able to stand for at least 10 minutes without severe pain to improve ability to cook bacon and meal prep    Baseline Reports being able to stand 5-10 min this morning to brush teeth and do hair    Time 8    Period Weeks    Status Partially Met      PT LONG TERM GOAL #3   Title Patient will report at least 75% improvement in overall symptoms  and/or function to demonstrate improved functional mobility    Baseline Reports 50% improvement    Time 8    Period Weeks    Status  Not Met                 Plan - 09/30/20 1425    Clinical Impression Statement Patient requests DC from therapy today due to other medical issues ongoing. She does not feel comfortable continuing therapy due to current activity tolerance in light of recent heart attack. She wants to follow up with MD and return when she is feeling better, possibly for some conditioning and aquatic therapy. Despite this, patient does show moderate improvement in lumbar function. She reports overall decrease in pain level and improved AROM but is limited mostly by fatigue at present. Reviewed HEP and answered all patient goals. Patient instructed to follow up with therapy services with any further questions or concerns.    Personal Factors and Comorbidities Comorbidity 1;Comorbidity 2;Comorbidity 3+    Comorbidities shoulder pain, neck pain    Examination-Activity Limitations Transfers;Lift;Locomotion Level;Bend;Sit;Sleep;Squat;Stairs;Stand    Examination-Participation Restrictions Meal Prep;Laundry;Driving;Community Activity;Shop;Cleaning    Stability/Clinical Decision Making Stable/Uncomplicated    Rehab Potential Good    PT Frequency Other (comment)   1-2x/week for total of 12 visits over 8 week certification   PT Duration 8 weeks    PT Treatment/Interventions ADLs/Self Care Home Management;Aquatic Therapy;Electrical Stimulation;Iontophoresis 4mg /ml Dexamethasone;Moist Heat;Traction;DME Instruction;Neuromuscular re-education;Patient/family education;Manual techniques;Therapeutic exercise;Therapeutic activities;Functional mobility training;Stair training;Gait training;Dry needling;Joint Manipulations;Passive range of motion    PT Next Visit Plan DC to HEP    PT Home Exercise Plan breathing exercise - long exhale 09/08/20: POE stretch, ab set; 1/13 prone traction    Consulted and Agree with Plan of Care Patient           Patient will benefit from skilled therapeutic intervention in order to improve the  following deficits and impairments:  Pain,Decreased mobility,Difficulty walking,Decreased range of motion,Decreased strength,Decreased activity tolerance,Decreased endurance,Postural dysfunction  Visit Diagnosis: Chronic midline low back pain with right-sided sciatica  Difficulty in walking, not elsewhere classified  Muscle weakness (generalized)     Problem List Patient Active Problem List   Diagnosis Date Noted  . S/P PTCA (percutaneous transluminal coronary angioplasty)   . NSTEMI (non-ST elevated myocardial infarction) ([place]) 09/21/2020  . Chest pain 09/20/2020  . Obesity (BMI 30.0-34.9) 09/20/2020  . Essential hypertension 09/20/2020  . Tobacco abuse 09/20/2020  . Elevated troponin 09/20/2020  . Pain in both upper extremities 06/26/2019  . Weakness 06/26/2019   2:28 PM, 09/30/20 Josue Hector PT DPT  Physical Therapist with [facility]  [phone] [place] [address] [place], [place], [address] Phone: [phone]   Fax:  [phone]  Name: Dominique Hinton MRN: [id number] Date of Birth: 14-May-1971

## 2020-10-05 ENCOUNTER — Encounter (HOSPITAL_COMMUNITY): Payer: Medicaid Other | Admitting: Physical Therapy

## 2020-10-06 DIAGNOSIS — I252 Old myocardial infarction: Secondary | ICD-10-CM | POA: Diagnosis not present

## 2020-10-06 DIAGNOSIS — I10 Essential (primary) hypertension: Secondary | ICD-10-CM | POA: Diagnosis not present

## 2020-10-07 ENCOUNTER — Encounter (HOSPITAL_COMMUNITY): Payer: Medicaid Other | Admitting: Physical Therapy

## 2020-10-09 ENCOUNTER — Other Ambulatory Visit: Payer: Self-pay | Admitting: Neurology

## 2020-10-20 DIAGNOSIS — E785 Hyperlipidemia, unspecified: Secondary | ICD-10-CM | POA: Diagnosis not present

## 2020-10-20 DIAGNOSIS — I219 Acute myocardial infarction, unspecified: Secondary | ICD-10-CM | POA: Diagnosis not present

## 2020-10-20 DIAGNOSIS — Z79899 Other long term (current) drug therapy: Secondary | ICD-10-CM | POA: Diagnosis not present

## 2020-10-20 DIAGNOSIS — I10 Essential (primary) hypertension: Secondary | ICD-10-CM | POA: Diagnosis not present

## 2020-10-27 DIAGNOSIS — E785 Hyperlipidemia, unspecified: Secondary | ICD-10-CM | POA: Diagnosis not present

## 2020-10-27 DIAGNOSIS — I252 Old myocardial infarction: Secondary | ICD-10-CM | POA: Diagnosis not present

## 2020-10-28 DIAGNOSIS — M47816 Spondylosis without myelopathy or radiculopathy, lumbar region: Secondary | ICD-10-CM | POA: Diagnosis not present

## 2020-11-02 ENCOUNTER — Encounter (HOSPITAL_COMMUNITY): Payer: Self-pay

## 2020-11-02 ENCOUNTER — Ambulatory Visit (HOSPITAL_COMMUNITY): Payer: Medicaid Other | Attending: Physical Medicine & Rehabilitation

## 2020-11-02 ENCOUNTER — Other Ambulatory Visit: Payer: Self-pay

## 2020-11-02 DIAGNOSIS — R262 Difficulty in walking, not elsewhere classified: Secondary | ICD-10-CM | POA: Diagnosis not present

## 2020-11-02 DIAGNOSIS — G8929 Other chronic pain: Secondary | ICD-10-CM | POA: Diagnosis not present

## 2020-11-02 DIAGNOSIS — M5441 Lumbago with sciatica, right side: Secondary | ICD-10-CM | POA: Insufficient documentation

## 2020-11-02 DIAGNOSIS — M6281 Muscle weakness (generalized): Secondary | ICD-10-CM | POA: Insufficient documentation

## 2020-11-02 NOTE — Therapy (Signed)
[place], [place], [address] Phone: [phone]   Fax:  [phone]  Physical Therapy Evaluation  Patient Details  Name: Dominique Hinton MRN: [id number] Date of Birth: 1970-09-10 Referring Provider (PT): Drema Pry   Encounter Date: 11/02/2020   PT End of Session - 11/02/20 0811    Visit Number 1    Number of Visits 12    Date for PT Re-Evaluation 12/28/20    Authorization Type [place] Medicaid HealthyBlue    Progress Note Due on Visit 10    PT Start Time 0815    PT Stop Time 0855    PT Time Calculation (min) 40 min    Activity Tolerance Patient tolerated treatment well;Patient limited by fatigue    Behavior During Therapy [facility] for tasks assessed/performed           Past Medical History:  Diagnosis Date  . Anxiety   . Arthritis   . Cervical pain   . Family history of adverse reaction to anesthesia    " My Mother has a hard time waking up "  . Hypertension     Past Surgical History:  Procedure Laterality Date  . ABDOMINAL HYSTERECTOMY    . APPENDECTOMY    . BREAST SURGERY    . CARDIAC CATHETERIZATION  09/21/2020  . CHOLECYSTECTOMY    . CORONARY BALLOON ANGIOPLASTY  09/21/2020  . CORONARY/GRAFT ACUTE MI REVASCULARIZATION N/A 09/21/2020   Procedure: Coronary/Graft Acute MI Revascularization;  Surgeon: Jettie Booze, MD;  Location: [place] [facility];  Service: Cardiovascular;  Laterality: N/A;  . LEFT HEART CATH AND CORONARY ANGIOGRAPHY N/A 09/21/2020   Procedure: LEFT HEART CATH AND CORONARY ANGIOGRAPHY;  Surgeon: Jettie Booze, MD;  Location: [place] [facility];  Service: Cardiovascular;  Laterality: N/A;  . TUBAL LIGATION      There were no vitals filed for this visit.    Subjective Assessment - 11/02/20 0815    Subjective Pt has long hx of LBP and RLE pain with insidious onset and notes prolonged driving and sitting exacerbate and notes shooting pain along right anterior thigh  and occasional symptoms felt in the right ankle. Pt has recent hx of MI with PCTA and has been largely sedentary since onset of MI.  Pt reports fluctuating pain and RLE levels    Pertinent History history of neck and shoulder pain    Limitations Sitting;Walking;Standing;Lifting;House hold activities    Patient Stated Goals cleaning, able to stand and cook    Currently in Pain? Yes    Pain Score 5     Pain Location Back    Pain Orientation Right    Pain Descriptors / Indicators Aching    Pain Type Chronic pain    Pain Radiating Towards down RLE anterior thigh to posterior thigh    Aggravating Factors  prolonged sitting or standing/stress    Pain Relieving Factors rest, medication, position change              [facility] PT Assessment - 11/02/20 0001      Assessment   Medical Diagnosis LBP    Referring Provider (PT) Drema Pry      Prior Function   Level of Independence Independent      Cognition   Overall Cognitive Status Within Functional Limits for tasks assessed      AROM   Lumbar Flexion 50% limited    Lumbar Extension 25% limited    Lumbar - Right Side Bend  WNL    Lumbar - Left Side Bend 25% limited    Lumbar - Right Rotation 25% limited    Lumbar - Left Rotation 25% limited      Strength   Strength Assessment Site Lumbar    Lumbar Flexion 3-/5    Lumbar Extension 3-/5      Special Tests   Lumbar Tests Slump Test      Slump test   Findings Positive    Side Right      Ambulation/Gait   Ambulation/Gait Yes    Ambulation/Gait Assistance 7: Independent    Ambulation Distance (Feet) 225 Feet    Ambulation Surface Level    Gait Comments 2MWT                      Objective measurements completed on examination: See above findings.       [facility] Adult PT Treatment/Exercise - 11/02/20 0001      Bed Mobility   Bed Mobility Sit to Supine;Supine to Sit    Supine to Sit Independent    Sit to Supine Independent      Transfers   Transfers Sit  to Stand;Stand to Sit    Sit to Stand 7: Independent      Lumbar Exercises: Stretches   Lower Trunk Rotation 5 reps;60 seconds      Lumbar Exercises: Prone   Other Prone Lumbar Exercises prone 3 min, prone on elbows 2x10                  PT Education - 11/02/20 0841    Education Details pt education in lumbar spine and disc anatomy and importance of postion/posture and its influence on disc pressures    Person(s) Educated Patient    Methods Explanation    Comprehension Verbalized understanding            PT Short Term Goals - 11/02/20 0859      PT SHORT TERM GOAL #1   Title Patient will report centralization of RLE with symptoms not extending beyond right buttock    Baseline posterior/lateral right knee    Time 4    Period Weeks    Status New    Target Date 11/30/20      PT SHORT TERM GOAL #2   Title Patient will be independent in self management strategies to improve quality of life and functional outcomes.    Time 4    Period Weeks    Status New    Target Date 11/30/20      PT SHORT TERM GOAL #3   Title Patient will report at least 50% improvement in overall symptoms and/or function to demonstrate improved functional mobility    Time 4    Period Weeks    Status New    Target Date 11/30/20      PT SHORT TERM GOAL #4   Title Patient will be independent with HEP in order to improve functional outcomes.    Time 4    Period Weeks    Status New    Target Date 11/30/20             PT Long Term Goals - 11/02/20 0905      PT LONG TERM GOAL #1   Title Patient will be able to walk at least 300 feet in 2 minutes to demonstrate improved ambulatory mobility    Baseline 225 ft    Time 8    Period Weeks  Status New    Target Date 12/28/20      PT LONG TERM GOAL #2   Title Patient will be able to stand for at least 15 minutes without severe pain to improve ability to cook and meal prep    Baseline Reports being able to stand 5-10 min this morning to  brush teeth and do hair    Time 8    Period Weeks    Status New    Target Date 12/28/20      PT LONG TERM GOAL #3   Title Patient will report at least 75% improvement in overall symptoms and/or function to demonstrate improved functional mobility    Baseline Reports 50% improvement    Time 8    Period Weeks    Status New    Target Date 12/28/20                  Plan - 11/02/20 0855    Clinical Impression Statement Patient presents to clinic with chronic LBP and RLE symptoms which she attributes to 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  Patient would benefit from skilled PT services to train and instruct in self-management techniques and strategies to centralize and abolish pain to enable to return to PLOF and typical activities and reduce risk for reinjury    Personal Factors and Comorbidities Comorbidity 1;Comorbidity 2;Comorbidity 3+;Time since onset of injury/illness/exacerbation    Comorbidities shoulder pain, neck pain    Examination-Activity Limitations Transfers;Lift;Locomotion Level;Bend;Sit;Sleep;Squat;Stairs;Stand    Examination-Participation Restrictions Meal Prep;Laundry;Driving;Community Activity;Shop;Cleaning    Stability/Clinical Decision Making Stable/Uncomplicated    Clinical Decision Making Low    Rehab Potential Good    PT Frequency Other (comment)   1-2x/week for total of 12 visits over 8 week certification   PT Duration 8 weeks    PT Treatment/Interventions ADLs/Self Care Home Management;Aquatic Therapy;Electrical Stimulation;Iontophoresis 4mg /ml Dexamethasone;Moist Heat;Traction;DME Instruction;Neuromuscular re-education;Patient/family education;Manual techniques;Therapeutic exercise;Therapeutic activities;Functional mobility training;Stair training;Gait training;Dry needling;Joint Manipulations;Passive range of motion    PT Next  Visit Plan continue with Mckenzie extension    PT Home Exercise Plan prone, lumbar support    Consulted and Agree with Plan of Care Patient           Patient will benefit from skilled therapeutic intervention in order to improve the following deficits and impairments:  Pain,Decreased mobility,Difficulty walking,Decreased range of motion,Decreased strength,Decreased activity tolerance,Decreased endurance,Postural dysfunction,Improper body mechanics  Visit Diagnosis: Chronic midline low back pain with right-sided sciatica  Difficulty in walking, not elsewhere classified  Muscle weakness (generalized)     Problem List Patient Active Problem List   Diagnosis Date Noted  . S/P PTCA (percutaneous transluminal coronary angioplasty)   . NSTEMI (non-ST elevated myocardial infarction) ([place]) 09/21/2020  . Chest pain 09/20/2020  . Obesity (BMI 30.0-34.9) 09/20/2020  . Essential hypertension 09/20/2020  . Tobacco abuse 09/20/2020  . Elevated troponin 09/20/2020  . Pain in both upper extremities 06/26/2019  . Weakness 06/26/2019   9:08 AM, 11/02/20 M. Sherlyn Lees, PT, DPT Physical Therapist- [place] Office Number: [phone]  [place] [address] [place], [place], [address] Phone: [phone]   Fax:  [phone]  Name: Dominique Hinton MRN: [id number] Date of Birth: [birth year]

## 2020-11-09 ENCOUNTER — Ambulatory Visit (HOSPITAL_COMMUNITY): Payer: Medicaid Other | Admitting: Physical Therapy

## 2020-11-09 ENCOUNTER — Other Ambulatory Visit: Payer: Self-pay

## 2020-11-09 DIAGNOSIS — G8929 Other chronic pain: Secondary | ICD-10-CM

## 2020-11-09 DIAGNOSIS — R262 Difficulty in walking, not elsewhere classified: Secondary | ICD-10-CM | POA: Diagnosis not present

## 2020-11-09 DIAGNOSIS — M6281 Muscle weakness (generalized): Secondary | ICD-10-CM | POA: Diagnosis not present

## 2020-11-09 DIAGNOSIS — M5441 Lumbago with sciatica, right side: Secondary | ICD-10-CM | POA: Diagnosis not present

## 2020-11-09 NOTE — Therapy (Signed)
[place], [place], [address] Phone: [phone]   Fax:  [phone]  Physical Therapy Treatment  Patient Details  Name: Dominique Hinton MRN: [id number] Date of Birth: 04-10-1971 Referring Provider (PT): Drema Pry   Encounter Date: 11/09/2020   PT End of Session - 11/09/20 1302    Visit Number 2    Number of Visits 12    Date for PT Re-Evaluation 12/28/20    Authorization Type [place] Medicaid [employer]    Progress Note Due on Visit 10    PT Start Time 0833    PT Stop Time 0915    PT Time Calculation (min) 42 min    Activity Tolerance Patient tolerated treatment well;Patient limited by fatigue    Behavior During Therapy [facility] for tasks assessed/performed           Past Medical History:  Diagnosis Date  . Anxiety   . Arthritis   . Cervical pain   . Family history of adverse reaction to anesthesia    " My Mother has a hard time waking up "  . Hypertension     Past Surgical History:  Procedure Laterality Date  . ABDOMINAL HYSTERECTOMY    . APPENDECTOMY    . BREAST SURGERY    . CARDIAC CATHETERIZATION  09/21/2020  . CHOLECYSTECTOMY    . CORONARY BALLOON ANGIOPLASTY  09/21/2020  . CORONARY/GRAFT ACUTE MI REVASCULARIZATION N/A 09/21/2020   Procedure: Coronary/Graft Acute MI Revascularization;  Surgeon: Jettie Booze, MD;  Location: [place];  Service: Cardiovascular;  Laterality: N/A;  . LEFT HEART CATH AND CORONARY ANGIOGRAPHY N/A 09/21/2020   Procedure: LEFT HEART CATH AND CORONARY ANGIOGRAPHY;  Surgeon: Jettie Booze, MD;  Location: [facility];  Service: Cardiovascular;  Laterality: N/A;  . TUBAL LIGATION      There were no vitals filed for this visit.   Subjective Assessment - 11/09/20 0840    Subjective Pt states she continues to do her prone exercises that she was doing prior to her HA.  Currently 5/10 pain in LBP.    Currently in Pain? Yes    Pain Score 5      Pain Location Back    Pain Orientation Right    Pain Descriptors / Indicators Aching                             [facility] Adult PT Treatment/Exercise - 11/09/20 0001      Lumbar Exercises: Stretches   Lower Trunk Rotation 5 reps;10 seconds    Prone on Elbows Stretch Limitations    Prone on Elbows Stretch Limitations 2 minutes    Press Ups 5 reps      Lumbar Exercises: Standing   Other Standing Lumbar Exercises hip excursions 5X each way      Lumbar Exercises: Supine   Ab Set 5 seconds;15 reps      Lumbar Exercises: Prone   Other Prone Lumbar Exercises prone 3 min, prone on elbows 2', pressups 5X    Other Prone Lumbar Exercises heelsqueeze 10X5"                  PT Education - 11/09/20 0903    Education Details reviewed goals, HEP and POC moving forward    Person(s) Educated Patient    Methods Explanation;Demonstration;Tactile cues    Comprehension Verbalized understanding;Returned demonstration;Verbal cues required  PT Short Term Goals - 11/09/20 0902      PT SHORT TERM GOAL #1   Title Patient will report centralization of RLE with symptoms not extending beyond right buttock    Baseline posterior/lateral right knee    Time 4    Period Weeks    Status On-going    Target Date 11/30/20      PT SHORT TERM GOAL #2   Title Patient will be independent in self management strategies to improve quality of life and functional outcomes.    Time 4    Period Weeks    Status On-going    Target Date 11/30/20      PT SHORT TERM GOAL #3   Title Patient will report at least 50% improvement in overall symptoms and/or function to demonstrate improved functional mobility    Time 4    Period Weeks    Status On-going    Target Date 11/30/20      PT SHORT TERM GOAL #4   Title Patient will be independent with HEP in order to improve functional outcomes.    Time 4    Period Weeks    Status On-going    Target Date 11/30/20             PT  Long Term Goals - 11/09/20 0902      PT LONG TERM GOAL #1   Title Patient will be able to walk at least 300 feet in 2 minutes to demonstrate improved ambulatory mobility    Baseline 225 ft    Time 8    Period Weeks    Status On-going      PT LONG TERM GOAL #2   Title Patient will be able to stand for at least 15 minutes without severe pain to improve ability to cook and meal prep    Baseline Reports being able to stand 5-10 min this morning to brush teeth and do hair    Time 8    Period Weeks    Status On-going      PT LONG TERM GOAL #3   Title Patient will report at least 75% improvement in overall symptoms and/or function to demonstrate improved functional mobility    Baseline Reports 50% improvement    Time 8    Period Weeks    Status On-going                 Plan - 11/09/20 1304    Clinical Impression Statement Reviewed goals and POC moving forward. Continued with extension based therex with addition of press ups in prone today with good results.  Added hip excursions to improve general lumbar mobility with cues for form and reducing ROM into extension.    Pt reports she is fine as long as she is Up straight and moving. Bending over/forward is extremely painful and difficult as takes a while to stand back up.  Pt given hip excursions to add to HEP as well as press ups and heelsqueezes.    Personal Factors and Comorbidities Comorbidity 1;Comorbidity 2;Comorbidity 3+;Time since onset of injury/illness/exacerbation    Comorbidities shoulder pain, neck pain    Examination-Activity Limitations Transfers;Lift;Locomotion Level;Bend;Sit;Sleep;Squat;Stairs;Stand    Examination-Participation Restrictions Meal Prep;Laundry;Driving;Community Activity;Shop;Cleaning    Stability/Clinical Decision Making Stable/Uncomplicated    Rehab Potential Good    PT Frequency Other (comment)   1-2x/week for total of 12 visits over 8 week certification   PT Duration 8 weeks    PT  Treatment/Interventions ADLs/Self Care Home  Management;Aquatic Therapy;Electrical Stimulation;Iontophoresis 4mg /ml Dexamethasone;Moist Heat;Traction;DME Instruction;Neuromuscular re-education;Patient/family education;Manual techniques;Therapeutic exercise;Therapeutic activities;Functional mobility training;Stair training;Gait training;Dry needling;Joint Manipulations;Passive range of motion    PT Next Visit Plan continue with Mckenzie extension.  Progress per tolerance.    PT Home Exercise Plan prone, lumbar support  3/8: heelsqueeze, press ups, hip excursions    Consulted and Agree with Plan of Care Patient           Patient will benefit from skilled therapeutic intervention in order to improve the following deficits and impairments:  Pain,Decreased mobility,Difficulty walking,Decreased range of motion,Decreased strength,Decreased activity tolerance,Decreased endurance,Postural dysfunction,Improper body mechanics  Visit Diagnosis: Chronic midline low back pain with right-sided sciatica  Difficulty in walking, not elsewhere classified  Muscle weakness (generalized)     Problem List Patient Active Problem List   Diagnosis Date Noted  . S/P PTCA (percutaneous transluminal coronary angioplasty)   . NSTEMI (non-ST elevated myocardial infarction) ([place]) 09/21/2020  . Chest pain 09/20/2020  . Obesity (BMI 30.0-34.9) 09/20/2020  . Essential hypertension 09/20/2020  . Tobacco abuse 09/20/2020  . Elevated troponin 09/20/2020  . Pain in both upper extremities 06/26/2019  . Weakness 06/26/2019   Teena Irani, PTA/CLT [phone]  Teena Irani 11/09/2020, 1:05 PM  [facility] [address] [place], [place], [address] Phone: [phone]   Fax:  [phone]  Name: Dominique Hinton MRN: [id number] Date of Birth: July 15, 1971

## 2020-11-10 ENCOUNTER — Ambulatory Visit (HOSPITAL_COMMUNITY): Payer: Medicaid Other | Admitting: Physical Therapy

## 2020-11-10 DIAGNOSIS — M5441 Lumbago with sciatica, right side: Secondary | ICD-10-CM

## 2020-11-10 DIAGNOSIS — M6281 Muscle weakness (generalized): Secondary | ICD-10-CM | POA: Diagnosis not present

## 2020-11-10 DIAGNOSIS — R262 Difficulty in walking, not elsewhere classified: Secondary | ICD-10-CM | POA: Diagnosis not present

## 2020-11-10 DIAGNOSIS — G8929 Other chronic pain: Secondary | ICD-10-CM | POA: Diagnosis not present

## 2020-11-10 NOTE — Therapy (Signed)
[place], [place], [address] Phone: [phone]   Fax:  [phone]  Physical Therapy Treatment  Patient Details  Name: Dominique Hinton MRN: [id number] Date of Birth: October 01, 1970 Referring Provider (PT): Drema Pry   Encounter Date: 11/10/2020   PT End of Session - 11/10/20 0914    Visit Number 3    Number of Visits 12    Date for PT Re-Evaluation 12/28/20    Authorization Type [place] Medicaid [employer]    Progress Note Due on Visit 10    PT Start Time 0832    PT Stop Time 0914    PT Time Calculation (min) 42 min    Activity Tolerance Patient tolerated treatment well;Patient limited by pain    Behavior During Therapy [facility] for tasks assessed/performed           Past Medical History:  Diagnosis Date  . Anxiety   . Arthritis   . Cervical pain   . Family history of adverse reaction to anesthesia    " My Mother has a hard time waking up "  . Hypertension     Past Surgical History:  Procedure Laterality Date  . ABDOMINAL HYSTERECTOMY    . APPENDECTOMY    . BREAST SURGERY    . CARDIAC CATHETERIZATION  09/21/2020  . CHOLECYSTECTOMY    . CORONARY BALLOON ANGIOPLASTY  09/21/2020  . CORONARY/GRAFT ACUTE MI REVASCULARIZATION N/A 09/21/2020   Procedure: Coronary/Graft Acute MI Revascularization;  Surgeon: Jettie Booze, MD;  Location: [facility];  Service: Cardiovascular;  Laterality: N/A;  . LEFT HEART CATH AND CORONARY ANGIOGRAPHY N/A 09/21/2020   Procedure: LEFT HEART CATH AND CORONARY ANGIOGRAPHY;  Surgeon: Jettie Booze, MD;  Location: [facility];  Service: Cardiovascular;  Laterality: N/A;  . TUBAL LIGATION      There were no vitals filed for this visit.   Subjective Assessment - 11/10/20 0838    Subjective pt states she is helping to clean out her parents house and she is taking her time doing this.  States her back actually felt good this morning.  Currently 4/10  pain.    Currently in Pain? Yes    Pain Score 4     Pain Location Back    Pain Orientation Right    Pain Descriptors / Indicators Aching    Pain Radiating Towards Rt posterior thigh at times; sometimes feels tight and sometimes when sitting has pain in anterior aspect that stops at knee                             [facility] Adult PT Treatment/Exercise - 11/10/20 0001      Lumbar Exercises: Stretches   Prone on Elbows Stretch Limitations    Prone on Elbows Stretch Limitations 2 minutes    Press Ups 5 reps;Limitations    Press Ups Limitations --      Lumbar Exercises: Standing   Other Standing Lumbar Exercises hip excursions 5X each way      Lumbar Exercises: Supine   Bridge 10 reps      Lumbar Exercises: Sidelying   Hip Abduction Both;10 reps;Limitations    Hip Abduction Weights (lbs) 2 sets      Lumbar Exercises: Prone   Straight Leg Raise 10 reps    Other Prone Lumbar Exercises prone 3 min, prone on elbows 2', pressups 5X    Other Prone Lumbar Exercises  heelsqueeze 10X5"                    PT Short Term Goals - 11/09/20 0902      PT SHORT TERM GOAL #1   Title Patient will report centralization of RLE with symptoms not extending beyond right buttock    Baseline posterior/lateral right knee    Time 4    Period Weeks    Status On-going    Target Date 11/30/20      PT SHORT TERM GOAL #2   Title Patient will be independent in self management strategies to improve quality of life and functional outcomes.    Time 4    Period Weeks    Status On-going    Target Date 11/30/20      PT SHORT TERM GOAL #3   Title Patient will report at least 50% improvement in overall symptoms and/or function to demonstrate improved functional mobility    Time 4    Period Weeks    Status On-going    Target Date 11/30/20      PT SHORT TERM GOAL #4   Title Patient will be independent with HEP in order to improve functional outcomes.    Time 4    Period Weeks     Status On-going    Target Date 11/30/20             PT Long Term Goals - 11/09/20 0902      PT LONG TERM GOAL #1   Title Patient will be able to walk at least 300 feet in 2 minutes to demonstrate improved ambulatory mobility    Baseline 225 ft    Time 8    Period Weeks    Status On-going      PT LONG TERM GOAL #2   Title Patient will be able to stand for at least 15 minutes without severe pain to improve ability to cook and meal prep    Baseline Reports being able to stand 5-10 min this morning to brush teeth and do hair    Time 8    Period Weeks    Status On-going      PT LONG TERM GOAL #3   Title Patient will report at least 75% improvement in overall symptoms and/or function to demonstrate improved functional mobility    Baseline Reports 50% improvement    Time 8    Period Weeks    Status On-going                 Plan - 11/10/20 8502    Clinical Impression Statement Pt reporting improvement when woke this morning.   Began with standing excursions into prone lying and exercises.  Pt with a little discomfort completing hip extensions but reduced with decreased ROM.    Added side lying hip abduction with only one set completed with Rt LE due to discomfort. Bridge was also a little uncomfortable initially.   Pt without any other issues completing therex.  Press ups/prone seem to reduce symptoms.    Personal Factors and Comorbidities Comorbidity 1;Comorbidity 2;Comorbidity 3+;Time since onset of injury/illness/exacerbation    Comorbidities shoulder pain, neck pain    Examination-Activity Limitations Transfers;Lift;Locomotion Level;Bend;Sit;Sleep;Squat;Stairs;Stand    Examination-Participation Restrictions Meal Prep;Laundry;Driving;Community Activity;Shop;Cleaning    Stability/Clinical Decision Making Stable/Uncomplicated    Rehab Potential Good    PT Frequency Other (comment)   1-2x/week for total of 12 visits over 8 week certification   PT Duration 8 weeks  PT  Treatment/Interventions ADLs/Self Care Home Management;Aquatic Therapy;Electrical Stimulation;Iontophoresis 4mg /ml Dexamethasone;Moist Heat;Traction;DME Instruction;Neuromuscular re-education;Patient/family education;Manual techniques;Therapeutic exercise;Therapeutic activities;Functional mobility training;Stair training;Gait training;Dry needling;Joint Manipulations;Passive range of motion    PT Next Visit Plan continue with Mckenzie extension.  Progress per tolerance.    PT Home Exercise Plan prone, lumbar support  3/8: heelsqueeze, press ups, hip excursions    Consulted and Agree with Plan of Care Patient           Patient will benefit from skilled therapeutic intervention in order to improve the following deficits and impairments:  Pain,Decreased mobility,Difficulty walking,Decreased range of motion,Decreased strength,Decreased activity tolerance,Decreased endurance,Postural dysfunction,Improper body mechanics  Visit Diagnosis: Chronic midline low back pain with right-sided sciatica  Difficulty in walking, not elsewhere classified  Muscle weakness (generalized)     Problem List Patient Active Problem List   Diagnosis Date Noted  . S/P PTCA (percutaneous transluminal coronary angioplasty)   . NSTEMI (non-ST elevated myocardial infarction) ([place]) 09/21/2020  . Chest pain 09/20/2020  . Obesity (BMI 30.0-34.9) 09/20/2020  . Essential hypertension 09/20/2020  . Tobacco abuse 09/20/2020  . Elevated troponin 09/20/2020  . Pain in both upper extremities 06/26/2019  . Weakness 06/26/2019   Teena Irani, PTA/CLT [phone]  Teena Irani 11/10/2020, 9:25 AM  [facility] [place], [place], [address] Phone: [phone]   Fax:  [phone]  Name: Dominique Hinton MRN: [id number] Date of Birth: Nov 07, 1970

## 2020-11-11 ENCOUNTER — Telehealth: Payer: Self-pay | Admitting: Internal Medicine

## 2020-11-11 NOTE — Telephone Encounter (Signed)
Left Ms.Kromer a message today concerning a phone appt with the Managed Medicaid Team. I left my contact info on her voicemail. I will out again in 7-14 days if I have not heard back from her.

## 2020-11-16 ENCOUNTER — Encounter (HOSPITAL_COMMUNITY): Payer: Self-pay

## 2020-11-16 ENCOUNTER — Ambulatory Visit (HOSPITAL_COMMUNITY): Payer: Medicaid Other

## 2020-11-16 ENCOUNTER — Other Ambulatory Visit: Payer: Self-pay

## 2020-11-16 DIAGNOSIS — R262 Difficulty in walking, not elsewhere classified: Secondary | ICD-10-CM

## 2020-11-16 DIAGNOSIS — G8929 Other chronic pain: Secondary | ICD-10-CM | POA: Diagnosis not present

## 2020-11-16 DIAGNOSIS — M6281 Muscle weakness (generalized): Secondary | ICD-10-CM | POA: Diagnosis not present

## 2020-11-16 DIAGNOSIS — M5441 Lumbago with sciatica, right side: Secondary | ICD-10-CM

## 2020-11-16 NOTE — Therapy (Signed)
[place], [place], [address] Phone: [phone]   Fax:  [phone]  Physical Therapy Treatment  Patient Details  Name: Dominique Hinton MRN: [id number] Date of Birth: 1970-10-31 Referring Provider (PT): Drema Pry   Encounter Date: 11/16/2020   PT End of Session - 11/16/20 0837    Visit Number 4    Number of Visits 12    Date for PT Re-Evaluation 12/28/20    Authorization Type  Medicaid [employer]    Authorization - Visit Number --    Authorization - Number of Visits --    Progress Note Due on Visit 10    PT Start Time 0831    PT Stop Time 0912    PT Time Calculation (min) 41 min    Activity Tolerance Patient tolerated treatment well;Patient limited by pain    Behavior During Therapy [facility] for tasks assessed/performed           Past Medical History:  Diagnosis Date  . Anxiety   . Arthritis   . Cervical pain   . Family history of adverse reaction to anesthesia    " My Mother has a hard time waking up "  . Hypertension     Past Surgical History:  Procedure Laterality Date  . ABDOMINAL HYSTERECTOMY    . APPENDECTOMY    . BREAST SURGERY    . CARDIAC CATHETERIZATION  09/21/2020  . CHOLECYSTECTOMY    . CORONARY BALLOON ANGIOPLASTY  09/21/2020  . CORONARY/GRAFT ACUTE MI REVASCULARIZATION N/A 09/21/2020   Procedure: Coronary/Graft Acute MI Revascularization;  Surgeon: Jettie Booze, MD;  Location: [facility];  Service: Cardiovascular;  Laterality: N/A;  . LEFT HEART CATH AND CORONARY ANGIOGRAPHY N/A 09/21/2020   Procedure: LEFT HEART CATH AND CORONARY ANGIOGRAPHY;  Surgeon: Jettie Booze, MD;  Location: [place] [facility];  Service: Cardiovascular;  Laterality: N/A;  . TUBAL LIGATION      There were no vitals filed for this visit.   Subjective Assessment - 11/16/20 0833    Subjective Reports she is doing well today.  Has been helping parents move and feels a little stiff  this morning.  Current pain scale 1/10.  Stated she feels weak in mid back that encourages her to forward bend when she gets tired.    Patient Stated Goals cleaning, able to stand and cook    Currently in Pain? Yes    Pain Score 1     Pain Location Back    Pain Orientation Lower    Pain Descriptors / Indicators Aching    Pain Type Chronic pain    Pain Radiating Towards Rt posterior thigh to knee; sometimes feels tight while sitting for short duration of sitting.    Pain Onset More than a month ago    Pain Frequency Intermittent    Aggravating Factors  prolonged sitting or standing/stress    Pain Relieving Factors rest, medication, position change              [facility] PT Assessment - 11/16/20 0001      Assessment   Medical Diagnosis LBP    Referring Provider (PT) Drema Pry                         [facility] Adult PT Treatment/Exercise - 11/16/20 0001      Lumbar Exercises: Stretches   Lower Trunk Rotation 5 reps;10 seconds    Prone on Elbows  Stretch Limitations    Prone on Elbows Stretch Limitations 2 minutes    Press Ups 5 reps;10 seconds      Lumbar Exercises: Standing   Functional Squats 10 reps    Functional Squats Limitations 3D hip excursion, cueing for mechanics with squats and pain free range wiht extension      Lumbar Exercises: Seated   Other Seated Lumbar Exercises scapular retraction      Lumbar Exercises: Supine   Bridge 10 reps;3 seconds    Bridge Limitations ab set and paired wiht exhale to continue breathing      Lumbar Exercises: Prone   Single Arm Raise Right;Left;5 reps;5 seconds    Straight Leg Raise 10 reps    Other Prone Lumbar Exercises prone 2 min, prone on elbows 2', pressups 5X    Other Prone Lumbar Exercises heelsqueeze 10X5"                    PT Short Term Goals - 11/09/20 0902      PT SHORT TERM GOAL #1   Title Patient will report centralization of RLE with symptoms not extending beyond right buttock     Baseline posterior/lateral right knee    Time 4    Period Weeks    Status On-going    Target Date 11/30/20      PT SHORT TERM GOAL #2   Title Patient will be independent in self management strategies to improve quality of life and functional outcomes.    Time 4    Period Weeks    Status On-going    Target Date 11/30/20      PT SHORT TERM GOAL #3   Title Patient will report at least 50% improvement in overall symptoms and/or function to demonstrate improved functional mobility    Time 4    Period Weeks    Status On-going    Target Date 11/30/20      PT SHORT TERM GOAL #4   Title Patient will be independent with HEP in order to improve functional outcomes.    Time 4    Period Weeks    Status On-going    Target Date 11/30/20             PT Long Term Goals - 11/09/20 0902      PT LONG TERM GOAL #1   Title Patient will be able to walk at least 300 feet in 2 minutes to demonstrate improved ambulatory mobility    Baseline 225 ft    Time 8    Period Weeks    Status On-going      PT LONG TERM GOAL #2   Title Patient will be able to stand for at least 15 minutes without severe pain to improve ability to cook and meal prep    Baseline Reports being able to stand 5-10 min this morning to brush teeth and do hair    Time 8    Period Weeks    Status On-going      PT LONG TERM GOAL #3   Title Patient will report at least 75% improvement in overall symptoms and/or function to demonstrate improved functional mobility    Baseline Reports 50% improvement    Time 8    Period Weeks    Status On-going                 Plan - 11/16/20 0850    Clinical Impression Statement Continued with extension based exercises.  Pt  reports stiffness initially this session and little discomfort that improved with reps.  Required cueing to continue breathing through session, mechanics with squats and educated importance of abdominal strenghtening to support lower back.  Positive reports with  prone exercises.  Added prone UE raise for posterior chain/postural strengthening, tolerated well though did fatigue quickly due to weakness.    Personal Factors and Comorbidities Comorbidity 1;Comorbidity 2;Comorbidity 3+;Time since onset of injury/illness/exacerbation    Comorbidities shoulder pain, neck pain    Examination-Activity Limitations Transfers;Lift;Locomotion Level;Bend;Sit;Sleep;Squat;Stairs;Stand    Examination-Participation Restrictions Meal Prep;Laundry;Driving;Community Activity;Shop;Cleaning    Stability/Clinical Decision Making Stable/Uncomplicated    Clinical Decision Making Low    Rehab Potential Good    PT Frequency --   1-2x/week for total of 12 visits over 8 week certification   PT Duration 8 weeks    PT Treatment/Interventions ADLs/Self Care Home Management;Aquatic Therapy;Electrical Stimulation;Iontophoresis 4mg /ml Dexamethasone;Moist Heat;Traction;DME Instruction;Neuromuscular re-education;Patient/family education;Manual techniques;Therapeutic exercise;Therapeutic activities;Functional mobility training;Stair training;Gait training;Dry needling;Joint Manipulations;Passive range of motion    PT Next Visit Plan Review proper body mechanics to reduce injury with lifting mechanics as is helping move.  continue with Mckenzie extension.  Progress per tolerance.    PT Home Exercise Plan prone, lumbar support  3/8: heelsqueeze, press ups, hip excursions    Consulted and Agree with Plan of Care Patient           Patient will benefit from skilled therapeutic intervention in order to improve the following deficits and impairments:  Pain,Decreased mobility,Difficulty walking,Decreased range of motion,Decreased strength,Decreased activity tolerance,Decreased endurance,Postural dysfunction,Improper body mechanics  Visit Diagnosis: Difficulty in walking, not elsewhere classified  Muscle weakness (generalized)  Chronic midline low back pain with right-sided  sciatica     Problem List Patient Active Problem List   Diagnosis Date Noted  . S/P PTCA (percutaneous transluminal coronary angioplasty)   . NSTEMI (non-ST elevated myocardial infarction) ([place]) 09/21/2020  . Chest pain 09/20/2020  . Obesity (BMI 30.0-34.9) 09/20/2020  . Essential hypertension 09/20/2020  . Tobacco abuse 09/20/2020  . Elevated troponin 09/20/2020  . Pain in both upper extremities 06/26/2019  . Weakness 06/26/2019   Ihor Austin, LPTA/CLT; CBIS [phone]  Aldona Lento 11/16/2020, 9:23 AM  [facility] [address] [place], [place], [address] Phone: [phone]   Fax:  [phone]  Name: Dominique Hinton MRN: [id number] Date of Birth: Jun 20, 1971

## 2020-11-18 ENCOUNTER — Encounter (HOSPITAL_COMMUNITY): Payer: Self-pay | Admitting: Physical Therapy

## 2020-11-18 ENCOUNTER — Other Ambulatory Visit: Payer: Self-pay

## 2020-11-18 ENCOUNTER — Ambulatory Visit (HOSPITAL_COMMUNITY): Payer: Medicaid Other | Admitting: Physical Therapy

## 2020-11-18 DIAGNOSIS — M6281 Muscle weakness (generalized): Secondary | ICD-10-CM

## 2020-11-18 DIAGNOSIS — M5441 Lumbago with sciatica, right side: Secondary | ICD-10-CM | POA: Diagnosis not present

## 2020-11-18 DIAGNOSIS — G8929 Other chronic pain: Secondary | ICD-10-CM | POA: Diagnosis not present

## 2020-11-18 DIAGNOSIS — R262 Difficulty in walking, not elsewhere classified: Secondary | ICD-10-CM

## 2020-11-18 NOTE — Patient Instructions (Signed)
Access Code: [facility] URL: [email or web address] Date: 11/18/2020 Prepared by: Yetta Glassman  Exercises Cat Cow - 2 sets - 10 reps Supine Bilateral Hip Internal Rotation Stretch - 3 sets - 15 reps Bent Knee Fallouts - 3 sets - 15 reps Sidelying Thoracic Lumbar Rotation - 15 reps

## 2020-11-18 NOTE — Therapy (Signed)
[place], [place], [address] Phone: [phone]   Fax:  [phone]  Physical Therapy Treatment  Patient Details  Name: Dominique Hinton MRN: [id number] Date of Birth: 1970/09/12 Referring Provider (PT): Drema Pry   Encounter Date: 11/18/2020   PT End of Session - 11/18/20 0831    Visit Number 5    Number of Visits 12    Date for PT Re-Evaluation 12/28/20    Authorization Type [place] [employer]    Authorization Time Period (still pending as of 11/18/20)    Authorization - Visit Number 4    Authorization - Number of Visits 0    Progress Note Due on Visit 10    PT Start Time 0831    PT Stop Time 0915    PT Time Calculation (min) 44 min    Activity Tolerance Patient tolerated treatment well;Patient limited by pain    Behavior During Therapy [facility] for tasks assessed/performed           Past Medical History:  Diagnosis Date  . Anxiety   . Arthritis   . Cervical pain   . Family history of adverse reaction to anesthesia    " My Mother has a hard time waking up "  . Hypertension     Past Surgical History:  Procedure Laterality Date  . ABDOMINAL HYSTERECTOMY    . APPENDECTOMY    . BREAST SURGERY    . CARDIAC CATHETERIZATION  09/21/2020  . CHOLECYSTECTOMY    . CORONARY BALLOON ANGIOPLASTY  09/21/2020  . CORONARY/GRAFT ACUTE MI REVASCULARIZATION N/A 09/21/2020   Procedure: Coronary/Graft Acute MI Revascularization;  Surgeon: Jettie Booze, MD;  Location: [facility] [facility];  Service: Cardiovascular;  Laterality: N/A;  . LEFT HEART CATH AND CORONARY ANGIOGRAPHY N/A 09/21/2020   Procedure: LEFT HEART CATH AND CORONARY ANGIOGRAPHY;  Surgeon: Jettie Booze, MD;  Location: [facility];  Service: Cardiovascular;  Laterality: N/A;  . TUBAL LIGATION      There were no vitals filed for this visit.   Subjective Assessment - 11/18/20 0834    Subjective States she has good days and  bad days and today is not that bad with it at 2/10 still in lower back. Reports her exercises are going but she doesn't do them like she should because she has been busy moving.    Patient Stated Goals cleaning, able to stand and cook    Currently in Pain? Yes    Pain Score 2     Pain Location Back    Pain Orientation Lower    Pain Descriptors / Indicators Aching    Pain Type Chronic pain    Pain Onset More than a month ago              [facility] PT Assessment - 11/18/20 0001      Assessment   Medical Diagnosis LBP    Referring Provider (PT) Drema Pry                         [facility] Adult PT Treatment/Exercise - 11/18/20 0001      Lumbar Exercises: Stretches   Lower Trunk Rotation 2 reps   not tolerated to the left     Lumbar Exercises: Supine   Other Supine Lumbar Exercises bent knee fall outs and fall ins - 3x15 Each and bilateral    Other Supine Lumbar Exercises laying on lumbar roll -  3 minutes, glute squeeze x10 5" holds      Lumbar Exercises: Sidelying   Other Sidelying Lumbar Exercises book stretch x10 bilateral      Lumbar Exercises: Prone   Other Prone Lumbar Exercises prone press ups x15    Other Prone Lumbar Exercises hanging over table traction 2 minutes      Lumbar Exercises: Quadruped   Madcat/Old Horse 15 reps   2 sets, 5" holds                   PT Short Term Goals - 11/09/20 0902      PT SHORT TERM GOAL #1   Title Patient will report centralization of RLE with symptoms not extending beyond right buttock    Baseline posterior/lateral right knee    Time 4    Period Weeks    Status On-going    Target Date 11/30/20      PT SHORT TERM GOAL #2   Title Patient will be independent in self management strategies to improve quality of life and functional outcomes.    Time 4    Period Weeks    Status On-going    Target Date 11/30/20      PT SHORT TERM GOAL #3   Title Patient will report at least 50% improvement in overall  symptoms and/or function to demonstrate improved functional mobility    Time 4    Period Weeks    Status On-going    Target Date 11/30/20      PT SHORT TERM GOAL #4   Title Patient will be independent with HEP in order to improve functional outcomes.    Time 4    Period Weeks    Status On-going    Target Date 11/30/20             PT Long Term Goals - 11/09/20 0902      PT LONG TERM GOAL #1   Title Patient will be able to walk at least 300 feet in 2 minutes to demonstrate improved ambulatory mobility    Baseline 225 ft    Time 8    Period Weeks    Status On-going      PT LONG TERM GOAL #2   Title Patient will be able to stand for at least 15 minutes without severe pain to improve ability to cook and meal prep    Baseline Reports being able to stand 5-10 min this morning to brush teeth and do hair    Time 8    Period Weeks    Status On-going      PT LONG TERM GOAL #3   Title Patient will report at least 75% improvement in overall symptoms and/or function to demonstrate improved functional mobility    Baseline Reports 50% improvement    Time 8    Period Weeks    Status On-going                 Plan - 11/18/20 0835    Clinical Impression Statement Overall limited in some exercises today secondary to recent increase in thumb and wrist pain. able to perform exercises with slight modifications as needed (making fist instead of hand flat). Improved right hip internal rotation noted after bent knee fall ins and this exercise also stretched lumbar region where patient reported pain. Added to home program as this was tolerated well. Patient with increased stiffness and soreness noted end of session, difficult for patient to return to upright position,  reviewed traction over table. This improved symptoms and patient able to return to full upright position, instructed patient to perform multiple times a day for pain relief.    Personal Factors and Comorbidities Comorbidity  1;Comorbidity 2;Comorbidity 3+;Time since onset of injury/illness/exacerbation    Comorbidities shoulder pain, neck pain    Examination-Activity Limitations Transfers;Lift;Locomotion Level;Bend;Sit;Sleep;Squat;Stairs;Stand    Examination-Participation Restrictions Meal Prep;Laundry;Driving;Community Activity;Shop;Cleaning    Stability/Clinical Decision Making Stable/Uncomplicated    Rehab Potential Good    PT Frequency --   1-2x/week for total of 12 visits over 8 week certification   PT Duration 8 weeks    PT Treatment/Interventions ADLs/Self Care Home Management;Aquatic Therapy;Electrical Stimulation;Iontophoresis 4mg /ml Dexamethasone;Moist Heat;Traction;DME Instruction;Neuromuscular re-education;Patient/family education;Manual techniques;Therapeutic exercise;Therapeutic activities;Functional mobility training;Stair training;Gait training;Dry needling;Joint Manipulations;Passive range of motion    PT Next Visit Plan trial traction, isometrics and core strength, Review proper body mechanics to reduce injury with lifting mechanics as is helping move.  continue with Mckenzie extension.  Progress per tolerance.    PT Home Exercise Plan prone, lumbar support  3/8: heelsqueeze, press ups, hip excursions, 3/17 bent knee fall in//outs, cat/cow, book stretch    Consulted and Agree with Plan of Care Patient           Patient will benefit from skilled therapeutic intervention in order to improve the following deficits and impairments:  Pain,Decreased mobility,Difficulty walking,Decreased range of motion,Decreased strength,Decreased activity tolerance,Decreased endurance,Postural dysfunction,Improper body mechanics  Visit Diagnosis: Difficulty in walking, not elsewhere classified  Muscle weakness (generalized)  Chronic midline low back pain with right-sided sciatica     Problem List Patient Active Problem List   Diagnosis Date Noted  . S/P PTCA (percutaneous transluminal coronary angioplasty)    . NSTEMI (non-ST elevated myocardial infarction) ([place]) 09/21/2020  . Chest pain 09/20/2020  . Obesity (BMI 30.0-34.9) 09/20/2020  . Essential hypertension 09/20/2020  . Tobacco abuse 09/20/2020  . Elevated troponin 09/20/2020  . Pain in both upper extremities 06/26/2019  . Weakness 06/26/2019  9:15 AM, 11/18/20 Jerene Pitch, DPT Physical Therapy with [facility]  [phone] office  [place] [address] [place], [place], [address] Phone: [phone]   Fax:  [phone]  Name: Dominique Hinton MRN: [id number] Date of Birth: [birth year]

## 2020-11-23 ENCOUNTER — Other Ambulatory Visit: Payer: Self-pay

## 2020-11-23 ENCOUNTER — Ambulatory Visit (HOSPITAL_COMMUNITY): Payer: Medicaid Other

## 2020-11-23 ENCOUNTER — Encounter (HOSPITAL_COMMUNITY): Payer: Self-pay

## 2020-11-23 DIAGNOSIS — R262 Difficulty in walking, not elsewhere classified: Secondary | ICD-10-CM

## 2020-11-23 DIAGNOSIS — G8929 Other chronic pain: Secondary | ICD-10-CM

## 2020-11-23 DIAGNOSIS — M6281 Muscle weakness (generalized): Secondary | ICD-10-CM

## 2020-11-23 DIAGNOSIS — M5441 Lumbago with sciatica, right side: Secondary | ICD-10-CM | POA: Diagnosis not present

## 2020-11-23 NOTE — Therapy (Signed)
[place], [place], [address] Phone: [phone]   Fax:  [phone]  Physical Therapy Treatment  Patient Details  Name: Dominique Hinton MRN: [id number] Date of Birth: 05-15-1971 Referring Provider (PT): Drema Pry   Encounter Date: 11/23/2020   PT End of Session - 11/23/20 0839    Visit Number 6    Number of Visits 12    Date for PT Re-Evaluation 12/28/20    Authorization Type [place] [employer]    Authorization Time Period 12 visits approved from 3/1-->12/28/20    Authorization - Visit Number 6    Authorization - Number of Visits 12    Progress Note Due on Visit 10    PT Start Time 0834    PT Stop Time 0912    PT Time Calculation (min) 38 min    Activity Tolerance Patient tolerated treatment well    Behavior During Therapy [facility] for tasks assessed/performed           Past Medical History:  Diagnosis Date  . Anxiety   . Arthritis   . Cervical pain   . Family history of adverse reaction to anesthesia    " My Mother has a hard time waking up "  . Hypertension     Past Surgical History:  Procedure Laterality Date  . ABDOMINAL HYSTERECTOMY    . APPENDECTOMY    . BREAST SURGERY    . CARDIAC CATHETERIZATION  09/21/2020  . CHOLECYSTECTOMY    . CORONARY BALLOON ANGIOPLASTY  09/21/2020  . CORONARY/GRAFT ACUTE MI REVASCULARIZATION N/A 09/21/2020   Procedure: Coronary/Graft Acute MI Revascularization;  Surgeon: Jettie Booze, MD;  Location: [facility];  Service: Cardiovascular;  Laterality: N/A;  . LEFT HEART CATH AND CORONARY ANGIOGRAPHY N/A 09/21/2020   Procedure: LEFT HEART CATH AND CORONARY ANGIOGRAPHY;  Surgeon: Jettie Booze, MD;  Location: [facility];  Service: Cardiovascular;  Laterality: N/A;  . TUBAL LIGATION      There were no vitals filed for this visit.   Subjective Assessment - 11/23/20 0837    Subjective Pt reports she cleared off kitchen table and  has been doing the traction at home.  No reports of pain currently.  Stated her hands and wrist are bothering her some, stated increased pain lifting grandson 3 days a week.    Pertinent History history of neck and shoulder pain    Patient Stated Goals cleaning, able to stand and cook    Currently in Pain? No/denies                             [facility] Adult PT Treatment/Exercise - 11/23/20 0001      Lumbar Exercises: Stretches   Standing Extension 10 reps;10 seconds    Prone on Elbows Stretch Limitations 2 minutes    Press Ups 5 reps;10 seconds      Lumbar Exercises: Standing   Lifting From floor;From 12";5 reps    Lifting Limitations Educated proper lifting mechanics with 8# box    Other Standing Lumbar Exercises paloff partial tandem stance 4x 15 GTB      Lumbar Exercises: Supine   Large Ball Abdominal Isometric 10 reps;5 seconds      Lumbar Exercises: Prone   Other Prone Lumbar Exercises hanging over table traction 2 minutes      Lumbar Exercises: Quadruped   Madcat/Old Horse 10 reps   increased wrist discomfort,  ended early                   PT Short Term Goals - 11/09/20 0902      PT SHORT TERM GOAL #1   Title Patient will report centralization of RLE with symptoms not extending beyond right buttock    Baseline posterior/lateral right knee    Time 4    Period Weeks    Status On-going    Target Date 11/30/20      PT SHORT TERM GOAL #2   Title Patient will be independent in self management strategies to improve quality of life and functional outcomes.    Time 4    Period Weeks    Status On-going    Target Date 11/30/20      PT SHORT TERM GOAL #3   Title Patient will report at least 50% improvement in overall symptoms and/or function to demonstrate improved functional mobility    Time 4    Period Weeks    Status On-going    Target Date 11/30/20      PT SHORT TERM GOAL #4   Title Patient will be independent with HEP in order to improve  functional outcomes.    Time 4    Period Weeks    Status On-going    Target Date 11/30/20             PT Long Term Goals - 11/09/20 0902      PT LONG TERM GOAL #1   Title Patient will be able to walk at least 300 feet in 2 minutes to demonstrate improved ambulatory mobility    Baseline 225 ft    Time 8    Period Weeks    Status On-going      PT LONG TERM GOAL #2   Title Patient will be able to stand for at least 15 minutes without severe pain to improve ability to cook and meal prep    Baseline Reports being able to stand 5-10 min this morning to brush teeth and do hair    Time 8    Period Weeks    Status On-going      PT LONG TERM GOAL #3   Title Patient will report at least 75% improvement in overall symptoms and/or function to demonstrate improved functional mobility    Baseline Reports 50% improvement    Time 8    Period Weeks    Status On-going                 Plan - 11/23/20 0908    Clinical Impression Statement Continued with extension based exercises.  Added isometric core strengthening with min verbal and tactile cueing for core engagement that was tolerated well.  Reviewed proper lifting mechanics from floor.  Positive reports with self traction across table for pain control.    Personal Factors and Comorbidities Comorbidity 1;Comorbidity 2;Comorbidity 3+;Time since onset of injury/illness/exacerbation    Comorbidities shoulder pain, neck pain    Examination-Activity Limitations Transfers;Lift;Locomotion Level;Bend;Sit;Sleep;Squat;Stairs;Stand    Examination-Participation Restrictions Meal Prep;Laundry;Driving;Community Activity;Shop;Cleaning    Stability/Clinical Decision Making Stable/Uncomplicated    Clinical Decision Making Low    Rehab Potential Good    PT Duration 8 weeks    PT Treatment/Interventions ADLs/Self Care Home Management;Aquatic Therapy;Electrical Stimulation;Iontophoresis 4mg /ml Dexamethasone;Moist Heat;Traction;DME  Instruction;Neuromuscular re-education;Patient/family education;Manual techniques;Therapeutic exercise;Therapeutic activities;Functional mobility training;Stair training;Gait training;Dry needling;Joint Manipulations;Passive range of motion    PT Next Visit Plan trial traction, isometrics and core strength, Review proper body mechanics  to reduce injury with lifting mechanics as is helping move.  continue with Mckenzie extension.  Progress per tolerance.    PT Home Exercise Plan prone, lumbar support  3/8: heelsqueeze, press ups, hip excursions, 3/17 bent knee fall in//outs, cat/cow, book stretch           Patient will benefit from skilled therapeutic intervention in order to improve the following deficits and impairments:  Pain,Decreased mobility,Difficulty walking,Decreased range of motion,Decreased strength,Decreased activity tolerance,Decreased endurance,Postural dysfunction,Improper body mechanics  Visit Diagnosis: Difficulty in walking, not elsewhere classified  Muscle weakness (generalized)  Chronic midline low back pain with right-sided sciatica     Problem List Patient Active Problem List   Diagnosis Date Noted  . S/P PTCA (percutaneous transluminal coronary angioplasty)   . NSTEMI (non-ST elevated myocardial infarction) ([place]) 09/21/2020  . Chest pain 09/20/2020  . Obesity (BMI 30.0-34.9) 09/20/2020  . Essential hypertension 09/20/2020  . Tobacco abuse 09/20/2020  . Elevated troponin 09/20/2020  . Pain in both upper extremities 06/26/2019  . Weakness 06/26/2019   Ihor Austin, LPTA/CLT; CBIS [phone]  Aldona Lento 11/23/2020, [place] [address] [place], [place], [address] Phone: [phone]   Fax:  [phone]  Name: Dominique Hinton MRN: [id number] Date of Birth: 07-23-1971

## 2020-11-23 NOTE — Progress Notes (Signed)
Cardiology Office Note    Date:  11/29/2020   ID:  Dominique Hinton, DOB Apr 26, 1971, MRN [id number]   PCP:  Asencion Noble, [place]  Cardiologist:  Rozann Lesches, MD  Advanced Practice Provider:  No care team member to display Electrophysiologist:  None   [id number]}   Chief Complaint  Patient presents with  . Follow-up    History of Present Illness:  Dominique Hinton is a 50 y.o. female with history of hypertension and tobacco abuse presented with NSTEMI 09/2020 and found to have a totally occluded diagonal 1 and underwent balloon angioplasty, post intervention with 25% residual stenosis but vessel too small for stenting.  Placed on aspirin and Plavix.  Works at [employer] not working.   I saw the patient in follow-up 09/29/2020 at which time she had a lot of stress and anxiety.  Was still smoking drinking a lot of [facility] and smoking marijuana as well.  Blood pressure was running low at home so I decreased HCTZ 25 mg to half a tablet daily and referred her to behavioral health counselor.  Patient comes in for f/u. Has cut back on smoking. Under a lot of stress-having to move in with her mother whose developed dementia. BP doing better-still has occasional dizziness but not as bad as it was. When she gets anxious she gets some chest pain but different from MI pan. Still drinking a lot of Mountain Dew-at least 1 L/day. Doing PT and on her feet all day with her mother and also cares for her grandson. Had to go back on meloxicam because of arthritis. Having trouble with seasonal allergies-discussed treatments.    Past Medical History:  Diagnosis Date  . Anxiety   . Arthritis   . Cervical pain   . Family history of adverse reaction to anesthesia    " My Mother has a hard time waking up "  . Hypertension     Past Surgical History:  Procedure Laterality Date  . ABDOMINAL HYSTERECTOMY    . APPENDECTOMY    . BREAST SURGERY    .  CARDIAC CATHETERIZATION  09/21/2020  . CHOLECYSTECTOMY    . CORONARY BALLOON ANGIOPLASTY  09/21/2020  . CORONARY/GRAFT ACUTE MI REVASCULARIZATION N/A 09/21/2020   Procedure: Coronary/Graft Acute MI Revascularization;  Surgeon: Jettie Booze, MD;  Location: [facility];  Service: Cardiovascular;  Laterality: N/A;  . LEFT HEART CATH AND CORONARY ANGIOGRAPHY N/A 09/21/2020   Procedure: LEFT HEART CATH AND CORONARY ANGIOGRAPHY;  Surgeon: Jettie Booze, MD;  Location: [facility];  Service: Cardiovascular;  Laterality: N/A;  . TUBAL LIGATION      Current Medications: Current Meds  Medication Sig  . aspirin 81 MG chewable tablet Chew 1 tablet (81 mg total) by mouth daily.  . citalopram (CELEXA) 20 MG tablet Take 20 mg by mouth daily.  . DULoxetine (CYMBALTA) 60 MG capsule Take 1 capsule (60 mg total) by mouth daily.  Marland Kitchen gabapentin (NEURONTIN) 300 MG capsule Take 300 mg by mouth 2 (two) times daily.  . hydrochlorothiazide (HYDRODIURIL) 25 MG tablet Take 0.5 tablets (12.5 mg total) by mouth daily.  Marland Kitchen HYDROcodone-acetaminophen (NORCO) 10-325 MG tablet Take 1 tablet by mouth 4 (four) times daily as needed for pain. Only at night prn  . losartan (COZAAR) 100 MG tablet Take 100 mg by mouth daily.  . meloxicam (MOBIC) 15 MG tablet Take 15 mg by mouth. Monday Wednesday and Friday- whole  tablet Sat/Sun -Half tablet  . nicotine (NICODERM CQ - DOSED IN MG/24 HOURS) 14 mg/24hr patch Place 1 patch (14 mg total) onto the skin daily.  . nicotine (NICODERM CQ - DOSED IN MG/24 HR) 7 mg/24hr patch Place 1 patch (7 mg total) onto the skin daily.  . nitroGLYCERIN (NITROSTAT) 0.4 MG SL tablet Place 1 tablet (0.4 mg total) under the tongue every 5 (five) minutes as needed for chest pain.  . pantoprazole (PROTONIX) 40 MG tablet Take 1 tablet (40 mg total) by mouth daily.  . rosuvastatin (CRESTOR) 20 MG tablet Take 1 tablet (20 mg total) by mouth daily at 6 PM.  . ticagrelor (BRILINTA) 90 MG  TABS tablet Take 1 tablet (90 mg total) by mouth 2 (two) times daily.  Marland Kitchen tiZANidine (ZANAFLEX) 4 MG tablet Take 4-8 mg by mouth 3 (three) times daily as needed for muscle spasms.  . valACYclovir (VALTREX) 1000 MG tablet Take 1,000 mg by mouth daily.     Allergies:   Cortisone and Doxycycline   Social History   Socioeconomic History  . Marital status: Legally Separated    Spouse name: Not on file  . Number of children: 3  . Years of education: 62  . Highest education level: Not on file  Occupational History  . Occupation: waitress/cook  Tobacco Use  . Smoking status: Current Every Day Smoker    Packs/day: 1.00    Types: Cigarettes  . Smokeless tobacco: Never Used  Vaping Use  . Vaping Use: Never used  Substance and Sexual Activity  . Alcohol use: Yes    Comment: occasionally  . Drug use: Yes    Types: Marijuana    Comment: occasionally  . Sexual activity: Yes    Birth control/protection: None  Other Topics Concern  . Not on file  Social History Narrative   Lives at home with daughter, Dominique Hinton and fiance.   10-12 cups caffeine per day.   Right-handed.   Social Determinants of Health   Financial Resource Strain: Not on file  Food Insecurity: Not on file  Transportation Needs: Not on file  Physical Activity: Not on file  Stress: Not on file  Social Connections: Not on file     Family History:  The patient's family history includes Alzheimer's disease in her father; Hypertension in an other family member; Other in her mother.   ROS:   Please see the history of present illness.    ROS All other systems reviewed and are negative.   PHYSICAL EXAM:   VS:  BP 128/82   Pulse 72   Ht 5\' 6"  (1.676 m)   Wt 184 lb 9.6 oz (83.7 kg)   SpO2 98%   BMI 29.80 kg/m   Physical Exam  GEN: Well nourished, well developed, in no acute distress  Neck: no JVD, carotid bruits, or masses Cardiac:RRR; no murmurs, rubs, or gallops  Respiratory:  Decreased breath sounds but clear to  auscultation bilaterally, normal work of breathing GI: soft, nontender, nondistended, + BS Ext: without cyanosis, clubbing, or edema, Good distal pulses bilaterally Neuro:  Alert and Oriented x 3 Psych:anxious, full affect  Wt Readings from Last 3 Encounters:  11/29/20 184 lb 9.6 oz (83.7 kg)  09/29/20 185 lb (83.9 kg)  09/22/20 187 lb 9.6 oz (85.1 kg)      Studies/Labs Reviewed:   EKG:  EKG is not ordered today.   Recent Labs: 09/21/2020: ALT 54; Magnesium 2.0 09/22/2020: BUN 7; Creatinine, Ser 0.79; Hemoglobin 14.9; Platelets  261; Potassium 3.7; Sodium 136   Lipid Panel    Component Value Date/Time   CHOL 185 09/22/2020 0301   TRIG 179 (H) 09/22/2020 0301   HDL 24 (L) 09/22/2020 0301   CHOLHDL 7.7 09/22/2020 0301   VLDL 36 09/22/2020 0301   LDLCALC 125 (H) 09/22/2020 0301    Additional studies/ records that were reviewed today include:  Cath: 09/21/20    1st Mrg lesion is 75% stenosed. This is a very small vessel.  Lat 1st Diag lesion is 100% stenosed. Given the wall motion abnormality on ventriculogram, we decided to intervene.  Balloon angioplasty was performed using a BALLOON SAPPHIRE 1.25X10. It turned out to be a small vessel.  Post intervention, there is a 25% residual stenosis. The vessel was too small for stenting.  The left ventricular systolic function is normal.  LV end diastolic pressure is normal.  The left ventricular ejection fraction is 50-55% by visual estimate.  There is no aortic valve stenosis.   Continue aggressive medical therapy with DAPT.  Could change Brilinta to Plavix since no stent was placed.    She needs to stop smoking.  Healthy diet, BP and blood sugar control would also be helpful.           Risk Assessment/Calculations:         ASSESSMENT:    1. Coronary artery disease involving native coronary artery of native heart without angina pectoris   2. Essential hypertension   3. Hyperlipidemia, unspecified hyperlipidemia  type   4. Tobacco abuse   5. Chronic low back pain, unspecified back pain laterality, unspecified whether sciatica present   6. Anxiety      PLAN:  In order of problems listed above:  CAD status post NSTEMI treated with balloon angioplasty to diagonal 1 2 small for stenting.  On DAPT with Brilinta and aspirin. Recommend lifestyle modifications including dietary changes, smoking cessation, exercise and decrease stress and anxiety. Hasn't seen behavioral health yet which I think will help   Hypertension BP was running low at home so I decreased HCTZ 25 mg 1/2 tablet daily.    Blood pressure doing better.  She only has occasional dizziness when she stands up.  She has not been checking her blood pressure regularly because of the move.   Hyperlipidemia on crestor 20 mg daily will need fasting lipid panel in 3 months   Tobacco abuse  Has cut back.  Smoking cessation discussed.  I prescribed nicotine patches but she has not started.   Chronic back pain on Mobic need to monitor closely for occult bleeding with need for DAPT-to discuss with Dr. Willey Blade trying to take M/W/F and 1/2 tablet sat/sun. May need to see Rhematologist   Anxiety-have refered for counseling here at behavioral health   Shared Decision Making/Informed Consent        Medication Adjustments/Labs and Tests Ordered: Current medicines are reviewed at length with the patient today.  Concerns regarding medicines are outlined above.  Medication changes, Labs and Tests ordered today are listed in the Patient Instructions below. Patient Instructions   Medication Instructions:  Your physician recommends that you continue on your current medications as directed. Please refer to the Current Medication list given to you today.  Cut back on drinking [place] Dew  *If you need a refill on your cardiac medications before your next appointment, please call your pharmacy*   Lab Work: NONE   If you have labs (blood work) drawn today  and your tests are  completely normal, you will receive your results only by: Marland Kitchen MyChart Message (if you have MyChart) OR . A paper copy in the mail If you have any lab test that is abnormal or we need to change your treatment, we will call you to review the results.   Testing/Procedures: NONE    Follow-Up: At [facility], you and your health needs are our priority.  As part of our continuing mission to provide you with exceptional heart care, we have created designated Provider Care Teams.  These Care Teams include your primary Cardiologist (physician) and Advanced Practice Providers (APPs -  Physician Assistants and Nurse Practitioners) who all work together to provide you with the care you need, when you need it.  We recommend signing up for the patient portal called "MyChart".  Sign up information is provided on this After Visit Summary.  MyChart is used to connect with patients for Virtual Visits (Telemedicine).  Patients are able to view lab/test results, encounter notes, upcoming appointments, etc.  Non-urgent messages can be sent to your provider as well.   To learn more about what you can do with MyChart, go to [email or web address].    Your next appointment:   2 month(s)  The format for your next appointment:   In Person  Provider:   Rozann Lesches, MD   Other Instructions Thank you for choosing [facility]!   Steps to Quit Smoking Smoking tobacco is the leading cause of preventable death. It can affect almost every organ in the body. Smoking puts you and those around you at risk for developing many serious chronic diseases. Quitting smoking can be difficult, but it is one of the best things that you can do for your health. It is never too late to quit. How do I get ready to quit? When you decide to quit smoking, create a plan to help you succeed. Before you quit:  Pick a date to quit. Set a date within the next 2 weeks to give you time to prepare.  Write  down the reasons why you are quitting. Keep this list in places where you will see it often.  Tell your family, friends, and co-workers that you are quitting. Support from your loved ones can make quitting easier.  Talk with your health care provider about your options for quitting smoking.  Find out what treatment options are covered by your health insurance.  Identify people, places, things, and activities that make you want to smoke (triggers). Avoid them. What first steps can I take to quit smoking?  Throw away all cigarettes at home, at work, and in your car.  Throw away smoking accessories, such as Scientist, research (medical).  Clean your car. Make sure to empty the ashtray.  Clean your home, including curtains and carpets. What strategies can I use to quit smoking? Talk with your health care provider about combining strategies, such as taking medicines while you are also receiving in-person counseling. Using these two strategies together makes you more likely to succeed in quitting than if you used either strategy on its own.  If you are pregnant or breastfeeding, talk with your health care provider about finding counseling or other support strategies to quit smoking. Do not take medicine to help you quit smoking unless your health care provider tells you to do so. To quit smoking: Quit right away  Quit smoking completely, instead of gradually reducing how much you smoke over a period of time. Research shows that stopping smoking right away  is more successful than gradually quitting.  Attend in-person counseling to help you build problem-solving skills. You are more likely to succeed in quitting if you attend counseling sessions regularly. Even short sessions of 10 minutes can be effective. Take medicine You may take medicines to help you quit smoking. Some medicines require a prescription and some you can purchase over-the-counter. Medicines may have nicotine in them to replace the  nicotine in cigarettes. Medicines may:  Help to stop cravings.  Help to relieve withdrawal symptoms. Your health care provider may recommend:  Nicotine patches, gum, or lozenges.  Nicotine inhalers or sprays.  Non-nicotine medicine that is taken by mouth. Find resources Find resources and support systems that can help you to quit smoking and remain smoke-free after you quit. These resources are most helpful when you use them often. They include:  Online chats with a Social worker.  Telephone quitlines.  Printed Furniture conservator/restorer.  Support groups or group counseling.  Text messaging programs.  Mobile phone apps or applications. Use apps that can help you stick to your quit plan by providing reminders, tips, and encouragement. There are many free apps for mobile devices as well as websites. Examples include Quit Guide from the [employer] and smokefree.gov   What things can I do to make it easier to quit?  Reach out to your family and friends for support and encouragement. Call telephone quitlines (1-800-QUIT-NOW), reach out to support groups, or work with a counselor for support.  Ask people who smoke to avoid smoking around you.  Avoid places that trigger you to smoke, such as bars, parties, or smoke-break areas at work.  Spend time with people who do not smoke.  Lessen the stress in your life. Stress can be a smoking trigger for some people. To lessen stress, try: ? Exercising regularly. ? Doing deep-breathing exercises. ? Doing yoga. ? Meditating. ? Performing a body scan. This involves closing your eyes, scanning your body from head to toe, and noticing which parts of your body are particularly tense. Try to relax the muscles in those areas.   How will I feel when I quit smoking? Day 1 to 3 weeks Within the first 24 hours of quitting smoking, you may start to feel withdrawal symptoms. These symptoms are usually most noticeable 2-3 days after quitting, but they usually do not last  for more than 2-3 weeks. You may experience these symptoms:  Mood swings.  Restlessness, anxiety, or irritability.  Trouble concentrating.  Dizziness.  Strong cravings for sugary foods and nicotine.  Mild weight gain.  Constipation.  Nausea.  Coughing or a sore throat.  Changes in how the medicines that you take for unrelated issues work in your body.  Depression.  Trouble sleeping (insomnia). Week 3 and afterward After the first 2-3 weeks of quitting, you may start to notice more positive results, such as:  Improved sense of smell and taste.  Decreased coughing and sore throat.  Slower heart rate.  Lower blood pressure.  Clearer skin.  The ability to breathe more easily.  Fewer sick days. Quitting smoking can be very challenging. Do not get discouraged if you are not successful the first time. Some people need to make many attempts to quit before they achieve long-term success. Do your best to stick to your quit plan, and talk with your health care provider if you have any questions or concerns. Summary  Smoking tobacco is the leading cause of preventable death. Quitting smoking is one of the best things that  you can do for your health.  When you decide to quit smoking, create a plan to help you succeed.  Quit smoking right away, not slowly over a period of time.  When you start quitting, seek help from your health care provider, family, or friends. This information is not intended to replace advice given to you by your health care provider. Make sure you discuss any questions you have with your health care provider. Document Revised: 05/16/2019 Document Reviewed: 11/09/2018 Elsevier Patient Education  2021 [place], Ermalinda Barrios, [place]  11/29/2020 11:32 AM    [address], [place], [place]  [address] Phone: [phone]; Fax: [phone]

## 2020-11-25 ENCOUNTER — Other Ambulatory Visit: Payer: Self-pay

## 2020-11-25 ENCOUNTER — Encounter (HOSPITAL_COMMUNITY): Payer: Self-pay

## 2020-11-25 ENCOUNTER — Ambulatory Visit (HOSPITAL_COMMUNITY): Payer: Medicaid Other

## 2020-11-25 DIAGNOSIS — M6281 Muscle weakness (generalized): Secondary | ICD-10-CM

## 2020-11-25 DIAGNOSIS — M5441 Lumbago with sciatica, right side: Secondary | ICD-10-CM | POA: Diagnosis not present

## 2020-11-25 DIAGNOSIS — R262 Difficulty in walking, not elsewhere classified: Secondary | ICD-10-CM

## 2020-11-25 DIAGNOSIS — G8929 Other chronic pain: Secondary | ICD-10-CM

## 2020-11-25 DIAGNOSIS — M47816 Spondylosis without myelopathy or radiculopathy, lumbar region: Secondary | ICD-10-CM | POA: Diagnosis not present

## 2020-11-25 NOTE — Therapy (Signed)
[place], [place], [address] Phone: [phone]   Fax:  [phone]  Physical Therapy Treatment  Patient Details  Name: AHLEAH SIMKO MRN: [id number] Date of Birth: [birth year] Referring Provider (PT): Drema Pry   Encounter Date: 11/25/2020   PT End of Session - 11/25/20 0839    Visit Number 7    Number of Visits 12    Date for PT Re-Evaluation 12/28/20    Authorization Type [place] Medicaid [employer]    Authorization Time Period 12 visits approved from 3/1-->12/28/20    Authorization - Visit Number 7    Authorization - Number of Visits 12    Progress Note Due on Visit 10    PT Start Time 0834    PT Stop Time 0915    PT Time Calculation (min) 41 min    Activity Tolerance Patient tolerated treatment well    Behavior During Therapy [facility] for tasks assessed/performed           Past Medical History:  Diagnosis Date  . Anxiety   . Arthritis   . Cervical pain   . Family history of adverse reaction to anesthesia    " My Mother has a hard time waking up "  . Hypertension     Past Surgical History:  Procedure Laterality Date  . ABDOMINAL HYSTERECTOMY    . APPENDECTOMY    . BREAST SURGERY    . CARDIAC CATHETERIZATION  09/21/2020  . CHOLECYSTECTOMY    . CORONARY BALLOON ANGIOPLASTY  09/21/2020  . CORONARY/GRAFT ACUTE MI REVASCULARIZATION N/A 09/21/2020   Procedure: Coronary/Graft Acute MI Revascularization;  Surgeon: Jettie Booze, MD;  Location: [place] [facility];  Service: Cardiovascular;  Laterality: N/A;  . LEFT HEART CATH AND CORONARY ANGIOGRAPHY N/A 09/21/2020   Procedure: LEFT HEART CATH AND CORONARY ANGIOGRAPHY;  Surgeon: Jettie Booze, MD;  Location: [place] [facility];  Service: Cardiovascular;  Laterality: N/A;  . TUBAL LIGATION      There were no vitals filed for this visit.   Subjective Assessment - 11/25/20 0837    Subjective Reports increased pain standing to sign in  today.  Pain scale 2/10 LBP today.    Pertinent History history of neck and shoulder pain    Patient Stated Goals cleaning, able to stand and cook    Currently in Pain? Yes    Pain Score 2     Pain Location Back    Pain Orientation Lower    Pain Descriptors / Indicators Aching    Pain Type Chronic pain    Pain Onset More than a month ago    Pain Frequency Intermittent    Aggravating Factors  prolonged sitting or standing/stress    Pain Relieving Factors rest, medication, position change                             [facility] Adult PT Treatment/Exercise - 11/25/20 0001      Lumbar Exercises: Stretches   Standing Extension 10 reps;10 seconds    Standing Extension Limitations 3D hip excursion    Prone on Elbows Stretch Limitations 2 minutes    Press Ups 5 reps;10 seconds      Lumbar Exercises: Standing   Functional Squats 10 reps    Functional Squats Limitations 3D hip excursion, cueing for mechanics with squats and pain free range wiht extension    Shoulder Extension 10 reps;Theraband  Shoulder Extension Limitations marching with shoulder extension    Other Standing Lumbar Exercises paloff partial tandem stance 4x 15 GTB    Other Standing Lumbar Exercises vector stance 3x5"      Lumbar Exercises: Prone   Other Prone Lumbar Exercises hanging over table traction 2 minutes                  PT Education - 11/25/20 0900    Education Details Reviewed goals prior MD apt. Discussed smoking cessassation            PT Short Term Goals - 11/25/20 5784      PT SHORT TERM GOAL #1   Title Patient will report centralization of RLE with symptoms not extending beyond right buttock    Baseline 11/25/20: 95% of the time to knee or less; was posterior/lateral right knee      PT SHORT TERM GOAL #2   Title Patient will be independent in self management strategies to improve quality of life and functional outcomes.    Baseline 11/25/20:  Reports compliance iwht HEP and  self traction multiple times a day for pain relief;    Status On-going      PT SHORT TERM GOAL #3   Title Patient will report at least 50% improvement in overall symptoms and/or function to demonstrate improved functional mobility    Baseline 11/25/20:50% improvements, increased pain wiht moving homes.  Reports 50% improved    Status On-going      PT SHORT TERM GOAL #4   Title Patient will be independent with HEP in order to improve functional outcomes.    Baseline 11/25/20: HEP daily    Status Achieved             PT Long Term Goals - 11/25/20 0856      PT LONG TERM GOAL #1   Title Patient will be able to walk at least 300 feet in 2 minutes to demonstrate improved ambulatory mobility    Baseline 45ft was 230ft    Status Achieved      PT LONG TERM GOAL #2   Title Patient will be able to stand for at least 15 minutes without severe pain to improve ability to cook and meal prep    Baseline 11/25/20: REports standing for 5 minutes at check-in today wiht increased pain; was:  Reports being able to stand 5-10 min this morning to brush teeth and do hair    Status On-going      PT LONG TERM GOAL #3   Title Patient will report at least 75% improvement in overall symptoms and/or function to demonstrate improved functional mobility    Baseline Reports 50% improvement    Status On-going                 Plan - 11/25/20 6962    Clinical Impression Statement Progressed standing tolerance with additional core and balance activities that was tolerated well, limited by fatigue due to gluteal and core weakness.  Reviewed goals prior MD apt later today and progressing well.  Increased distance wiht 2MWT.  Continues to be limited with standing tolerance.  Noted increased SOB following 2MWT, pt reports she smokes 3/4 pack of cigarretes daily.  Discussed smoking cessation and link to LBP, given information for classes to assist with stopping smoking.    Personal Factors and Comorbidities  Comorbidity 1;Comorbidity 2;Comorbidity 3+;Time since onset of injury/illness/exacerbation    Comorbidities shoulder pain, neck pain    Examination-Activity Limitations Transfers;Lift;Locomotion  Level;Bend;Sit;Sleep;Squat;Stairs;Stand    Examination-Participation Restrictions Meal Prep;Laundry;Driving;Community Activity;Shop;Cleaning    Stability/Clinical Decision Making Stable/Uncomplicated    Clinical Decision Making Low    Rehab Potential Good    PT Frequency --   1-2x/week for total of 12 visits over 8 week certification   PT Duration 8 weeks    PT Treatment/Interventions ADLs/Self Care Home Management;Aquatic Therapy;Electrical Stimulation;Iontophoresis 4mg /ml Dexamethasone;Moist Heat;Traction;DME Instruction;Neuromuscular re-education;Patient/family education;Manual techniques;Therapeutic exercise;Therapeutic activities;Functional mobility training;Stair training;Gait training;Dry needling;Joint Manipulations;Passive range of motion    PT Next Visit Plan F/u with MD apt.  trial traction, isometrics and core strength, Review proper body mechanics to reduce injury with lifting mechanics as is helping move.  continue with Mckenzie extension.  Progress per tolerance.    PT Home Exercise Plan prone, lumbar support  3/8: heelsqueeze, press ups, hip excursions, 3/17 bent knee fall in//outs, cat/cow, book stretch    Consulted and Agree with Plan of Care Patient           Patient will benefit from skilled therapeutic intervention in order to improve the following deficits and impairments:  Pain,Decreased mobility,Difficulty walking,Decreased range of motion,Decreased strength,Decreased activity tolerance,Decreased endurance,Postural dysfunction,Improper body mechanics  Visit Diagnosis: Difficulty in walking, not elsewhere classified  Muscle weakness (generalized)  Chronic midline low back pain with right-sided sciatica     Problem List Patient Active Problem List   Diagnosis Date Noted   . S/P PTCA (percutaneous transluminal coronary angioplasty)   . NSTEMI (non-ST elevated myocardial infarction) ([place]) 09/21/2020  . Chest pain 09/20/2020  . Obesity (BMI 30.0-34.9) 09/20/2020  . Essential hypertension 09/20/2020  . Tobacco abuse 09/20/2020  . Elevated troponin 09/20/2020  . Pain in both upper extremities 06/26/2019  . Weakness 06/26/2019   Ihor Austin, LPTA/CLT; CBIS [phone]  Aldona Lento 11/25/2020, 9:23 AM  [facility] [address] [place], [place], [address] Phone: [phone]   Fax:  [phone]  Name: MEDHA PIPPEN MRN: [id number] Date of Birth: Jul 05, 1971

## 2020-11-29 ENCOUNTER — Ambulatory Visit: Payer: Medicaid Other | Admitting: Physician Assistant

## 2020-11-29 ENCOUNTER — Encounter: Payer: Self-pay | Admitting: Physician Assistant

## 2020-11-29 ENCOUNTER — Other Ambulatory Visit: Payer: Self-pay

## 2020-11-29 VITALS — BP 128/82 | HR 72 | Ht 66.0 in | Wt 184.6 lb

## 2020-11-29 DIAGNOSIS — I2510 Atherosclerotic heart disease of native coronary artery without angina pectoris: Secondary | ICD-10-CM | POA: Diagnosis not present

## 2020-11-29 DIAGNOSIS — I10 Essential (primary) hypertension: Secondary | ICD-10-CM

## 2020-11-29 DIAGNOSIS — G8929 Other chronic pain: Secondary | ICD-10-CM | POA: Diagnosis not present

## 2020-11-29 DIAGNOSIS — E785 Hyperlipidemia, unspecified: Secondary | ICD-10-CM | POA: Diagnosis not present

## 2020-11-29 DIAGNOSIS — Z720 Tobacco use: Secondary | ICD-10-CM

## 2020-11-29 DIAGNOSIS — M545 Low back pain, unspecified: Secondary | ICD-10-CM | POA: Diagnosis not present

## 2020-11-29 DIAGNOSIS — F419 Anxiety disorder, unspecified: Secondary | ICD-10-CM | POA: Diagnosis not present

## 2020-11-29 NOTE — Patient Instructions (Signed)
Medication Instructions:  Your physician recommends that you continue on your current medications as directed. Please refer to the Current Medication list given to you today.  Cut back on drinking [place] Dew  *If you need a refill on your cardiac medications before your next appointment, please call your pharmacy*   Lab Work: NONE   If you have labs (blood work) drawn today and your tests are completely normal, you will receive your results only by: Marland Kitchen MyChart Message (if you have MyChart) OR . A paper copy in the mail If you have any lab test that is abnormal or we need to change your treatment, we will call you to review the results.   Testing/Procedures: NONE    Follow-Up: At [facility], you and your health needs are our priority.  As part of our continuing mission to provide you with exceptional heart care, we have created designated Provider Care Teams.  These Care Teams include your primary Cardiologist (physician) and Advanced Practice Providers (APPs -  Physician Assistants and Nurse Practitioners) who all work together to provide you with the care you need, when you need it.  We recommend signing up for the patient portal called "MyChart".  Sign up information is provided on this After Visit Summary.  MyChart is used to connect with patients for Virtual Visits (Telemedicine).  Patients are able to view lab/test results, encounter notes, upcoming appointments, etc.  Non-urgent messages can be sent to your provider as well.   To learn more about what you can do with MyChart, go to [email or web address].    Your next appointment:   2 month(s)  The format for your next appointment:   In Person  Provider:   Rozann Lesches, MD   Other Instructions Thank you for choosing [facility]!   Steps to Quit Smoking Smoking tobacco is the leading cause of preventable death. It can affect almost every organ in the body. Smoking puts you and those around you at risk  for developing many serious chronic diseases. Quitting smoking can be difficult, but it is one of the best things that you can do for your health. It is never too late to quit. How do I get ready to quit? When you decide to quit smoking, create a plan to help you succeed. Before you quit:  Pick a date to quit. Set a date within the next 2 weeks to give you time to prepare.  Write down the reasons why you are quitting. Keep this list in places where you will see it often.  Tell your family, friends, and co-workers that you are quitting. Support from your loved ones can make quitting easier.  Talk with your health care provider about your options for quitting smoking.  Find out what treatment options are covered by your health insurance.  Identify people, places, things, and activities that make you want to smoke (triggers). Avoid them. What first steps can I take to quit smoking?  Throw away all cigarettes at home, at work, and in your car.  Throw away smoking accessories, such as Scientist, research (medical).  Clean your car. Make sure to empty the ashtray.  Clean your home, including curtains and carpets. What strategies can I use to quit smoking? Talk with your health care provider about combining strategies, such as taking medicines while you are also receiving in-person counseling. Using these two strategies together makes you more likely to succeed in quitting than if you used either strategy on its own.  If  you are pregnant or breastfeeding, talk with your health care provider about finding counseling or other support strategies to quit smoking. Do not take medicine to help you quit smoking unless your health care provider tells you to do so. To quit smoking: Quit right away  Quit smoking completely, instead of gradually reducing how much you smoke over a period of time. Research shows that stopping smoking right away is more successful than gradually quitting.  Attend in-person  counseling to help you build problem-solving skills. You are more likely to succeed in quitting if you attend counseling sessions regularly. Even short sessions of 10 minutes can be effective. Take medicine You may take medicines to help you quit smoking. Some medicines require a prescription and some you can purchase over-the-counter. Medicines may have nicotine in them to replace the nicotine in cigarettes. Medicines may:  Help to stop cravings.  Help to relieve withdrawal symptoms. Your health care provider may recommend:  Nicotine patches, gum, or lozenges.  Nicotine inhalers or sprays.  Non-nicotine medicine that is taken by mouth. Find resources Find resources and support systems that can help you to quit smoking and remain smoke-free after you quit. These resources are most helpful when you use them often. They include:  Online chats with a Social worker.  Telephone quitlines.  Printed Furniture conservator/restorer.  Support groups or group counseling.  Text messaging programs.  Mobile phone apps or applications. Use apps that can help you stick to your quit plan by providing reminders, tips, and encouragement. There are many free apps for mobile devices as well as websites. Examples include Quit Guide from the [employer] and smokefree.gov   What things can I do to make it easier to quit?  Reach out to your family and friends for support and encouragement. Call telephone quitlines (1-800-QUIT-NOW), reach out to support groups, or work with a counselor for support.  Ask people who smoke to avoid smoking around you.  Avoid places that trigger you to smoke, such as bars, parties, or smoke-break areas at work.  Spend time with people who do not smoke.  Lessen the stress in your life. Stress can be a smoking trigger for some people. To lessen stress, try: ? Exercising regularly. ? Doing deep-breathing exercises. ? Doing yoga. ? Meditating. ? Performing a body scan. This involves closing your  eyes, scanning your body from head to toe, and noticing which parts of your body are particularly tense. Try to relax the muscles in those areas.   How will I feel when I quit smoking? Day 1 to 3 weeks Within the first 24 hours of quitting smoking, you may start to feel withdrawal symptoms. These symptoms are usually most noticeable 2-3 days after quitting, but they usually do not last for more than 2-3 weeks. You may experience these symptoms:  Mood swings.  Restlessness, anxiety, or irritability.  Trouble concentrating.  Dizziness.  Strong cravings for sugary foods and nicotine.  Mild weight gain.  Constipation.  Nausea.  Coughing or a sore throat.  Changes in how the medicines that you take for unrelated issues work in your body.  Depression.  Trouble sleeping (insomnia). Week 3 and afterward After the first 2-3 weeks of quitting, you may start to notice more positive results, such as:  Improved sense of smell and taste.  Decreased coughing and sore throat.  Slower heart rate.  Lower blood pressure.  Clearer skin.  The ability to breathe more easily.  Fewer sick days. Quitting smoking can be very  challenging. Do not get discouraged if you are not successful the first time. Some people need to make many attempts to quit before they achieve long-term success. Do your best to stick to your quit plan, and talk with your health care provider if you have any questions or concerns. Summary  Smoking tobacco is the leading cause of preventable death. Quitting smoking is one of the best things that you can do for your health.  When you decide to quit smoking, create a plan to help you succeed.  Quit smoking right away, not slowly over a period of time.  When you start quitting, seek help from your health care provider, family, or friends. This information is not intended to replace advice given to you by your health care provider. Make sure you discuss any questions you  have with your health care provider. Document Revised: 05/16/2019 Document Reviewed: 11/09/2018 Elsevier Patient Education  [place].

## 2020-11-30 ENCOUNTER — Ambulatory Visit (HOSPITAL_COMMUNITY): Payer: Medicaid Other | Admitting: Physical Therapy

## 2020-11-30 ENCOUNTER — Telehealth (HOSPITAL_COMMUNITY): Payer: Self-pay | Admitting: Physical Therapy

## 2020-11-30 NOTE — Telephone Encounter (Signed)
No Show. Called and left voicemail about missed appointment and about future appointment.  8:57 AM, 11/30/20 Jerene Pitch, DPT Physical Therapy with [facility]  [phone] office

## 2020-12-02 ENCOUNTER — Ambulatory Visit (HOSPITAL_COMMUNITY): Payer: Medicaid Other

## 2020-12-03 ENCOUNTER — Telehealth: Payer: Self-pay | Admitting: Internal Medicine

## 2020-12-03 NOTE — Telephone Encounter (Signed)
2nd attempt to reach Dominique Hinton to get her scheduled for a phone visit with the Managed Medicaid team. I left my name and number on her VM.

## 2020-12-07 ENCOUNTER — Ambulatory Visit (HOSPITAL_COMMUNITY): Payer: Medicaid Other | Attending: Physical Medicine & Rehabilitation | Admitting: Physical Therapy

## 2020-12-07 ENCOUNTER — Other Ambulatory Visit: Payer: Self-pay

## 2020-12-07 DIAGNOSIS — M5441 Lumbago with sciatica, right side: Secondary | ICD-10-CM | POA: Diagnosis present

## 2020-12-07 DIAGNOSIS — M6281 Muscle weakness (generalized): Secondary | ICD-10-CM

## 2020-12-07 DIAGNOSIS — R262 Difficulty in walking, not elsewhere classified: Secondary | ICD-10-CM

## 2020-12-07 DIAGNOSIS — G8929 Other chronic pain: Secondary | ICD-10-CM | POA: Diagnosis present

## 2020-12-07 NOTE — Therapy (Signed)
[facility] [place], [place], [address] Phone: [phone]   Fax:  [phone]  Physical Therapy Treatment  Patient Details  Name: Dominique Hinton MRN: [id number] Date of Birth: Aug 21, 1971 Referring Provider (PT): Drema Pry   Encounter Date: 12/07/2020   PT End of Session - 12/07/20 0951    Visit Number 8    Number of Visits 12    Date for PT Re-Evaluation 12/28/20    Authorization Type [place] Medicaid [employer]    Authorization Time Period 12 visits approved from 3/1-->12/28/20    Authorization - Visit Number 8    Authorization - Number of Visits 12    Progress Note Due on Visit 10    PT Start Time 0835    PT Stop Time 0922    PT Time Calculation (min) 47 min    Activity Tolerance Patient tolerated treatment well    Behavior During Therapy [facility] for tasks assessed/performed           Past Medical History:  Diagnosis Date  . Anxiety   . Arthritis   . Cervical pain   . Family history of adverse reaction to anesthesia    " My Mother has a hard time waking up "  . Hypertension     Past Surgical History:  Procedure Laterality Date  . ABDOMINAL HYSTERECTOMY    . APPENDECTOMY    . BREAST SURGERY    . CARDIAC CATHETERIZATION  09/21/2020  . CHOLECYSTECTOMY    . CORONARY BALLOON ANGIOPLASTY  09/21/2020  . CORONARY/GRAFT ACUTE MI REVASCULARIZATION N/A 09/21/2020   Procedure: Coronary/Graft Acute MI Revascularization;  Surgeon: Jettie Booze, MD;  Location: [place] [facility];  Service: Cardiovascular;  Laterality: N/A;  . LEFT HEART CATH AND CORONARY ANGIOGRAPHY N/A 09/21/2020   Procedure: LEFT HEART CATH AND CORONARY ANGIOGRAPHY;  Surgeon: Jettie Booze, MD;  Location: [place] [facility];  Service: Cardiovascular;  Laterality: N/A;  . TUBAL LIGATION      There were no vitals filed for this visit.   Subjective Assessment - 12/07/20 0846    Subjective Pt states she has been moving into her new  house and is more sore today.  STates she also had a family death and was up alot.    Currently in Pain? Yes    Pain Score 3     Pain Location Back    Pain Orientation Lower    Pain Descriptors / Indicators Aching    Pain Type Chronic pain                             [facility] Adult PT Treatment/Exercise - 12/07/20 0001      Lumbar Exercises: Stretches   Standing Extension 10 reps    Standing Extension Limitations 3D hip excursion      Lumbar Exercises: Standing   Functional Squats 10 reps      Modalities   Modalities Traction      Traction   Type of Traction Lumbar   in supine   Min (lbs) n/a    Max (lbs) 75   pt weighs 187# (max 94#)   Hold Time constant with 4 steps    Rest Time n/a    Time 8                  PT Education - 12/07/20 0959    Education Details educated on mechanical traction; need  to hydrate remainder of day.  Discussed pacing activities.    Person(s) Educated Patient    Methods Explanation    Comprehension Verbalized understanding            PT Short Term Goals - 11/25/20 0852      PT SHORT TERM GOAL #1   Title Patient will report centralization of RLE with symptoms not extending beyond right buttock    Baseline 11/25/20: 95% of the time to knee or less; was posterior/lateral right knee      PT SHORT TERM GOAL #2   Title Patient will be independent in self management strategies to improve quality of life and functional outcomes.    Baseline 11/25/20:  Reports compliance iwht HEP and self traction multiple times a day for pain relief;    Status On-going      PT SHORT TERM GOAL #3   Title Patient will report at least 50% improvement in overall symptoms and/or function to demonstrate improved functional mobility    Baseline 11/25/20:50% improvements, increased pain wiht moving homes.  Reports 50% improved    Status On-going      PT SHORT TERM GOAL #4   Title Patient will be independent with HEP in order to improve  functional outcomes.    Baseline 11/25/20: HEP daily    Status Achieved             PT Long Term Goals - 11/25/20 0856      PT LONG TERM GOAL #1   Title Patient will be able to walk at least 300 feet in 2 minutes to demonstrate improved ambulatory mobility    Baseline 441ft was 252ft    Status Achieved      PT LONG TERM GOAL #2   Title Patient will be able to stand for at least 15 minutes without severe pain to improve ability to cook and meal prep    Baseline 11/25/20: REports standing for 5 minutes at check-in today wiht increased pain; was:  Reports being able to stand 5-10 min this morning to brush teeth and do hair    Status On-going      PT LONG TERM GOAL #3   Title Patient will report at least 75% improvement in overall symptoms and/or function to demonstrate improved functional mobility    Baseline Reports 50% improvement    Status On-going                 Plan - 12/07/20 1000    Clinical Impression Statement Pt with increased pain today from stress from death in family and continued moving to new home.  completed several exercises and educated on trial today of mechanical traction.  Pt instructed to pace actvitiy as well to not "overdo" and stress herself.  Began traction at 75# in supine positioning.  Pt reported a little discomfort during traction so stopped at 5 minutes total pull time.  When patient returned to standing reported being painfree at his point and was very pleased with results.  Educated to hyrdrate well remainder of day and continue with HEP.    Personal Factors and Comorbidities Comorbidity 1;Comorbidity 2;Comorbidity 3+;Time since onset of injury/illness/exacerbation    Comorbidities shoulder pain, neck pain    Examination-Activity Limitations Transfers;Lift;Locomotion Level;Bend;Sit;Sleep;Squat;Stairs;Stand    Examination-Participation Restrictions Meal Prep;Laundry;Driving;Community Activity;Shop;Cleaning    Stability/Clinical Decision Making  Stable/Uncomplicated    Rehab Potential Good    PT Frequency --   1-2x/week for total of 12 visits over 8 week certification  PT Duration 8 weeks    PT Treatment/Interventions ADLs/Self Care Home Management;Aquatic Therapy;Electrical Stimulation;Iontophoresis 4mg /ml Dexamethasone;Moist Heat;Traction;DME Instruction;Neuromuscular re-education;Patient/family education;Manual techniques;Therapeutic exercise;Therapeutic activities;Functional mobility training;Stair training;Gait training;Dry needling;Joint Manipulations;Passive range of motion    PT Next Visit Plan F/u with traction and increase total pull as tolerated (up to 94#).  continue to progress core strength, Review proper body mechanics to reduce injury with lifting mechanics.    PT Home Exercise Plan prone, lumbar support  3/8: heelsqueeze, press ups, hip excursions, 3/17 bent knee fall in//outs, cat/cow, book stretch    Consulted and Agree with Plan of Care Patient           Patient will benefit from skilled therapeutic intervention in order to improve the following deficits and impairments:  Pain,Decreased mobility,Difficulty walking,Decreased range of motion,Decreased strength,Decreased activity tolerance,Decreased endurance,Postural dysfunction,Improper body mechanics  Visit Diagnosis: Muscle weakness (generalized)  Chronic midline low back pain with right-sided sciatica  Difficulty in walking, not elsewhere classified     Problem List Patient Active Problem List   Diagnosis Date Noted  . S/P PTCA (percutaneous transluminal coronary angioplasty)   . NSTEMI (non-ST elevated myocardial infarction) ([place]) 09/21/2020  . Chest pain 09/20/2020  . Obesity (BMI 30.0-34.9) 09/20/2020  . Essential hypertension 09/20/2020  . Tobacco abuse 09/20/2020  . Elevated troponin 09/20/2020  . Pain in both upper extremities 06/26/2019  . Weakness 06/26/2019   Teena Irani, PTA/CLT [phone]  Teena Irani 12/07/2020, 10:05  AM  [facility] [address] [place], [place], [address] Phone: [phone]   Fax:  [phone]  Name: Dominique Hinton MRN: [id number] Date of Birth: [birth year]

## 2020-12-09 ENCOUNTER — Other Ambulatory Visit: Payer: Self-pay

## 2020-12-09 ENCOUNTER — Ambulatory Visit (HOSPITAL_COMMUNITY): Payer: Medicaid Other | Admitting: Physical Therapy

## 2020-12-09 DIAGNOSIS — G8929 Other chronic pain: Secondary | ICD-10-CM

## 2020-12-09 DIAGNOSIS — M6281 Muscle weakness (generalized): Secondary | ICD-10-CM

## 2020-12-09 DIAGNOSIS — R262 Difficulty in walking, not elsewhere classified: Secondary | ICD-10-CM

## 2020-12-09 DIAGNOSIS — M5441 Lumbago with sciatica, right side: Secondary | ICD-10-CM

## 2020-12-09 NOTE — Therapy (Signed)
[facility] [place], [place], [address] Phone: [phone]   Fax:  [phone]  Physical Therapy Treatment  Patient Details  Name: Dominique Hinton MRN: [id number] Date of Birth: [birth year] Referring Provider (PT): Drema Pry   Encounter Date: 12/09/2020   PT End of Session - 12/09/20 1415    Visit Number 9    Number of Visits 12    Date for PT Re-Evaluation 12/28/20    Authorization Type [place] Medicaid [employer]    Authorization Time Period 12 visits approved from 3/1-->12/28/20    Authorization - Visit Number 9    Authorization - Number of Visits 12    Progress Note Due on Visit 10    PT Start Time 1320    PT Stop Time 1405    PT Time Calculation (min) 45 min    Activity Tolerance Patient tolerated treatment well    Behavior During Therapy [facility] for tasks assessed/performed           Past Medical History:  Diagnosis Date  . Anxiety   . Arthritis   . Cervical pain   . Family history of adverse reaction to anesthesia    " My Mother has a hard time waking up "  . Hypertension     Past Surgical History:  Procedure Laterality Date  . ABDOMINAL HYSTERECTOMY    . APPENDECTOMY    . BREAST SURGERY    . CARDIAC CATHETERIZATION  09/21/2020  . CHOLECYSTECTOMY    . CORONARY BALLOON ANGIOPLASTY  09/21/2020  . CORONARY/GRAFT ACUTE MI REVASCULARIZATION N/A 09/21/2020   Procedure: Coronary/Graft Acute MI Revascularization;  Surgeon: Jettie Booze, MD;  Location: [place] [facility];  Service: Cardiovascular;  Laterality: N/A;  . LEFT HEART CATH AND CORONARY ANGIOGRAPHY N/A 09/21/2020   Procedure: LEFT HEART CATH AND CORONARY ANGIOGRAPHY;  Surgeon: Jettie Booze, MD;  Location: [place] [facility];  Service: Cardiovascular;  Laterality: N/A;  . TUBAL LIGATION      There were no vitals filed for this visit.   Subjective Assessment - 12/09/20 1325    Subjective pt states she felt great up until 8 hours  after her last treatment then came back.  STates the traction really helped and would like to try it again today on her stomach.  Currently 8/10 in pain today and increased due to prolonged standing at a funeral service.    Limitations House hold activities    Currently in Pain? Yes    Pain Score 8     Pain Location Back    Pain Orientation Lower    Pain Descriptors / Indicators Aching                             [facility] Adult PT Treatment/Exercise - 12/09/20 0001      Lumbar Exercises: Stretches   Standing Extension 10 reps    Standing Extension Limitations 3D hip excursion      Lumbar Exercises: Standing   Heel Raises 20 reps    Forward Lunge 10 reps    Forward Lunge Limitations onto 4" step without UE    Other Standing Lumbar Exercises hip abduction and extension 10X each      Modalities   Modalities Traction      Traction   Type of Traction Lumbar    Min (lbs) n/a    Max (lbs) 85    Hold Time constant with 4  steps    Rest Time n/a    Time 10                    PT Short Term Goals - 11/25/20 1740      PT SHORT TERM GOAL #1   Title Patient will report centralization of RLE with symptoms not extending beyond right buttock    Baseline 11/25/20: 95% of the time to knee or less; was posterior/lateral right knee      PT SHORT TERM GOAL #2   Title Patient will be independent in self management strategies to improve quality of life and functional outcomes.    Baseline 11/25/20:  Reports compliance iwht HEP and self traction multiple times a day for pain relief;    Status On-going      PT SHORT TERM GOAL #3   Title Patient will report at least 50% improvement in overall symptoms and/or function to demonstrate improved functional mobility    Baseline 11/25/20:50% improvements, increased pain wiht moving homes.  Reports 50% improved    Status On-going      PT SHORT TERM GOAL #4   Title Patient will be independent with HEP in order to improve  functional outcomes.    Baseline 11/25/20: HEP daily    Status Achieved             PT Long Term Goals - 11/25/20 0856      PT LONG TERM GOAL #1   Title Patient will be able to walk at least 300 feet in 2 minutes to demonstrate improved ambulatory mobility    Baseline 451ft was 288ft    Status Achieved      PT LONG TERM GOAL #2   Title Patient will be able to stand for at least 15 minutes without severe pain to improve ability to cook and meal prep    Baseline 11/25/20: REports standing for 5 minutes at check-in today wiht increased pain; was:  Reports being able to stand 5-10 min this morning to brush teeth and do hair    Status On-going      PT LONG TERM GOAL #3   Title Patient will report at least 75% improvement in overall symptoms and/or function to demonstrate improved functional mobility    Baseline Reports 50% improvement    Status On-going                 Plan - 12/09/20 1416    Clinical Impression Statement Pt with increased pain today from funeral and inially bent over after standing and walking back into clinic.  Pleased with results of traction last visit. Added standing LE strengthening and stability exercises with cues for form and alignment.  Pt also tends to speed through exercises so cues to complete more slowly. Pt able to complete all exericses without complaints of pain or issues.   Continued with mechanical traction with trial in prone this session and increased pull by 10# to 85# total.  Pt reported reduction of pain to 1/10 and noted improvement in ambulation at end of session.    Personal Factors and Comorbidities Comorbidity 1;Comorbidity 2;Comorbidity 3+;Time since onset of injury/illness/exacerbation    Comorbidities shoulder pain, neck pain    Examination-Activity Limitations Transfers;Lift;Locomotion Level;Bend;Sit;Sleep;Squat;Stairs;Stand    Examination-Participation Restrictions Meal Prep;Laundry;Driving;Community Activity;Shop;Cleaning     Stability/Clinical Decision Making Stable/Uncomplicated    Rehab Potential Good    PT Frequency --   1-2x/week for total of 12 visits over 8 week certification  PT Duration 8 weeks    PT Treatment/Interventions ADLs/Self Care Home Management;Aquatic Therapy;Electrical Stimulation;Iontophoresis 4mg /ml Dexamethasone;Moist Heat;Traction;DME Instruction;Neuromuscular re-education;Patient/family education;Manual techniques;Therapeutic exercise;Therapeutic activities;Functional mobility training;Stair training;Gait training;Dry needling;Joint Manipulations;Passive range of motion    PT Next Visit Plan F/u with traction and increase total pull as tolerated (up to 94#).  continue to progress core strength, Review proper body mechanics to reduce injury with lifting mechanics.    PT Home Exercise Plan prone, lumbar support  3/8: heelsqueeze, press ups, hip excursions, 3/17 bent knee fall in//outs, cat/cow, book stretch    Consulted and Agree with Plan of Care Patient           Patient will benefit from skilled therapeutic intervention in order to improve the following deficits and impairments:  Pain,Decreased mobility,Difficulty walking,Decreased range of motion,Decreased strength,Decreased activity tolerance,Decreased endurance,Postural dysfunction,Improper body mechanics  Visit Diagnosis: Difficulty in walking, not elsewhere classified  Chronic midline low back pain with right-sided sciatica  Muscle weakness (generalized)     Problem List Patient Active Problem List   Diagnosis Date Noted  . S/P PTCA (percutaneous transluminal coronary angioplasty)   . NSTEMI (non-ST elevated myocardial infarction) ([place]) 09/21/2020  . Chest pain 09/20/2020  . Obesity (BMI 30.0-34.9) 09/20/2020  . Essential hypertension 09/20/2020  . Tobacco abuse 09/20/2020  . Elevated troponin 09/20/2020  . Pain in both upper extremities 06/26/2019  . Weakness 06/26/2019   Teena Irani,  PTA/CLT [phone]  Teena Irani 12/09/2020, 2:16 PM  [facility] [address] [place], [place], [address] Phone: [phone]   Fax:  [phone]  Name: Dominique Hinton MRN: [id number] Date of Birth: 1971/02/07

## 2020-12-14 ENCOUNTER — Ambulatory Visit (HOSPITAL_COMMUNITY): Payer: Medicaid Other | Admitting: Physical Therapy

## 2020-12-14 ENCOUNTER — Other Ambulatory Visit: Payer: Self-pay

## 2020-12-14 DIAGNOSIS — M6281 Muscle weakness (generalized): Secondary | ICD-10-CM

## 2020-12-14 DIAGNOSIS — G8929 Other chronic pain: Secondary | ICD-10-CM

## 2020-12-14 DIAGNOSIS — M5441 Lumbago with sciatica, right side: Secondary | ICD-10-CM

## 2020-12-14 DIAGNOSIS — R262 Difficulty in walking, not elsewhere classified: Secondary | ICD-10-CM

## 2020-12-14 NOTE — Therapy (Signed)
[facility] [address] [place], [place], [address] Phone: [phone]   Fax:  [phone]  Physical Therapy Treatment Progress Note Reporting Period 11/02/2020  to 12/14/2020  See note below for Objective Data and Assessment of Progress/Goals.      Patient Details  Name: Dominique Hinton MRN: [id number] Date of Birth: December 13, 1970 Referring Provider (PT): Drema Pry   Encounter Date: 12/14/2020   PT End of Session - 12/14/20 0934    Visit Number 10    Number of Visits 12    Date for PT Re-Evaluation 12/28/20    Authorization Type [place] Medicaid [employer]    Authorization Time Period 12 visits approved from 3/1-->12/28/20    Authorization - Visit Number 10    Authorization - Number of Visits 12    Progress Note Due on Visit 20    PT Start Time 0833    PT Stop Time 0933    PT Time Calculation (min) 60 min    Activity Tolerance Patient tolerated treatment well    Behavior During Therapy [facility] for tasks assessed/performed           Past Medical History:  Diagnosis Date  . Anxiety   . Arthritis   . Cervical pain   . Family history of adverse reaction to anesthesia    " My Mother has a hard time waking up "  . Hypertension     Past Surgical History:  Procedure Laterality Date  . ABDOMINAL HYSTERECTOMY    . APPENDECTOMY    . BREAST SURGERY    . CARDIAC CATHETERIZATION  09/21/2020  . CHOLECYSTECTOMY    . CORONARY BALLOON ANGIOPLASTY  09/21/2020  . CORONARY/GRAFT ACUTE MI REVASCULARIZATION N/A 09/21/2020   Procedure: Coronary/Graft Acute MI Revascularization;  Surgeon: Jettie Booze, MD;  Location: [facility];  Service: Cardiovascular;  Laterality: N/A;  . LEFT HEART CATH AND CORONARY ANGIOGRAPHY N/A 09/21/2020   Procedure: LEFT HEART CATH AND CORONARY ANGIOGRAPHY;  Surgeon: Jettie Booze, MD;  Location: [facility];  Service: Cardiovascular;  Laterality: N/A;  . TUBAL LIGATION      There  were no vitals filed for this visit.   Subjective Assessment - 12/14/20 0943    Subjective Pt states doing traction in prone did not improve her pain and actually made it worse.  States she has improved since beginning therapy with radiating pain past her hip only 25 % of the time now.    Currently in Pain? Yes    Pain Score 7     Pain Location Back    Pain Orientation Lower    Pain Descriptors / Indicators Aching;Shooting    Pain Type Chronic pain              [facility] PT Assessment - 12/14/20 0839      Assessment   Medical Diagnosis LBP    Referring Provider (PT) Drema Pry    Onset Date/Surgical Date --   approx 5 years     Prior Function   Level of Independence Independent      Cognition   Overall Cognitive Status Within Functional Limits for tasks assessed      AROM   Lumbar Flexion 50% limited   was 50% limited   Lumbar Extension 10% limited   was 25% limited   Lumbar - Right Side Bend WNL   was WNL   Lumbar - Left Side Bend 25% limited   was 25% limited  Lumbar - Right Rotation WNL   was 25%   Lumbar - Left Rotation WNL   was 25% limited     Strength   Lumbar Flexion 3+/5   was 3-/5   Lumbar Extension 4/5   was 3-/5     Special Tests   Lumbar Tests Slump Test      Slump test   Findings Positive    Side Right   was RT side     Bed Mobility   Bed Mobility Sit to Supine;Supine to Sit    Supine to Sit Independent    Sit to Supine Independent      Transfers   Transfers Sit to Stand;Stand to Sit    Sit to Stand 7: Independent      Ambulation/Gait   Ambulation/Gait Yes    Ambulation/Gait Assistance 7: Independent    Ambulation Distance (Feet) 450 Feet   was 225 feet   Gait Comments                         [facility] Adult PT Treatment/Exercise - 12/14/20 0839      Modalities   Modalities Traction      Traction   Type of Traction Lumbar    Min (lbs) n/a    Max (lbs) 85    Hold Time constant with 4 steps    Rest Time n/a     Time 10                    PT Short Term Goals - 12/14/20 0854      PT SHORT TERM GOAL #1   Title Patient will report centralization of RLE with symptoms not extending beyond right buttock    Baseline 11/25/20: 95% of the time to knee or less; was posterior/lateral right knee  4/12: 25% of time to knee, 75% into Rt hip/buttock    Status Partially Met      PT SHORT TERM GOAL #2   Title Patient will be independent in self management strategies to improve quality of life and functional outcomes.    Baseline 11/25/20:  Reports compliance iwht HEP and self traction multiple times a day for pain relief;    Status Achieved      PT SHORT TERM GOAL #3   Title Patient will report at least 50% improvement in overall symptoms and/or function to demonstrate improved functional mobility    Baseline 11/25/20:50% improvements, increased pain wiht moving homes.  Reports 50% improved; 4/12:  still reports 50% improved but no more    Status Achieved      PT SHORT TERM GOAL #4   Title Patient will be independent with HEP in order to improve functional outcomes.    Baseline 11/25/20: HEP daily    Status Achieved             PT Long Term Goals - 12/14/20 0857      PT LONG TERM GOAL #1   Title Patient will be able to walk at least 300 feet in 2 minutes to demonstrate improved ambulatory mobility    Baseline 459ft was 281ft    Status Achieved      PT LONG TERM GOAL #2   Title Patient will be able to stand for at least 15 minutes without severe pain to improve ability to cook and meal prep    Baseline 11/25/20: REports standing for 5 minutes at check-in today wiht increased pain; was:  Reports being able to stand 5-10 min this morning to brush teeth and do hair    Status On-going      PT LONG TERM GOAL #3   Title Patient will report at least 75% improvement in overall symptoms and/or function to demonstrate improved functional mobility    Baseline Reports 50% improvement    Status On-going                  Plan - 12/14/20 0945    Clinical Impression Statement Tenth visit progress note completed this session.  Pt reports 50% improvement since beginning therapy with most improvement completed once beginning mechanical traction.  Pt has met 3/4 STG's with centralization of pain not achieved.  Pt has also met 1/3 LTG's at this point.  Lumbar rotation is now WNL compared to being limited by 25% in each direction at initial evaluation and also lumbar extension has improved 15%.  Pt would like to continue with the next 2 visits to utilize lumbar traction and work on Economist and advanced stabilization to see if this is further beneficial.    Personal Factors and Comorbidities Comorbidity 1;Comorbidity 2;Comorbidity 3+;Time since onset of injury/illness/exacerbation    Comorbidities shoulder pain, neck pain    Examination-Activity Limitations Transfers;Lift;Locomotion Level;Bend;Sit;Sleep;Squat;Stairs;Stand    Examination-Participation Restrictions Meal Prep;Laundry;Driving;Community Activity;Shop;Cleaning    Stability/Clinical Decision Making Stable/Uncomplicated    Rehab Potential Good    PT Frequency --   1-2x/week for total of 12 visits over 8 week certification   PT Duration 8 weeks    PT Treatment/Interventions ADLs/Self Care Home Management;Aquatic Therapy;Electrical Stimulation;Iontophoresis 4mg /ml Dexamethasone;Moist Heat;Traction;DME Instruction;Neuromuscular re-education;Patient/family education;Manual techniques;Therapeutic exercise;Therapeutic activities;Functional mobility training;Stair training;Gait training;Dry needling;Joint Manipulations;Passive range of motion    PT Next Visit Plan Traction can be increased to total pull up to 94#.  continue to progress core strength, Review proper body mechanics to reduce injury with lifting mechanics (has 20# grandson).  complete re-eval in 2 more visits.    PT Home Exercise Plan prone, lumbar support  3/8: heelsqueeze, press  ups, hip excursions, 3/17 bent knee fall in//outs, cat/cow, book stretch    Consulted and Agree with Plan of Care Patient           Patient will benefit from skilled therapeutic intervention in order to improve the following deficits and impairments:  Pain,Decreased mobility,Difficulty walking,Decreased range of motion,Decreased strength,Decreased activity tolerance,Decreased endurance,Postural dysfunction,Improper body mechanics  Visit Diagnosis: Difficulty in walking, not elsewhere classified  Chronic midline low back pain with right-sided sciatica  Muscle weakness (generalized)     Problem List Patient Active Problem List   Diagnosis Date Noted  . S/P PTCA (percutaneous transluminal coronary angioplasty)   . NSTEMI (non-ST elevated myocardial infarction) ([place]) 09/21/2020  . Chest pain 09/20/2020  . Obesity (BMI 30.0-34.9) 09/20/2020  . Essential hypertension 09/20/2020  . Tobacco abuse 09/20/2020  . Elevated troponin 09/20/2020  . Pain in both upper extremities 06/26/2019  . Weakness 06/26/2019   Teena Irani, PTA/CLT [phone]  Teena Irani 12/14/2020, 9:47 AM  [facility] [address] [place], [place], [address] Phone: [phone]   Fax:  [phone]  Name: [name] MRN: [id number] Date of Birth: [birth year]

## 2020-12-16 ENCOUNTER — Ambulatory Visit (HOSPITAL_COMMUNITY): Payer: Medicaid Other | Admitting: Physical Therapy

## 2020-12-16 ENCOUNTER — Other Ambulatory Visit: Payer: Self-pay

## 2020-12-16 DIAGNOSIS — R262 Difficulty in walking, not elsewhere classified: Secondary | ICD-10-CM

## 2020-12-16 DIAGNOSIS — G8929 Other chronic pain: Secondary | ICD-10-CM

## 2020-12-16 DIAGNOSIS — M6281 Muscle weakness (generalized): Secondary | ICD-10-CM

## 2020-12-16 DIAGNOSIS — M5441 Lumbago with sciatica, right side: Secondary | ICD-10-CM

## 2020-12-16 NOTE — Therapy (Addendum)
[facility] [place], [place], [address] Phone: [phone]   Fax:  [phone]  Physical Therapy Treatment Discharge  Patient Details  Name: Dominique Hinton MRN: [id number] Date of Birth: Feb 27, 1971 Referring Provider (PT): Drema Pry  PHYSICAL THERAPY DISCHARGE SUMMARY  Visits from Start of Care: 11  Current functional level related to goals / functional outcomes: Pt reports centralization of LE pain and demo improvement with majority of STG/LTG   Remaining deficits: Continues to experience LBP and instances of radicular LE pain   Education / Equipment: HEP  Plan: Patient agrees to discharge.  Patient goals were partially met. Patient is being discharged due to the patient's request.  ?????      Encounter Date: 12/16/2020   PT End of Session - 12/16/20 0914    Visit Number 11    Number of Visits 12    Date for PT Re-Evaluation 12/28/20    Authorization Type [place] Medicaid [employer]    Authorization Time Period 12 visits approved from 3/1-->12/28/20    Authorization - Visit Number 11    Authorization - Number of Visits 12    Progress Note Due on Visit 20    PT Start Time 0833    PT Stop Time 0925    PT Time Calculation (min) 52 min    Activity Tolerance Patient tolerated treatment well    Behavior During Therapy [facility] for tasks assessed/performed           Past Medical History:  Diagnosis Date  . Anxiety   . Arthritis   . Cervical pain   . Family history of adverse reaction to anesthesia    " My Mother has a hard time waking up "  . Hypertension     Past Surgical History:  Procedure Laterality Date  . ABDOMINAL HYSTERECTOMY    . APPENDECTOMY    . BREAST SURGERY    . CARDIAC CATHETERIZATION  09/21/2020  . CHOLECYSTECTOMY    . CORONARY BALLOON ANGIOPLASTY  09/21/2020  . CORONARY/GRAFT ACUTE MI REVASCULARIZATION N/A 09/21/2020   Procedure: Coronary/Graft Acute MI Revascularization;  Surgeon:  Jettie Booze, MD;  Location: [facility];  Service: Cardiovascular;  Laterality: N/A;  . LEFT HEART CATH AND CORONARY ANGIOGRAPHY N/A 09/21/2020   Procedure: LEFT HEART CATH AND CORONARY ANGIOGRAPHY;  Surgeon: Jettie Booze, MD;  Location: [facility];  Service: Cardiovascular;  Laterality: N/A;  . TUBAL LIGATION      There were no vitals filed for this visit.  The below measurements taken on 12/14/2020:     [facility] PT Assessment - 12/16/20 0913      Assessment   Medical Diagnosis LBP    Referring Provider (PT) Drema Pry    Onset Date/Surgical Date --   approx 5 years     Prior Function   Level of Independence Independent      Cognition   Overall Cognitive Status Within Functional Limits for tasks assessed      AROM   Lumbar Flexion 50% limited   was 50% limited   Lumbar Extension 10% limited   was 25% limited   Lumbar - Right Side Bend WNL   was WNL   Lumbar - Left Side Bend 25% limited   was 25% limited   Lumbar - Right Rotation WNL   was 25%   Lumbar - Left Rotation WNL   was 25% limited     Strength   Lumbar Flexion 3+/5  was 3-/5   Lumbar Extension 4/5   was 3-/5     Special Tests   Lumbar Tests Slump Test      Slump test   Findings Positive    Side Right   was RT side     Bed Mobility   Bed Mobility Sit to Supine;Supine to Sit    Supine to Sit Independent    Sit to Supine Independent      Transfers   Transfers Sit to Stand;Stand to Sit    Sit to Stand 7: Independent      Ambulation/Gait   Ambulation/Gait Yes    Ambulation/Gait Assistance 7: Independent    Ambulation Distance (Feet) 450 Feet   was 225 feet   Gait Comments 2MWT                         [facility] Adult PT Treatment/Exercise - 12/16/20 0913      Lumbar Exercises: Standing   Other Standing Lumbar Exercises body mechanics:  Lifting, mopping, bending, etc      Lumbar Exercises: Supine   Large Ball Abdominal Isometric 10 reps;5 seconds    Large  Ball Oblique Isometric 10 reps    Large Ball Oblique Isometric Limitations each direction      Modalities   Modalities Traction      Traction   Type of Traction Lumbar    Min (lbs) n/a    Max (lbs) 94    Hold Time constant with 4 steps    Rest Time n/a    Time 10                  PT Education - 12/16/20 0914    Education Details body mechanics education, importance of maintaining complaince with HEP, ball core stab exercises    Person(s) Educated Patient    Methods Explanation;Demonstration;Tactile cues;Verbal cues    Comprehension Verbalized understanding;Returned demonstration;Verbal cues required;Tactile cues required            PT Short Term Goals - 12/14/20 0854      PT SHORT TERM GOAL #1   Title Patient will report centralization of RLE with symptoms not extending beyond right buttock    Baseline 11/25/20: 95% of the time to knee or less; was posterior/lateral right knee  4/12: 25% of time to knee, 75% into Rt hip/buttock    Status Partially Met      PT SHORT TERM GOAL #2   Title Patient will be independent in self management strategies to improve quality of life and functional outcomes.    Baseline 11/25/20:  Reports compliance iwht HEP and self traction multiple times a day for pain relief;    Status Achieved      PT SHORT TERM GOAL #3   Title Patient will report at least 50% improvement in overall symptoms and/or function to demonstrate improved functional mobility    Baseline 11/25/20:50% improvements, increased pain wiht moving homes.  Reports 50% improved; 4/12:  still reports 50% improved but no more    Status Achieved      PT SHORT TERM GOAL #4   Title Patient will be independent with HEP in order to improve functional outcomes.    Baseline 11/25/20: HEP daily    Status Achieved             PT Long Term Goals - 12/14/20 0857      PT LONG TERM GOAL #1   Title Patient will be  able to walk at least 300 feet in 2 minutes to demonstrate improved  ambulatory mobility    Baseline 487ft was 242ft    Status Achieved      PT LONG TERM GOAL #2   Title Patient will be able to stand for at least 15 minutes without severe pain to improve ability to cook and meal prep    Baseline 11/25/20: REports standing for 5 minutes at check-in today wiht increased pain; was:  Reports being able to stand 5-10 min this morning to brush teeth and do hair    Status On-going      PT LONG TERM GOAL #3   Title Patient will report at least 75% improvement in overall symptoms and/or function to demonstrate improved functional mobility    Baseline Reports 50% improvement    Status On-going                 Plan - 12/16/20 6295    Clinical Impression Statement Above objective measurements taken at last session progress notes.  Pt reports she is compliant with HEP and overall improved.  States she would like for today to be her last day.  Reviewed/instructed in body mechanics including lifting, mopping, bending and twisting.  Pt able to demonstrate all correctly with minimal cues needed.  Instructed with ball core stab exercises as she has one at home she would like to use.  Cues needed for proper breathing during activity. Increased traction to max weight of 94# with patient reporting good results but not as great as last visit being painfree.  Pt with no further questions and still requests to be discharged at end of session.    Personal Factors and Comorbidities Comorbidity 1;Comorbidity 2;Comorbidity 3+;Time since onset of injury/illness/exacerbation    Comorbidities shoulder pain, neck pain    Examination-Activity Limitations Transfers;Lift;Locomotion Level;Bend;Sit;Sleep;Squat;Stairs;Stand    Examination-Participation Restrictions Meal Prep;Laundry;Driving;Community Activity;Shop;Cleaning    Stability/Clinical Decision Making Stable/Uncomplicated    Rehab Potential Good    PT Frequency --   1-2x/week for total of 12 visits over 8 week certification   PT  Duration 8 weeks    PT Treatment/Interventions ADLs/Self Care Home Management;Aquatic Therapy;Electrical Stimulation;Iontophoresis 4mg /ml Dexamethasone;Moist Heat;Traction;DME Instruction;Neuromuscular re-education;Patient/family education;Manual techniques;Therapeutic exercise;Therapeutic activities;Functional mobility training;Stair training;Gait training;Dry needling;Joint Manipulations;Passive range of motion    PT Next Visit Plan Discharge to HEP per pateint request.    PT Home Exercise Plan prone, lumbar support  3/8: heelsqueeze, press ups, hip excursions, 3/17 bent knee fall in//outs, cat/cow, book stretch 4/14:  ball stab exercises and body mechanics    Consulted and Agree with Plan of Care Patient           Patient will benefit from skilled therapeutic intervention in order to improve the following deficits and impairments:  Pain,Decreased mobility,Difficulty walking,Decreased range of motion,Decreased strength,Decreased activity tolerance,Decreased endurance,Postural dysfunction,Improper body mechanics  Visit Diagnosis: Difficulty in walking, not elsewhere classified  Chronic midline low back pain with right-sided sciatica  Muscle weakness (generalized)     Problem List Patient Active Problem List   Diagnosis Date Noted  . S/P PTCA (percutaneous transluminal coronary angioplasty)   . NSTEMI (non-ST elevated myocardial infarction) ([place]) 09/21/2020  . Chest pain 09/20/2020  . Obesity (BMI 30.0-34.9) 09/20/2020  . Essential hypertension 09/20/2020  . Tobacco abuse 09/20/2020  . Elevated troponin 09/20/2020  . Pain in both upper extremities 06/26/2019  . Weakness 06/26/2019   Teena Irani, PTA/CLT [phone]  Roseanne Reno B 12/16/2020, 9:42 AM   12:36 PM, 12/17/20  M. Sherlyn Lees, PT, DPT Physical Therapist- [place] Office Number: [phone]   [facility] [address] [place], [place], [address] Phone:  [phone]   Fax:  [phone]  Name: Dominique Hinton MRN: [id number] Date of Birth: 12-20-1970

## 2020-12-21 ENCOUNTER — Ambulatory Visit (HOSPITAL_COMMUNITY): Payer: Medicaid Other | Admitting: Physical Therapy

## 2021-01-06 ENCOUNTER — Ambulatory Visit
Admission: EM | Admit: 2021-01-06 | Discharge: 2021-01-06 | Disposition: A | Discharge status: Home / Self Care | Payer: Medicaid Other | Attending: Emergency Medicine | Admitting: Emergency Medicine

## 2021-01-06 ENCOUNTER — Other Ambulatory Visit: Payer: Self-pay

## 2021-01-06 DIAGNOSIS — K05219 Aggressive periodontitis, localized, unspecified severity: Secondary | ICD-10-CM

## 2021-01-06 DIAGNOSIS — K0889 Other specified disorders of teeth and supporting structures: Secondary | ICD-10-CM | POA: Diagnosis not present

## 2021-01-06 MED ORDER — AMOXICILLIN-POT CLAVULANATE 875-125 MG PO TABS: 1.0000 | ORAL_TABLET | Freq: Two times a day (BID) | ORAL | 0 refills | Status: AC

## 2021-01-06 MED ORDER — CHLORHEXIDINE GLUCONATE 0.12 % MT SOLN: 15.0000 mL | Freq: Two times a day (BID) | OROMUCOSAL | 0 refills | Status: DC

## 2021-01-06 MED ORDER — IBUPROFEN 800 MG PO TABS: 800.0000 mg | ORAL_TABLET | Freq: Three times a day (TID) | ORAL | 0 refills | Status: DC

## 2021-01-06 MED ORDER — KETOROLAC TROMETHAMINE 60 MG/2ML IM SOLN
60.0000 mg | Freq: Once | INTRAMUSCULAR | Status: AC
  Administered 2021-01-06: 60 mg via INTRAMUSCULAR

## 2021-01-06 NOTE — Discharge Instructions (Addendum)
Ibuprofen and peridex prescribed for pain Augmentin for possible infection Recommend soft diet until evaluated by dentist Maintain oral hygiene care Follow up with dentist as soon as possible for further evaluation and treatment  Return or go to the ED if you have any new or worsening symptoms such as fever, chills, difficulty swallowing, painful swallowing, oral or neck swelling, nausea, vomiting, chest pain, SOB, etc..Marland Kitchen

## 2021-01-06 NOTE — ED Provider Notes (Signed)
[place]   [id number] 01/06/21 Arrival Time: 6967  CC: DENTAL PAIN  SUBJECTIVE:  Dominique Hinton is a 50 y.o. female who presents with dental pain and gum swelling x 1 week.  Denies a precipitating event or trauma.  Localizes pain to LT lower gum.  Has tried OTC analgesics without relief.  Worse with chewing.  Does not see a dentist regularly.  Denies similar symptoms in the past.  Denies fever, chills, dysphagia, odynophagia, oral or neck swelling, nausea, vomiting, chest pain, SOB.    ROS: As per HPI.  All other pertinent ROS negative.     Past Medical History:  Diagnosis Date  . Anxiety   . Arthritis   . Cervical pain   . Family history of adverse reaction to anesthesia    " My Mother has a hard time waking up "  . Hypertension    Past Surgical History:  Procedure Laterality Date  . ABDOMINAL HYSTERECTOMY    . APPENDECTOMY    . BREAST SURGERY    . CARDIAC CATHETERIZATION  09/21/2020  . CHOLECYSTECTOMY    . CORONARY BALLOON ANGIOPLASTY  09/21/2020  . CORONARY/GRAFT ACUTE MI REVASCULARIZATION N/A 09/21/2020   Procedure: Coronary/Graft Acute MI Revascularization;  Surgeon: Jettie Booze, MD;  Location: [place] [facility];  Service: Cardiovascular;  Laterality: N/A;  . LEFT HEART CATH AND CORONARY ANGIOGRAPHY N/A 09/21/2020   Procedure: LEFT HEART CATH AND CORONARY ANGIOGRAPHY;  Surgeon: Jettie Booze, MD;  Location: [facility];  Service: Cardiovascular;  Laterality: N/A;  . TUBAL LIGATION     Allergies  Allergen Reactions  . Cortisone Shortness Of Breath and Other (See Comments)    Made me feel loopy  . Doxycycline Rash   No current facility-administered medications on file prior to encounter.   Current Outpatient Medications on File Prior to Encounter  Medication Sig Dispense Refill  . aspirin 81 MG chewable tablet Chew 1 tablet (81 mg total) by mouth daily. 90 tablet 1  . aspirin 81 MG chewable tablet CHEW 1 TABLET (81 MG TOTAL) BY  MOUTH DAILY. 90 tablet 1  . citalopram (CELEXA) 20 MG tablet Take 20 mg by mouth daily.  3  . DULoxetine (CYMBALTA) 60 MG capsule Take 1 capsule (60 mg total) by mouth daily. 30 capsule 12  . gabapentin (NEURONTIN) 300 MG capsule Take 300 mg by mouth 2 (two) times daily.    . hydrochlorothiazide (HYDRODIURIL) 25 MG tablet Take 0.5 tablets (12.5 mg total) by mouth daily. 45 tablet 3  . HYDROcodone-acetaminophen (NORCO) 10-325 MG tablet Take 1 tablet by mouth 4 (four) times daily as needed for pain. Only at night prn    . losartan (COZAAR) 100 MG tablet Take 100 mg by mouth daily.    . meloxicam (MOBIC) 15 MG tablet Take 15 mg by mouth. Monday Wednesday and Friday- whole tablet Sat/Sun -Half tablet    . nicotine (NICODERM CQ - DOSED IN MG/24 HOURS) 14 mg/24hr patch Place 1 patch (14 mg total) onto the skin daily. 28 patch 0  . nicotine (NICODERM CQ - DOSED IN MG/24 HR) 7 mg/24hr patch Place 1 patch (7 mg total) onto the skin daily. 14 patch 0  . nitroGLYCERIN (NITROSTAT) 0.4 MG SL tablet Place 1 tablet (0.4 mg total) under the tongue every 5 (five) minutes as needed for chest pain. 25 tablet 2  . nitroGLYCERIN (NITROSTAT) 0.4 MG SL tablet PLACE 1 TABLET (0.4 MG TOTAL) UNDER THE TONGUE EVERY FIVE MINUTES  AS NEEDED FOR CHEST PAIN. 25 tablet 2  . pantoprazole (PROTONIX) 40 MG tablet Take 1 tablet (40 mg total) by mouth daily. 30 tablet 1  . pantoprazole (PROTONIX) 40 MG tablet TAKE 1 TABLET (40 MG TOTAL) BY MOUTH DAILY. 30 tablet 1  . rosuvastatin (CRESTOR) 20 MG tablet Take 1 tablet (20 mg total) by mouth daily at 6 PM. 90 tablet 1  . ticagrelor (BRILINTA) 90 MG TABS tablet Take 1 tablet (90 mg total) by mouth 2 (two) times daily. 180 tablet 2  . tiZANidine (ZANAFLEX) 4 MG tablet Take 4-8 mg by mouth 3 (three) times daily as needed for muscle spasms.    . valACYclovir (VALTREX) 1000 MG tablet Take 1,000 mg by mouth daily.     Social History   Socioeconomic History  . Marital status: Legally  Separated    Spouse name: Not on file  . Number of children: 3  . Years of education: 11  . Highest education level: Not on file  Occupational History  . Occupation: waitress/cook  Tobacco Use  . Smoking status: Current Every Day Smoker    Packs/day: 1.00    Types: Cigarettes  . Smokeless tobacco: Never Used  Vaping Use  . Vaping Use: Never used  Substance and Sexual Activity  . Alcohol use: Yes    Comment: occasionally  . Drug use: Yes    Types: Marijuana    Comment: occasionally  . Sexual activity: Yes    Birth control/protection: None  Other Topics Concern  . Not on file  Social History Narrative   Lives at home with daughter, Joslyn Hy and fiance.   10-12 cups caffeine per day.   Right-handed.   Social Determinants of Health   Financial Resource Strain: Not on file  Food Insecurity: Not on file  Transportation Needs: Not on file  Physical Activity: Not on file  Stress: Not on file  Social Connections: Not on file  Intimate Partner Violence: Not on file   Family History  Problem Relation Age of Onset  . Other Mother        "borderline diabetic"  . Alzheimer's disease Father   . Hypertension Other     OBJECTIVE:  Vitals:   01/06/21 1130  BP: (!) 125/91  Pulse: 79  Resp: 20  Temp: 98.6 F (37 C)  SpO2: 96%    General appearance: alert; no distress HENT: normocephalic; atraumatic; dentition: fair; dental caries over left lower gums with areas of fluctuance, abscess over LT lower gum Neck: supple without LAD Lungs: normal respirations Skin: warm and dry Psychological: alert and cooperative; normal mood and affect  ASSESSMENT & PLAN:  1. Pain, dental   2. Gum abscess     Meds ordered this encounter  Medications  . amoxicillin-clavulanate (AUGMENTIN) 875-125 MG tablet    Sig: Take 1 tablet by mouth every 12 (twelve) hours for 10 days.    Dispense:  20 tablet    Refill:  0    Order Specific Question:   Supervising Provider    Answer:   Raylene Everts [[id number]]  . ibuprofen (ADVIL) 800 MG tablet    Sig: Take 1 tablet (800 mg total) by mouth 3 (three) times daily.    Dispense:  21 tablet    Refill:  0    Order Specific Question:   Supervising Provider    Answer:   Raylene Everts [[id number]]  . chlorhexidine (PERIDEX) 0.12 % solution    Sig: Use as directed 15  mLs in the mouth or throat 2 (two) times daily.    Dispense:  473 mL    Refill:  0    Order Specific Question:   Supervising Provider    Answer:   Raylene Everts [[id number]]  . ketorolac (TORADOL) injection 60 mg   Ibuprofen and peridex prescribed for pain Augmentin for possible infection Recommend soft diet until evaluated by dentist Maintain oral hygiene care Follow up with dentist as soon as possible for further evaluation and treatment  Return or go to the ED if you have any new or worsening symptoms such as fever, chills, difficulty swallowing, painful swallowing, oral or neck swelling, nausea, vomiting, chest pain, SOB, etc...   Reviewed expectations re: course of current medical issues. Questions answered. Outlined signs and symptoms indicating need for more acute intervention. Patient verbalized understanding. After Visit Summary given.   Lestine Box, PA-C 01/06/21 1159

## 2021-01-06 NOTE — ED Triage Notes (Signed)
Pt presents with left lower dental pain that began about a week ago

## 2021-01-26 ENCOUNTER — Encounter: Payer: Self-pay | Admitting: Emergency Medicine

## 2021-01-26 ENCOUNTER — Other Ambulatory Visit: Payer: Self-pay

## 2021-01-26 ENCOUNTER — Ambulatory Visit
Admission: EM | Admit: 2021-01-26 | Discharge: 2021-01-26 | Disposition: A | Discharge status: Home / Self Care | Payer: Medicaid Other | Attending: Family Medicine | Admitting: Family Medicine

## 2021-01-26 DIAGNOSIS — N3001 Acute cystitis with hematuria: Secondary | ICD-10-CM

## 2021-01-26 DIAGNOSIS — R350 Frequency of micturition: Secondary | ICD-10-CM

## 2021-01-26 LAB — POCT URINALYSIS DIP (MANUAL ENTRY)
Glucose, UA: NEGATIVE mg/dL
Nitrite, UA: POSITIVE — AB
Protein Ur, POC: >=300 mg/dL — AB
Spec Grav, UA: 1.02 (ref 1.010–1.025)
Urobilinogen, UA: 0.2 E.U./dL
pH, UA: 6 (ref 5.0–8.0)

## 2021-01-26 MED ORDER — NITROFURANTOIN MONOHYD MACRO 100 MG PO CAPS: 100.0000 mg | ORAL_CAPSULE | Freq: Two times a day (BID) | ORAL | 0 refills | Status: DC

## 2021-01-26 NOTE — ED Provider Notes (Signed)
[facility]    CSN: [id number] Arrival date & time: 01/26/21  1203      History   Chief Complaint No chief complaint on file.   HPI Dominique Hinton is a 50 y.o. female.   Reports dysuria, low abdominal pain since this morning.  Reports history of UTI about 10 years ago.  Has not attempted OTC treatment.  Pain is worse with urination.  Also reports that her urine is very cloudy.  Denies chills, fever, nausea, vomiting, diarrhea, rash, other symptoms.  ROS per HPI  The history is provided by the patient.    Past Medical History:  Diagnosis Date  . Anxiety   . Arthritis   . Cervical pain   . Family history of adverse reaction to anesthesia    " My Mother has a hard time waking up "  . Hypertension     Patient Active Problem List   Diagnosis Date Noted  . S/P PTCA (percutaneous transluminal coronary angioplasty)   . NSTEMI (non-ST elevated myocardial infarction) ([place]) 09/21/2020  . Chest pain 09/20/2020  . Obesity (BMI 30.0-34.9) 09/20/2020  . Essential hypertension 09/20/2020  . Tobacco abuse 09/20/2020  . Elevated troponin 09/20/2020  . Pain in both upper extremities 06/26/2019  . Weakness 06/26/2019    Past Surgical History:  Procedure Laterality Date  . ABDOMINAL HYSTERECTOMY    . APPENDECTOMY    . BREAST SURGERY    . CARDIAC CATHETERIZATION  09/21/2020  . CHOLECYSTECTOMY    . CORONARY BALLOON ANGIOPLASTY  09/21/2020  . CORONARY/GRAFT ACUTE MI REVASCULARIZATION N/A 09/21/2020   Procedure: Coronary/Graft Acute MI Revascularization;  Surgeon: Jettie Booze, MD;  Location: [place] [facility];  Service: Cardiovascular;  Laterality: N/A;  . LEFT HEART CATH AND CORONARY ANGIOGRAPHY N/A 09/21/2020   Procedure: LEFT HEART CATH AND CORONARY ANGIOGRAPHY;  Surgeon: Jettie Booze, MD;  Location: [place] [facility];  Service: Cardiovascular;  Laterality: N/A;  . TUBAL LIGATION      OB History    Gravida  4   Para  3   Term  3   Preterm       AB  1   Living        SAB  1   IAB      Ectopic      Multiple      Live Births               Home Medications    Prior to Admission medications   Medication Sig Start Date End Date Taking? Authorizing Provider  nitrofurantoin, macrocrystal-monohydrate, (MACROBID) 100 MG capsule Take 1 capsule (100 mg total) by mouth 2 (two) times daily. 01/26/21  Yes Faustino Congress, NP  aspirin 81 MG chewable tablet Chew 1 tablet (81 mg total) by mouth daily. 09/22/20   Cheryln Manly, NP  aspirin 81 MG chewable tablet CHEW 1 TABLET (81 MG TOTAL) BY MOUTH DAILY. 09/22/20 09/22/21  Aletha Halim, MD  chlorhexidine (PERIDEX) 0.12 % solution Use as directed 15 mLs in the mouth or throat 2 (two) times daily. 01/06/21   Wurst, [place], PA-C  citalopram (CELEXA) 20 MG tablet Take 20 mg by mouth daily. 01/21/18   [provider]  DULoxetine (CYMBALTA) 60 MG capsule Take 1 capsule (60 mg total) by mouth daily. 06/26/19   Marcial Pacas, MD  gabapentin (NEURONTIN) 300 MG capsule Take 300 mg by mouth 2 (two) times daily. 08/19/20   [provider]  hydrochlorothiazide (HYDRODIURIL) 25  MG tablet Take 0.5 tablets (12.5 mg total) by mouth daily. 09/29/20   Imogene Burn, PA-C  HYDROcodone-acetaminophen (NORCO) 10-325 MG tablet Take 1 tablet by mouth 4 (four) times daily as needed for pain. Only at night prn 09/16/20   [provider]  ibuprofen (ADVIL) 800 MG tablet Take 1 tablet (800 mg total) by mouth 3 (three) times daily. 01/06/21   Wurst, [place], PA-C  losartan (COZAAR) 100 MG tablet Take 100 mg by mouth daily. 01/26/19   [provider]  meloxicam (MOBIC) 15 MG tablet Take 15 mg by mouth. Monday Wednesday and Friday- whole tablet Sat/Sun -Half tablet 12/08/18   [provider]  nicotine (NICODERM CQ - DOSED IN MG/24 HOURS) 14 mg/24hr patch Place 1 patch (14 mg total) onto the skin daily. 09/29/20   Imogene Burn, PA-C  nicotine (NICODERM CQ - DOSED  IN MG/24 HR) 7 mg/24hr patch Place 1 patch (7 mg total) onto the skin daily. 09/29/20   Imogene Burn, PA-C  nitroGLYCERIN (NITROSTAT) 0.4 MG SL tablet Place 1 tablet (0.4 mg total) under the tongue every 5 (five) minutes as needed for chest pain. 09/22/20   Cheryln Manly, NP  nitroGLYCERIN (NITROSTAT) 0.4 MG SL tablet PLACE 1 TABLET (0.4 MG TOTAL) UNDER THE TONGUE EVERY FIVE MINUTES AS NEEDED FOR CHEST PAIN. 09/22/20 09/22/21  Reino Bellis B, NP  pantoprazole (PROTONIX) 40 MG tablet Take 1 tablet (40 mg total) by mouth daily. 09/22/20   Cheryln Manly, NP  pantoprazole (PROTONIX) 40 MG tablet TAKE 1 TABLET (40 MG TOTAL) BY MOUTH DAILY. 09/22/20 09/22/21  Cheryln Manly, NP  rosuvastatin (CRESTOR) 20 MG tablet Take 1 tablet (20 mg total) by mouth daily at 6 PM. 09/22/20   Cheryln Manly, NP  ticagrelor (BRILINTA) 90 MG TABS tablet Take 1 tablet (90 mg total) by mouth 2 (two) times daily. 09/22/20   Cheryln Manly, NP  tiZANidine (ZANAFLEX) 4 MG tablet Take 4-8 mg by mouth 3 (three) times daily as needed for muscle spasms. 09/16/20   [provider]  valACYclovir (VALTREX) 1000 MG tablet Take 1,000 mg by mouth daily. 05/06/19   [provider]    Family History Family History  Problem Relation Age of Onset  . Other Mother        "borderline diabetic"  . Alzheimer's disease Father   . Hypertension Other     Social History Social History   Tobacco Use  . Smoking status: Current Every Day Smoker    Packs/day: 1.00    Types: Cigarettes  . Smokeless tobacco: Never Used  Vaping Use  . Vaping Use: Never used  Substance Use Topics  . Alcohol use: Yes    Comment: occasionally  . Drug use: Yes    Types: Marijuana    Comment: occasionally     Allergies   Cortisone and Doxycycline   Review of Systems Review of Systems   Physical Exam Triage Vital Signs ED Triage Vitals  Enc Vitals Group     BP 01/26/21 1213 101/69     Pulse Rate 01/26/21 1213  89     Resp 01/26/21 1213 16     Temp 01/26/21 1213 98.9 F (37.2 C)     Temp Source 01/26/21 1213 Oral     SpO2 01/26/21 1213 99 %     Weight --      Height --      Head Circumference --      Peak  Flow --      Pain Score 01/26/21 1214 10     Pain Loc --      Pain Edu? --      Excl. in [place]? --    No data found.  Updated Vital Signs BP 101/69 (BP Location: Right Arm)   Pulse 89   Temp 98.9 F (37.2 C) (Oral)   Resp 16   SpO2 99%   Visual Acuity Right Eye Distance:   Left Eye Distance:   Bilateral Distance:    Right Eye Near:   Left Eye Near:    Bilateral Near:     Physical Exam Vitals and nursing note reviewed.  Constitutional:      General: She is not in acute distress.    Appearance: Normal appearance. She is well-developed and normal weight. She is not ill-appearing.  HENT:     Head: Normocephalic and atraumatic.     Nose: Nose normal.     Mouth/Throat:     Mouth: Mucous membranes are moist.     Pharynx: Oropharynx is clear.  Eyes:     Extraocular Movements: Extraocular movements intact.     Conjunctiva/sclera: Conjunctivae normal.     Pupils: Pupils are equal, round, and reactive to light.  Cardiovascular:     Rate and Rhythm: Normal rate and regular rhythm.  Pulmonary:     Effort: Pulmonary effort is normal.  Abdominal:     General: There is no distension.     Palpations: Abdomen is soft. There is no mass.     Tenderness: There is abdominal tenderness (Suprapubic). There is no right CVA tenderness, left CVA tenderness, guarding or rebound.     Hernia: No hernia is present.  Musculoskeletal:        General: Normal range of motion.     Cervical back: Normal range of motion and neck supple.  Skin:    General: Skin is warm and dry.     Capillary Refill: Capillary refill takes less than 2 seconds.  Neurological:     General: No focal deficit present.     Mental Status: She is alert and oriented to person, place, and time.  Psychiatric:        Mood and  Affect: Mood normal.        Behavior: Behavior normal.        Thought Content: Thought content normal.      UC Treatments / Results  Labs (all labs ordered are listed, but only abnormal results are displayed) Labs Reviewed  POCT URINALYSIS DIP (MANUAL ENTRY) - Abnormal; Notable for the following components:      Result Value   Clarity, UA cloudy (*)    Bilirubin, UA small (*)    Ketones, POC UA trace (5) (*)    Blood, UA large (*)    Protein Ur, POC >=300 (*)    Nitrite, UA Positive (*)    Leukocytes, UA Small (1+) (*)    All other components within normal limits    EKG   Radiology No results found.  Procedures Procedures (including critical care time)  Medications Ordered in UC Medications - No data to display  Initial Impression / Assessment and Plan / UC Course  I have reviewed the triage vital signs and the nursing notes.  Pertinent labs & imaging results that were available during my care of the patient were reviewed by me and considered in my medical decision making (see chart for details).    Acute cystitis with  hematuria Urinary frequency  UA concerning for infection today Macrobid prescribed twice daily x5 days Will culture urine and will be in contact with any abnormal results that require further treatment May take AZO over-the-counter for dysuria Push fluids and get plenty of rest Follow up with this office or with primary care if symptoms are persisting.  Follow up in the ER for high fever, trouble swallowing, trouble breathing, other concerning symptoms.  Final Clinical Impressions(s) / UC Diagnoses   Final diagnoses:  Acute cystitis with hematuria  Urinary frequency     Discharge Instructions     I have sent in [place] for you to take twice a day for you to take twice a day for 5 days  You may have a urinary tract infection.   We are going to culture your urine and will call you as soon as we have the results.   Drink plenty of  water, 8-10 glasses per day.   You may take AZO over the counter for painful urination.  Follow up with your primary care provider as needed.   Go to the Emergency Department if you experience severe pain, shortness of breath, high fever, or other concerns.       ED Prescriptions    Medication Sig Dispense Auth. Provider   nitrofurantoin, macrocrystal-monohydrate, (MACROBID) 100 MG capsule Take 1 capsule (100 mg total) by mouth 2 (two) times daily. 10 capsule Faustino Congress, NP     PDMP not reviewed this encounter.   Faustino Congress, NP 01/26/21 1500

## 2021-01-26 NOTE — Discharge Instructions (Signed)
I have sent in [place] for you to take twice a day for you to take twice a day for 5 days  You may have a urinary tract infection.   We are going to culture your urine and will call you as soon as we have the results.   Drink plenty of water, 8-10 glasses per day.   You may take AZO over the counter for painful urination.  Follow up with your primary care provider as needed.   Go to the Emergency Department if you experience severe pain, shortness of breath, high fever, or other concerns.

## 2021-01-26 NOTE — ED Triage Notes (Signed)
Pain on urination that started today.

## 2021-02-01 DIAGNOSIS — M1990 Unspecified osteoarthritis, unspecified site: Secondary | ICD-10-CM | POA: Diagnosis not present

## 2021-02-01 DIAGNOSIS — I2510 Atherosclerotic heart disease of native coronary artery without angina pectoris: Secondary | ICD-10-CM | POA: Diagnosis not present

## 2021-02-07 ENCOUNTER — Telehealth: Payer: Self-pay | Admitting: Internal Medicine

## 2021-02-07 DIAGNOSIS — M47816 Spondylosis without myelopathy or radiculopathy, lumbar region: Secondary | ICD-10-CM | POA: Diagnosis not present

## 2021-02-07 NOTE — Telephone Encounter (Signed)
..   Medicaid Managed Care   Unsuccessful Outreach Note  02/07/2021 Name: Dominique Hinton MRN: [id number] DOB: [birth year]  Referred by: Asencion Noble, MD Reason for referral : High Risk Managed Medicaid (Attempted to reach Ms.Pryce to get her scheduled with the [facility] team for a phone visit. I had to leave a message on her VM.)   Third unsuccessful telephone outreach was attempted today. The patient was referred to the case management team for assistance with care management and care coordination. The patient's primary care provider has been notified of our unsuccessful attempts to make or maintain contact with the patient. The care management team is pleased to engage with this patient at any time in the future should he/she be interested in assistance from the care management team.   Follow Up Plan: We have been unable to make contact with the patient for follow up. The care management team is available to follow up with the patient after provider conversation with the patient regarding recommendation for care management engagement and subsequent re-referral to the care management team.   [place], [place]

## 2021-02-14 ENCOUNTER — Telehealth: Payer: Self-pay | Admitting: Cardiology

## 2021-02-14 NOTE — Telephone Encounter (Signed)
Dr. Domenic Polite  This patient is to undergo L5 spinal epidural with request to hold Brilinta. She recently had a NSTEMI with balloon angioplasty only to a totally occluded D1 lesion felt too small for stenting. She has been seen several times in follow up and continued to smoke, not following diet recommendations however was compliant with her medications. My question is in regards to holding Brilinta given no stent placement. Will she need to post-post spinal injection until early next year for one year of uninterrupted DAPT?   Please send your recs to the pre op pool  Thank you

## 2021-02-14 NOTE — Telephone Encounter (Signed)
   [facility] Pre-operative Risk Assessment    HEARTCARE STAFF: - Please ensure there is not already an duplicate clearance open for this procedure. - Under Visit Info/Reason for Call, type in Other and utilize the format Clearance MM/DD/YY or Clearance TBD. Do not use dashes or single digits. - If request is for dental extraction, please clarify the # of teeth to be extracted.  Request for surgical clearance:  What type of surgery is being performed? L5 Spine epideral   When is this surgery scheduled? TBD  What type of clearance is required (medical clearance vs. Pharmacy clearance to hold med vs. Both)? Pharmacy  Are there any medications that need to be held prior to surgery and how long? [place] name and name of physician performing surgery? [facility] - Dr. Doreatha Martin   What is the office phone number? [phone]   7.   What is the office fax number? [phone]  8.   Anesthesia type (None, local, MAC, general) ? TBD   Nelda Marseille 02/14/2021, 2:57 PM  _________________________________________________________________   (provider comments below)

## 2021-02-15 NOTE — Telephone Encounter (Signed)
    CAITRIN PENDERGRAPH DOB:  1970/10/28  MRN:  [id number]   Primary Cardiologist: Rozann Lesches, MD  Chart reviewed as part of pre-operative protocol coverage. Given past medical history and time since last visit, based on ACC/AHA guidelines, MERRANDA BOLLS would be at acceptable risk for the planned procedure without further cardiovascular testing.   She recently had a NSTEMI with balloon angioplasty only to a totally occluded D1 lesion felt too small for stenting. Given that her event is now 6 months out, it is felt that holding Brilinta for 5 days prior to procedure is reasonable at this point. She will need to restart this as soon as possible thereafter once ok from a procedural standpoint. Case reviewed with Dr. Domenic Polite as well.   I will route this recommendation to the requesting party via Epic fax function and remove from pre-op pool.  Please call with questions.  Kathyrn Drown, NP 02/15/2021, 8:05 AM

## 2021-02-22 DIAGNOSIS — M5416 Radiculopathy, lumbar region: Secondary | ICD-10-CM | POA: Diagnosis not present

## 2021-03-10 DIAGNOSIS — M5416 Radiculopathy, lumbar region: Secondary | ICD-10-CM | POA: Diagnosis not present

## 2021-03-17 DIAGNOSIS — M545 Low back pain, unspecified: Secondary | ICD-10-CM | POA: Diagnosis not present

## 2021-03-22 ENCOUNTER — Other Ambulatory Visit: Payer: Self-pay

## 2021-03-22 ENCOUNTER — Ambulatory Visit: Payer: Medicaid Other | Admitting: Cardiology

## 2021-03-22 ENCOUNTER — Encounter: Payer: Self-pay | Admitting: Cardiology

## 2021-03-22 VITALS — BP 114/76 | HR 86 | Ht 66.0 in | Wt 191.1 lb

## 2021-03-22 DIAGNOSIS — I25119 Atherosclerotic heart disease of native coronary artery with unspecified angina pectoris: Secondary | ICD-10-CM | POA: Diagnosis not present

## 2021-03-22 DIAGNOSIS — E782 Mixed hyperlipidemia: Secondary | ICD-10-CM | POA: Diagnosis not present

## 2021-03-22 MED ORDER — ROSUVASTATIN CALCIUM 20 MG PO TABS: 20.0000 mg | ORAL_TABLET | Freq: Every day | ORAL | 3 refills | Status: DC

## 2021-03-22 MED ORDER — CLOPIDOGREL BISULFATE 75 MG PO TABS: 75.0000 mg | ORAL_TABLET | Freq: Every day | ORAL | 3 refills | Status: DC

## 2021-03-22 NOTE — Progress Notes (Signed)
Cardiology Office Note  Date: 03/22/2021   ID: Gaynelle Cage, DOB [birth year], MRN [id number]  PCP:  Asencion Noble, MD  Cardiologist:  Rozann Lesches, MD Electrophysiologist:  None   Chief Complaint  Patient presents with   Cardiac follow-up     History of Present Illness: Dominique Hinton is a 50 y.o. female last seen in March by Ms. Bonnell Public PA-C.  I reviewed her records and updated the chart.  She is here for a routine visit.  Reports no angina symptoms or nitroglycerin use and otherwise states that she has been compliant with her medications.  She is having trouble with lower back pain with lumbar disc disease, undergoing orthopedic evaluation.  Not certain whether she will require surgery or not.  We went over her medications and discussed switching from Brilinta to Plavix when she completes her current refill.  We will stop DAPT at the end of the year.  Requesting interval lipid panel from Dr. Willey Blade.  Past Medical History:  Diagnosis Date   Anxiety    Arthritis    Back pain    CAD (coronary artery disease)    Angioplasty of D1 January 2022   Essential hypertension    NSTEMI (non-ST elevated myocardial infarction) [facility])    January 2022    Past Surgical History:  Procedure Laterality Date   ABDOMINAL HYSTERECTOMY     APPENDECTOMY     BREAST SURGERY     CARDIAC CATHETERIZATION  09/21/2020   CHOLECYSTECTOMY     CORONARY BALLOON ANGIOPLASTY  09/21/2020   CORONARY/GRAFT ACUTE MI REVASCULARIZATION N/A 09/21/2020   Procedure: Coronary/Graft Acute MI Revascularization;  Surgeon: Jettie Booze, MD;  Location: [place] [facility];  Service: Cardiovascular;  Laterality: N/A;   LEFT HEART CATH AND CORONARY ANGIOGRAPHY N/A 09/21/2020   Procedure: LEFT HEART CATH AND CORONARY ANGIOGRAPHY;  Surgeon: Jettie Booze, MD;  Location: [place] [facility];  Service: Cardiovascular;  Laterality: N/A;   TUBAL LIGATION      Current Outpatient Medications  Medication Sig  Dispense Refill   aspirin 81 MG chewable tablet Chew 1 tablet (81 mg total) by mouth daily. 90 tablet 1   aspirin 81 MG chewable tablet CHEW 1 TABLET (81 MG TOTAL) BY MOUTH DAILY. 90 tablet 1   chlorhexidine (PERIDEX) 0.12 % solution Use as directed 15 mLs in the mouth or throat 2 (two) times daily. 473 mL 0   citalopram (CELEXA) 20 MG tablet Take 20 mg by mouth daily.  3   clopidogrel (PLAVIX) 75 MG tablet Take 1 tablet (75 mg total) by mouth daily. 90 tablet 3   DULoxetine (CYMBALTA) 60 MG capsule Take 1 capsule (60 mg total) by mouth daily. 30 capsule 12   gabapentin (NEURONTIN) 300 MG capsule Take 300 mg by mouth 2 (two) times daily.     hydrochlorothiazide (HYDRODIURIL) 25 MG tablet Take 0.5 tablets (12.5 mg total) by mouth daily. 45 tablet 3   HYDROcodone-acetaminophen (NORCO) 10-325 MG tablet Take 1 tablet by mouth 4 (four) times daily as needed for pain. Only at night prn     ibuprofen (ADVIL) 800 MG tablet Take 1 tablet (800 mg total) by mouth 3 (three) times daily. 21 tablet 0   losartan (COZAAR) 100 MG tablet Take 100 mg by mouth daily.     meloxicam (MOBIC) 15 MG tablet Take 15 mg by mouth. Monday Wednesday and Friday- whole tablet Sat/Sun -Half tablet     nitrofurantoin, macrocrystal-monohydrate, (MACROBID) 100 MG capsule  Take 1 capsule (100 mg total) by mouth 2 (two) times daily. 10 capsule 0   nitroGLYCERIN (NITROSTAT) 0.4 MG SL tablet PLACE 1 TABLET (0.4 MG TOTAL) UNDER THE TONGUE EVERY FIVE MINUTES AS NEEDED FOR CHEST PAIN. 25 tablet 2   pantoprazole (PROTONIX) 40 MG tablet TAKE 1 TABLET (40 MG TOTAL) BY MOUTH DAILY. 30 tablet 1   tiZANidine (ZANAFLEX) 4 MG tablet Take 4-8 mg by mouth 3 (three) times daily as needed for muscle spasms.     valACYclovir (VALTREX) 1000 MG tablet Take 1,000 mg by mouth daily.     nicotine (NICODERM CQ - DOSED IN MG/24 HOURS) 14 mg/24hr patch Place 1 patch (14 mg total) onto the skin daily. (Patient not taking: Reported on 03/22/2021) 28 patch 0    nicotine (NICODERM CQ - DOSED IN MG/24 HR) 7 mg/24hr patch Place 1 patch (7 mg total) onto the skin daily. (Patient not taking: Reported on 03/22/2021) 14 patch 0   rosuvastatin (CRESTOR) 20 MG tablet Take 1 tablet (20 mg total) by mouth daily at 6 PM. 90 tablet 3   No current facility-administered medications for this visit.   Allergies:  Cortisone and Doxycycline   ROS: No palpitations or syncope.  Physical Exam: VS:  BP 114/76   Pulse 86   Ht 5\' 6"  (1.676 m)   Wt 191 lb 1.6 oz (86.7 kg)   SpO2 97%   BMI 30.84 kg/m , BMI Body mass index is 30.84 kg/m.  Wt Readings from Last 3 Encounters:  03/22/21 191 lb 1.6 oz (86.7 kg)  11/29/20 184 lb 9.6 oz (83.7 kg)  09/29/20 185 lb (83.9 kg)    General: Patient appears comfortable at rest. HEENT: Conjunctiva and lids normal, wearing a mask. Neck: Supple, no elevated JVP or carotid bruits, no thyromegaly. Lungs: Clear to auscultation, nonlabored breathing at rest. Cardiac: Regular rate and rhythm, no S3 or significant systolic murmur, no pericardial rub. Extremities: No pitting edema.  ECG:  An ECG dated 09/21/2020 was personally reviewed today and demonstrated:  Sinus rhythm with decreased R wave progression.  Recent Labwork: 09/21/2020: ALT 54; AST 50; Magnesium 2.0 09/22/2020: BUN 7; Creatinine, Ser 0.79; Hemoglobin 14.9; Platelets 261; Potassium 3.7; Sodium 136     Component Value Date/Time   CHOL 185 09/22/2020 0301   TRIG 179 (H) 09/22/2020 0301   HDL 24 (L) 09/22/2020 0301   CHOLHDL 7.7 09/22/2020 0301   VLDL 36 09/22/2020 0301   LDLCALC 125 (H) 09/22/2020 0301    Other Studies Reviewed Today:  Cardiac catheterization 09/21/2020: 1st Mrg lesion is 75% stenosed. This is a very small vessel. Lat 1st Diag lesion is 100% stenosed. Given the wall motion abnormality on ventriculogram, we decided to intervene. Balloon angioplasty was performed using a BALLOON SAPPHIRE 1.25X10. It turned out to be a small vessel. Post  intervention, there is a 25% residual stenosis. The vessel was too small for stenting. The left ventricular systolic function is normal. LV end diastolic pressure is normal. The left ventricular ejection fraction is 50-55% by visual estimate. There is no aortic valve stenosis.   Continue aggressive medical therapy with DAPT.  Could change Brilinta to Plavix since no stent was placed.  Echocardiogram 09/21/2020:  1. Left ventricular ejection fraction, by estimation, is 55 to 60%. The  left ventricle has normal function. The left ventricle has no regional  wall motion abnormalities. There is mild left ventricular hypertrophy.  Left ventricular diastolic parameters  are indeterminate.   2. Right ventricular  systolic function is normal. The right ventricular  size is normal. Tricuspid regurgitation signal is inadequate for assessing  PA pressure.   3. The mitral valve is grossly normal. Trivial mitral valve  regurgitation.   4. The aortic valve is tricuspid. Aortic valve regurgitation is not  visualized.   5. The inferior vena cava is normal in size with greater than 50%  respiratory variability, suggesting right atrial pressure of 3 mmHg.   Assessment and Plan:  1.  History of NSTEMI in January of this year managed with balloon angioplasty to culprit first diagonal branch, no stent intervention.  Plan to switch from Brilinta to Plavix to complete DAPT course for 1 year and then go to aspirin alone.  Otherwise continue Cozaar and Crestor.  2.  Mixed hyperlipidemia, tolerating Crestor.  Refill provided.  Requesting interval FLP from Dr. Willey Blade.  Medication Adjustments/Labs and Tests Ordered: Current medicines are reviewed at length with the patient today.  Concerns regarding medicines are outlined above.   Tests Ordered: No orders of the defined types were placed in this encounter.   Medication Changes: Meds ordered this encounter  Medications   clopidogrel (PLAVIX) 75 MG tablet     Sig: Take 1 tablet (75 mg total) by mouth daily.    Dispense:  90 tablet    Refill:  3   rosuvastatin (CRESTOR) 20 MG tablet    Sig: Take 1 tablet (20 mg total) by mouth daily at 6 PM.    Dispense:  90 tablet    Refill:  3     Disposition:  Follow up  6 months.  Signed, Satira Sark, MD, [facility] 03/22/2021 1:19 PM    [place] at [facility] [address] [place], [place] [address] Phone: [phone]; Fax: [phone]

## 2021-03-22 NOTE — Patient Instructions (Signed)
Medication Instructions:  Your physician has recommended you make the following change in your medication:  START Plavix 75 mg tablets once daily AFTER you finish Brilinta.   *If you need a refill on your cardiac medications before your next appointment, please call your pharmacy*   Lab Work: We have requested your most recent lipid panel from your primary care provider If you have labs (blood work) drawn today and your tests are completely normal, you will receive your results only by: [place] (if you have MyChart) OR A paper copy in the mail If you have any lab test that is abnormal or we need to change your treatment, we will call you to review the results.   Testing/Procedures: None   Follow-Up: At [facility] you and your health needs are our priority.  As part of our continuing mission to provide you with exceptional heart care, we have created designated Provider Care Teams.  These Care Teams include your primary Cardiologist (physician) and Advanced Practice Providers (APPs -  Physician Assistants and Nurse Practitioners) who all work together to provide you with the care you need, when you need it.  We recommend signing up for the patient portal called "MyChart".  Sign up information is provided on this After Visit Summary.  MyChart is used to connect with patients for Virtual Visits (Telemedicine).  Patients are able to view lab/test results, encounter notes, upcoming appointments, etc.  Non-urgent messages can be sent to your provider as well.   To learn more about what you can do with MyChart, go to [email or web address].    Your next appointment:   6 month(s)  The format for your next appointment:   In Person  Provider:   Rozann Lesches, MD   Other Instructions

## 2021-03-25 ENCOUNTER — Telehealth: Payer: Self-pay | Admitting: Cardiology

## 2021-03-25 DIAGNOSIS — M5416 Radiculopathy, lumbar region: Secondary | ICD-10-CM | POA: Diagnosis not present

## 2021-03-25 NOTE — Telephone Encounter (Signed)
   [facility] Pre-operative Risk Assessment    Patient Name: Dominique Hinton  DOB: 07-20-1971 MRN: [id number]  HEARTCARE STAFF:  - IMPORTANT!!!!!! Under Visit Info/Reason for Call, type in Other and utilize the format Clearance MM/DD/YY or Clearance TBD. Do not use dashes or single digits. - Please review there is not already an duplicate clearance open for this procedure. - If request is for dental extraction, please clarify the # of teeth to be extracted. - If the patient is currently at the dentist's office, call Pre-Op Callback Staff (MA/nurse) to input urgent request.  - If the patient is not currently in the dentist office, please route to the Pre-Op pool.  Request for surgical clearance:  What type of surgery is being performed : injection clearance  When is this surgery scheduled TBD  What type of clearance is required (medical clearance vs. Pharmacy clearance to hold med vs. Both)? both  Are there any medications that need to be held prior to surgery and how long? Please advise   Practice name and name of physician performing surgery? [facility] Dore[facility]  What is the office phone number? [phone]   7.   What is the office fax number? [phone]   8.   Anesthesia type (None, local, MAC, general) ? Not specified-  patient is to get an injection ?   Jannet Askew 03/25/2021, 12:57 PM  _________________________________________________________________   (provider comments below)

## 2021-03-28 NOTE — Telephone Encounter (Signed)
Khadija C. Chernick 50 year old female is requesting preoperative cardiac evaluation for spinal injection.  She was last seen in the clinic on 03/22/2021.  During that time she was doing well.  She denied chest pain and nitroglycerin use.  She was told to finish her Brilinta and start taking Plavix.  Plan was made for continuing dual antiplatelet therapy for 1 year.  Her PMH includes anxiety, arthritis, back pain, coronary artery disease status post angioplasty 1/22, and hypertension.   May her Plavix be held prior to her procedure?  Thank you for your help.  Please direct your response to CV DIV preop pool.  Jossie Ng. Leiana Rund NP-C    03/28/2021, 3:03 PM [place] [address] Office [phone] Fax [phone]

## 2021-03-28 NOTE — Telephone Encounter (Addendum)
   Primary Cardiologist: Rozann Lesches, MD  Chart reviewed as part of pre-operative protocol coverage. Given past medical history and time since last visit, based on ACC/AHA guidelines, Dominique Hinton would be at acceptable risk for the planned procedure without further cardiovascular testing.   Her Plavix and aspirin may be held for 5-7 days prior to her injection.  Please resume Plavix and aspirin as soon as hemostasis is achieved.  I will route this recommendation to the requesting party via Epic fax function and remove from pre-op pool.  Please call with questions.  Dominique Ng. Amari Burnsworth NP-C    03/28/2021, 3:17 PM [place] [address] [address] Office [phone] Fax [phone]

## 2021-04-19 DIAGNOSIS — M47816 Spondylosis without myelopathy or radiculopathy, lumbar region: Secondary | ICD-10-CM | POA: Diagnosis not present

## 2021-04-19 DIAGNOSIS — M5416 Radiculopathy, lumbar region: Secondary | ICD-10-CM | POA: Diagnosis not present

## 2021-05-12 DIAGNOSIS — M47816 Spondylosis without myelopathy or radiculopathy, lumbar region: Secondary | ICD-10-CM | POA: Diagnosis not present

## 2021-05-17 DIAGNOSIS — I10 Essential (primary) hypertension: Secondary | ICD-10-CM | POA: Diagnosis not present

## 2021-05-17 DIAGNOSIS — I2510 Atherosclerotic heart disease of native coronary artery without angina pectoris: Secondary | ICD-10-CM | POA: Diagnosis not present

## 2021-07-04 DIAGNOSIS — M47816 Spondylosis without myelopathy or radiculopathy, lumbar region: Secondary | ICD-10-CM | POA: Diagnosis not present

## 2021-07-05 ENCOUNTER — Telehealth: Payer: Self-pay | Admitting: Cardiology

## 2021-07-05 MED ORDER — CLOPIDOGREL BISULFATE 75 MG PO TABS: 75.0000 mg | ORAL_TABLET | Freq: Every day | ORAL | 3 refills | Status: DC

## 2021-07-05 NOTE — Telephone Encounter (Signed)
*  STAT* If patient is at the pharmacy, call can be transferred to refill team.   1. Which medications need to be refilled? (please list name of each medication and dose if known)   clopidogrel (PLAVIX) 75 MG tablet    2. Which pharmacy/location (including street and city if local pharmacy) is medication to be sent to? [place], [place] [address]  3. Do they need a 30 day or 90 day supply? 90 ds

## 2021-07-05 NOTE — Telephone Encounter (Signed)
Medication refill request for Plavix 75 mg tablets approved and sent to [facility] per pt request.

## 2021-07-18 ENCOUNTER — Other Ambulatory Visit: Payer: Self-pay

## 2021-07-18 MED ORDER — CLOPIDOGREL BISULFATE 75 MG PO TABS: 75.0000 mg | ORAL_TABLET | Freq: Every day | ORAL | 3 refills | Status: DC

## 2021-07-27 DIAGNOSIS — M47816 Spondylosis without myelopathy or radiculopathy, lumbar region: Secondary | ICD-10-CM | POA: Diagnosis not present

## 2021-08-03 ENCOUNTER — Other Ambulatory Visit: Payer: Self-pay

## 2021-08-03 ENCOUNTER — Ambulatory Visit
Admission: EM | Admit: 2021-08-03 | Discharge: 2021-08-03 | Disposition: A | Discharge status: Home / Self Care | Payer: Medicaid Other | Attending: Family Medicine | Admitting: Family Medicine

## 2021-08-03 DIAGNOSIS — R232 Flushing: Secondary | ICD-10-CM

## 2021-08-03 NOTE — ED Triage Notes (Signed)
Patient states she has had hot flashes since yesterday morning at 5am.   She states she is sweating more normal.  Patient states she has not been able to eat or smoke her marijuana.

## 2021-08-03 NOTE — ED Provider Notes (Addendum)
[facility]   [id number] 08/03/21 Arrival Time: 0840  ASSESSMENT & PLAN:  1. Hot flashes    Less than 24 hours. Unclear etiology. Ques early symptoms of viral illness. COVID-19/influenza testing sent. Declines lab work. OTC symptom care as needed.  Recommend:  Follow-up Information     Schedule an appointment as soon as possible for a visit  with [facility].   Specialty: Obstetrics and Gynecology Contact information: [address] [place] [phone]                Reviewed expectations re: course of current medical issues. Questions answered. Outlined signs and symptoms indicating need for more acute intervention. Understanding verbalized. After Visit Summary given.   SUBJECTIVE: History from: patient. Dominique Hinton is a 50 y.o. female who reports: hot flashes; off/on; since yest am; afebrile. No URI symptoms. Denies: runny nose, congestion, cough, sore throat, difficulty breathing, and headache. No CP. Normal PO intake without n/v/d.   OBJECTIVE:  Vitals:   08/03/21 1026  BP: 126/86  Pulse: 67  Resp: 20  Temp: 98.4 F (36.9 C)  TempSrc: Oral  SpO2: 96%    General appearance: alert; no distress Eyes: PERRLA; EOMI; conjunctiva normal HENT: [place]; AT; without nasal congestion Neck: supple  CV: regular Lungs: speaks full sentences without difficulty; unlabored; clear Extremities: no edema Skin: warm and dry Neurologic: normal gait Psychological: alert and cooperative; normal mood and affect  Labs:  Labs Reviewed  COVID-19, FLU A+B NAA    Allergies  Allergen Reactions   Cortisone Shortness Of Breath and Other (See Comments)    Made me feel loopy   Doxycycline Rash    Past Medical History:  Diagnosis Date   Anxiety    Arthritis    Back pain    CAD (coronary artery disease)    Angioplasty of D1 January 2022   Essential hypertension    NSTEMI (non-ST elevated myocardial  infarction) [facility])    January 2022   Social History   Socioeconomic History   Marital status: Legally Separated    Spouse name: Not on file   Number of children: 3   Years of education: 12   Highest education level: Not on file  Occupational History   Occupation: waitress/cook  Tobacco Use   Smoking status: Every Day    Packs/day: 1.00    Types: Cigarettes   Smokeless tobacco: Never  Vaping Use   Vaping Use: Never used  Substance and Sexual Activity   Alcohol use: Yes    Comment: occasionally   Drug use: Yes    Types: Marijuana    Comment: occasionally   Sexual activity: Yes    Birth control/protection: None  Other Topics Concern   Not on file  Social History Narrative   Lives at home with daughter, Joslyn Hy and fiance.   10-12 cups caffeine per day.   Right-handed.   Social Determinants of Health   Financial Resource Strain: Not on file  Food Insecurity: Not on file  Transportation Needs: Not on file  Physical Activity: Not on file  Stress: Not on file  Social Connections: Not on file  Intimate Partner Violence: Not on file   Family History  Problem Relation Age of Onset   Other Mother        "borderline diabetic"   Alzheimer's disease Father    Hypertension Other    Past Surgical History:  Procedure Laterality Date   ABDOMINAL HYSTERECTOMY  APPENDECTOMY     BREAST SURGERY     CARDIAC CATHETERIZATION  09/21/2020   CHOLECYSTECTOMY     CORONARY BALLOON ANGIOPLASTY  09/21/2020   CORONARY/GRAFT ACUTE MI REVASCULARIZATION N/A 09/21/2020   Procedure: Coronary/Graft Acute MI Revascularization;  Surgeon: Jettie Booze, MD;  Location: [place] [facility];  Service: Cardiovascular;  Laterality: N/A;   LEFT HEART CATH AND CORONARY ANGIOGRAPHY N/A 09/21/2020   Procedure: LEFT HEART CATH AND CORONARY ANGIOGRAPHY;  Surgeon: Jettie Booze, MD;  Location: [place] [facility];  Service: Cardiovascular;  Laterality: N/A;   TUBAL LIGATION       Vanessa Kick, MD 08/03/21 [place]    Vanessa Kick, MD 08/03/21 1051

## 2021-08-03 NOTE — Discharge Instructions (Signed)
You have been tested for COVID-19/influenza today. If your test returns positive, you will receive a phone call from [facility] regarding your results. Negative test results are not called. Both positive and negative results area always visible on MyChart. If you do not have a MyChart account, sign up instructions are provided in your discharge papers. Please do not hesitate to contact us should you have questions or concerns.

## 2021-08-05 LAB — COVID-19, FLU A+B NAA
Influenza A, NAA: NOT DETECTED
Influenza B, NAA: NOT DETECTED
SARS-CoV-2, NAA: NOT DETECTED

## 2021-08-09 DIAGNOSIS — M47816 Spondylosis without myelopathy or radiculopathy, lumbar region: Secondary | ICD-10-CM | POA: Diagnosis not present

## 2021-08-12 DIAGNOSIS — M47816 Spondylosis without myelopathy or radiculopathy, lumbar region: Secondary | ICD-10-CM | POA: Diagnosis not present

## 2021-08-23 ENCOUNTER — Other Ambulatory Visit: Payer: Self-pay

## 2021-08-23 ENCOUNTER — Encounter: Payer: Self-pay | Admitting: Adult Health

## 2021-08-23 ENCOUNTER — Ambulatory Visit: Payer: Medicaid Other | Admitting: Adult Health

## 2021-08-23 VITALS — BP 116/81 | HR 70 | Ht 65.0 in | Wt 183.0 lb

## 2021-08-23 DIAGNOSIS — F32A Depression, unspecified: Secondary | ICD-10-CM

## 2021-08-23 DIAGNOSIS — R102 Pelvic and perineal pain: Secondary | ICD-10-CM

## 2021-08-23 DIAGNOSIS — R232 Flushing: Secondary | ICD-10-CM

## 2021-08-23 DIAGNOSIS — R110 Nausea: Secondary | ICD-10-CM

## 2021-08-23 DIAGNOSIS — Z720 Tobacco use: Secondary | ICD-10-CM | POA: Diagnosis not present

## 2021-08-23 DIAGNOSIS — Z1231 Encounter for screening mammogram for malignant neoplasm of breast: Secondary | ICD-10-CM

## 2021-08-23 DIAGNOSIS — Z90710 Acquired absence of both cervix and uterus: Secondary | ICD-10-CM

## 2021-08-23 DIAGNOSIS — F419 Anxiety disorder, unspecified: Secondary | ICD-10-CM | POA: Diagnosis not present

## 2021-08-23 DIAGNOSIS — I252 Old myocardial infarction: Secondary | ICD-10-CM

## 2021-08-23 MED ORDER — CITALOPRAM HYDROBROMIDE 20 MG PO TABS: ORAL_TABLET | ORAL | 2 refills | Status: DC

## 2021-08-23 MED ORDER — PROMETHAZINE HCL 25 MG PO TABS: 25.0000 mg | ORAL_TABLET | Freq: Four times a day (QID) | ORAL | 1 refills | Status: DC | PRN

## 2021-08-23 NOTE — Progress Notes (Signed)
Patient ID: Dominique Hinton, female   DOB: 03/02/1971, 50 y.o.   MRN: [id number] History of Present Illness: Dominique Hinton is a 50 year old white female, divorced, sp hysterectomy in complaining of hot flashes and sweating and nausea, can't eat for last week or so, and pain low pelvic area. She had MI in January and angioplasty. She took 2 mg estradiol tablet from her aunt and felt better. Told to stop those. She is on plavix.  Dr Willey Blade had called in clonidine but she has not picked those up yet. PCP is Dr Willey Blade    Current Medications, Allergies, Past Medical History, Past Surgical History, Family History and Social History were reviewed in [facility] record.     Review of Systems: +hot flashes +sweating +nausea +pelvic pain  Reviewed past medical,surgical, social and family history. Reviewed medications and allergies.     Physical Exam:BP 116/81 (BP Location: Left Arm, Patient Position: Sitting, Cuff Size: Normal)    Pulse 70    Ht 5\' 5"  (1.651 m)    Wt 183 lb (83 kg)    BMI 30.45 kg/m   General:  Well developed, well nourished, no acute distress Skin:  Warm and dry Lungs; Clear to auscultation bilaterally Cardiovascular: Regular rate and rhythm Pelvic:  External genitalia is normal in appearance, no lesions.  The vagina is normal in appearance. Urethra has no lesions or masses. The cervix and uterus are absent. No adnexal masses, + bilateral tenderness noted.Bladder is non tender, no masses felt. Rectal: deferred Extremities/musculoskeletal:  No swelling or varicosities noted, no clubbing or cyanosis Psych:  No mood changes, alert and cooperative,seems happy AA is 1 Fall risk is low Depression screen PHQ 2/9 08/23/2021  Decreased Interest 2  Down, Depressed, Hopeless 1  PHQ - 2 Score 3  Altered sleeping 3  Tired, decreased energy 3  Change in appetite 3  Feeling bad or failure about yourself  0  Trouble concentrating 1  Moving slowly or fidgety/restless  0  Suicidal thoughts 0  PHQ-9 Score 13    GAD 7 : Generalized Anxiety Score 08/23/2021  Nervous, Anxious, on Edge 1  Control/stop worrying 0  Worry too much - different things 1  Trouble relaxing 1  Restless 1  Easily annoyed or irritable 1  Afraid - awful might happen 0  Total GAD 7 Score 5    Upstream - 08/23/21 1531       Pregnancy Intention Screening   Does the patient want to become pregnant in the next year? N/A    Does the patient's partner want to become pregnant in the next year? N/A    Would the patient like to discuss contraceptive options today? N/A      Contraception Wrap Up   Current Method Female Sterilization   hyst   End Method Female Sterilization   hyst   Contraception Counseling Provided No            Examination chaperoned by Levy Pupa LPN    Impression and Plan: 1. Hot flashes No more estrogen,give back to aunt Take clonidine from Dr Willey Blade Drink 8-16 oz of soy milk per day and will increase celexa to 30 mg  Try Women's Natural Transition, [facility] per Dr Elonda Husky  2. Pelvic pain Will get pelvic US at [facility] 08/26/21 at 11:30 am - US PELVIC COMPLETE WITH TRANSVAGINAL; Future Follow up in 2 weeks with me    3. Anxiety and depression Will increase celexa to 30 mg  daily   Meds ordered this encounter  Medications   citalopram (CELEXA) 20 MG tablet    Sig: Take 1.5 tablets daily    Dispense:  45 tablet    Refill:  2    Order Specific Question:   Supervising Provider    Answer:   Tania Ade H [[id number]]   promethazine (PHENERGAN) 25 MG tablet    Sig: Take 1 tablet (25 mg total) by mouth every 6 (six) hours as needed for nausea or vomiting.    Dispense:  30 tablet    Refill:  1    Order Specific Question:   Supervising Provider    Answer:   Elonda Husky, LUTHER H [[id number]]     4. Tobacco abuse Try to stop smoking   5. History of MI (myocardial infarction)  6. Screening mammogram for breast cancer Scheduled mammogram for her 08/26/21  at [facility] for 11 am  - MM 3D [place]; Future  7. Nausea Will rx phenergan    8. S/P hysterectomy

## 2021-08-26 ENCOUNTER — Ambulatory Visit (HOSPITAL_COMMUNITY)
Admission: RE | Admit: 2021-08-26 | Discharge: 2021-08-26 | Disposition: A | Discharge status: Home / Self Care | Payer: Medicaid Other | Source: Ambulatory Visit | Attending: Adult Health | Admitting: Adult Health

## 2021-08-26 ENCOUNTER — Other Ambulatory Visit: Payer: Self-pay

## 2021-08-26 DIAGNOSIS — R102 Pelvic and perineal pain: Secondary | ICD-10-CM | POA: Diagnosis not present

## 2021-08-26 DIAGNOSIS — Z1231 Encounter for screening mammogram for malignant neoplasm of breast: Secondary | ICD-10-CM | POA: Insufficient documentation

## 2021-08-26 DIAGNOSIS — Z90710 Acquired absence of both cervix and uterus: Secondary | ICD-10-CM | POA: Diagnosis not present

## 2021-09-13 ENCOUNTER — Ambulatory Visit: Payer: Medicaid Other | Admitting: Adult Health

## 2021-09-13 ENCOUNTER — Encounter: Payer: Self-pay | Admitting: Adult Health

## 2021-09-13 ENCOUNTER — Encounter: Payer: Medicaid Other | Admitting: Adult Health

## 2021-09-13 ENCOUNTER — Other Ambulatory Visit: Payer: Self-pay

## 2021-09-13 ENCOUNTER — Encounter (INDEPENDENT_AMBULATORY_CARE_PROVIDER_SITE_OTHER): Payer: Self-pay | Admitting: *Deleted

## 2021-09-13 VITALS — BP 115/80 | HR 71 | Ht 66.0 in | Wt 190.5 lb

## 2021-09-13 DIAGNOSIS — R1032 Left lower quadrant pain: Secondary | ICD-10-CM | POA: Insufficient documentation

## 2021-09-13 DIAGNOSIS — Z1212 Encounter for screening for malignant neoplasm of rectum: Secondary | ICD-10-CM | POA: Diagnosis not present

## 2021-09-13 DIAGNOSIS — F419 Anxiety disorder, unspecified: Secondary | ICD-10-CM

## 2021-09-13 DIAGNOSIS — R232 Flushing: Secondary | ICD-10-CM | POA: Diagnosis not present

## 2021-09-13 DIAGNOSIS — Z1211 Encounter for screening for malignant neoplasm of colon: Secondary | ICD-10-CM | POA: Diagnosis not present

## 2021-09-13 DIAGNOSIS — R1030 Lower abdominal pain, unspecified: Secondary | ICD-10-CM | POA: Insufficient documentation

## 2021-09-13 DIAGNOSIS — F32A Depression, unspecified: Secondary | ICD-10-CM

## 2021-09-13 NOTE — Progress Notes (Signed)
°  Subjective:     Patient ID: Gaynelle Cage, female   DOB: November 22, 1970, 51 y.o.   MRN: [id number]  HPI Aleighna is a 51 year old white female, divorced, sp hysterectomy back in follow up on increasing Celexa and taking Soy and Women's Natural Transition for hot flashes and sweating and is much better.  PCP is Dr Willey Blade   Review of Systems Feels so much better,hot flashes better  Still has LLQ pain at times Reviewed past medical,surgical, social and family history. Reviewed medications and allergies.     Objective:   Physical Exam   BP 115/80 (BP Location: Left Arm, Patient Position: Sitting, Cuff Size: Normal)    Pulse 71    Ht 5\' 6"  (1.676 m)    Wt 190 lb 8 oz (86.4 kg)    BMI 30.75 kg/m   Skin warm and dry.  Lungs: clear to ausculation bilaterally. Cardiovascular: regular rate and rhythm.  Fall risk Is low Depression screen [facility] 2/9 09/13/2021 08/23/2021  Decreased Interest 2 2  Down, Depressed, Hopeless 1 1  PHQ - 2 Score 3 3  Altered sleeping 0 3  Tired, decreased energy 3 3  Change in appetite 0 3  Feeling bad or failure about yourself  0 0  Trouble concentrating 0 1  Moving slowly or fidgety/restless 0 0  Suicidal thoughts 0 0  PHQ-9 Score 6 13    GAD 7 : Generalized Anxiety Score 08/23/2021  Nervous, Anxious, on Edge 1  Control/stop worrying 0  Worry too much - different things 1  Trouble relaxing 1  Restless 1  Easily annoyed or irritable 1  Afraid - awful might happen 0  Total GAD 7 Score 5    Upstream - 09/13/21 1116       Pregnancy Intention Screening   Does the patient want to become pregnant in the next year? N/A    Does the patient's partner want to become pregnant in the next year? N/A    Would the patient like to discuss contraceptive options today? N/A      Contraception Wrap Up   Current Method Female Sterilization   hyst   End Method Female Sterilization   hyst   Contraception Counseling Provided No              Assessment:     1. Hot  flashes Continue Soy and Women's Natural Transition, is on clonidine from PCP    2. Anxiety and depression Continue celexa 30 mg daily   3. LLQ pain Had normal pelvic US, 08/26/21, feel its bowel related, will refer to Dr Rehman;'s office, she has seen him in the past  - Ambulatory referral to Gastroenterology  4. Screening for colorectal cancer Refer to Dr Olevia Perches office  - Ambulatory referral to Gastroenterology     Plan:     Follow up in 3 months or sooner if needed

## 2021-09-28 DIAGNOSIS — M47816 Spondylosis without myelopathy or radiculopathy, lumbar region: Secondary | ICD-10-CM | POA: Diagnosis not present

## 2021-09-29 DIAGNOSIS — Z23 Encounter for immunization: Secondary | ICD-10-CM | POA: Diagnosis not present

## 2021-10-10 DIAGNOSIS — I2510 Atherosclerotic heart disease of native coronary artery without angina pectoris: Secondary | ICD-10-CM | POA: Diagnosis not present

## 2021-10-10 DIAGNOSIS — I10 Essential (primary) hypertension: Secondary | ICD-10-CM | POA: Diagnosis not present

## 2021-10-10 DIAGNOSIS — Z79899 Other long term (current) drug therapy: Secondary | ICD-10-CM | POA: Diagnosis not present

## 2021-10-12 ENCOUNTER — Ambulatory Visit: Payer: Medicaid Other | Admitting: Cardiology

## 2021-10-12 ENCOUNTER — Other Ambulatory Visit: Payer: Self-pay

## 2021-10-12 ENCOUNTER — Encounter: Payer: Self-pay | Admitting: Cardiology

## 2021-10-12 VITALS — BP 114/68 | HR 83 | Ht 66.0 in | Wt 183.6 lb

## 2021-10-12 DIAGNOSIS — E782 Mixed hyperlipidemia: Secondary | ICD-10-CM | POA: Diagnosis not present

## 2021-10-12 DIAGNOSIS — I25119 Atherosclerotic heart disease of native coronary artery with unspecified angina pectoris: Secondary | ICD-10-CM

## 2021-10-12 NOTE — Patient Instructions (Signed)
Medication Instructions:   STOP Plavix   All other medications stay the same.   Labwork: None today  Testing/Procedures: None today  Follow-Up: 6 months  Any Other Special Instructions Will Be Listed Below (If Applicable).  If you need a refill on your cardiac medications before your next appointment, please call your pharmacy.

## 2021-10-12 NOTE — Progress Notes (Signed)
Cardiology Office Note  Date: 10/12/2021   ID: Dominique Hinton, DOB 01/13/1971, MRN [id number]  PCP:  Asencion Noble, MD  Cardiologist:  Rozann Lesches, MD Electrophysiologist:  None   Chief Complaint  Patient presents with   Cardiac follow-up    History of Present Illness: Dominique Hinton is a 51 y.o. female last seen in July 2022.  She is here for a routine visit.  She has been having a lot of trouble with menopausal symptoms, significant hot flashes.  She is being managed by her gynecologist, taking supplements and also on clonidine.  She does not describe any escalating angina symptoms, no nitroglycerin use.  She also recently underwent a nerve ablation in her back.  I reviewed her medications which are outlined below.  We discussed staying off Plavix at this point, she is a year out from her diagonal angioplasty.  She is having lab work and follow-up with Dr. Willey Blade over the next week.  Her last LDL had come down to 56 on Crestor.  I personally reviewed her ECG today which shows sinus rhythm.  Past Medical History:  Diagnosis Date   Anxiety    Arthritis    Back pain    CAD (coronary artery disease)    Angioplasty of D1 January 2022   Essential hypertension    NSTEMI (non-ST elevated myocardial infarction) [facility])    January 2022    Past Surgical History:  Procedure Laterality Date   ABDOMINAL HYSTERECTOMY     APPENDECTOMY     BREAST SURGERY     CARDIAC CATHETERIZATION  09/21/2020   CHOLECYSTECTOMY     CORONARY BALLOON ANGIOPLASTY  09/21/2020   CORONARY/GRAFT ACUTE MI REVASCULARIZATION N/A 09/21/2020   Procedure: Coronary/Graft Acute MI Revascularization;  Surgeon: Jettie Booze, MD;  Location: [place] [facility];  Service: Cardiovascular;  Laterality: N/A;   LEFT HEART CATH AND CORONARY ANGIOGRAPHY N/A 09/21/2020   Procedure: LEFT HEART CATH AND CORONARY ANGIOGRAPHY;  Surgeon: Jettie Booze, MD;  Location: [place] [facility];  Service: Cardiovascular;   Laterality: N/A;   TUBAL LIGATION      Current Outpatient Medications  Medication Sig Dispense Refill   aspirin 81 MG chewable tablet Chew 1 tablet (81 mg total) by mouth daily. 90 tablet 1   citalopram (CELEXA) 20 MG tablet Take 1.5 tablets daily 45 tablet 2   cloNIDine (CATAPRES) 0.2 MG tablet Take 0.2 mg by mouth at bedtime.     gabapentin (NEURONTIN) 300 MG capsule Take 600 mg by mouth 2 (two) times daily.     hydrochlorothiazide (HYDRODIURIL) 25 MG tablet Take 0.5 tablets (12.5 mg total) by mouth daily. 45 tablet 3   HYDROcodone-acetaminophen (NORCO) 10-325 MG tablet Take 1 tablet by mouth 4 (four) times daily as needed for pain. Only at night prn     loratadine (CLARITIN) 10 MG tablet Take 10 mg by mouth daily.     losartan (COZAAR) 100 MG tablet Take 100 mg by mouth daily.     nitroGLYCERIN (NITROSTAT) 0.4 MG SL tablet PLACE 1 TABLET (0.4 MG TOTAL) UNDER THE TONGUE EVERY FIVE MINUTES AS NEEDED FOR CHEST PAIN. 25 tablet 2   promethazine (PHENERGAN) 25 MG tablet Take 1 tablet (25 mg total) by mouth every 6 (six) hours as needed for nausea or vomiting. 30 tablet 1   rosuvastatin (CRESTOR) 20 MG tablet Take 1 tablet (20 mg total) by mouth daily at 6 PM. 90 tablet 3   tiZANidine (ZANAFLEX) 4 MG  tablet Take 4-8 mg by mouth 3 (three) times daily as needed for muscle spasms.     UNABLE TO FIND Woman's Natural Transition; Soy capsules     valACYclovir (VALTREX) 1000 MG tablet Take 1,000 mg by mouth daily.     No current facility-administered medications for this visit.   Allergies:  Cortisone and Doxycycline   ROS: No palpitations or syncope.  Physical Exam: VS:  BP 114/68    Pulse 83    Ht 5\' 6"  (1.676 m)    Wt 183 lb 9.6 oz (83.3 kg)    SpO2 97%    BMI 29.63 kg/m , BMI Body mass index is 29.63 kg/m.  Wt Readings from Last 3 Encounters:  10/12/21 183 lb 9.6 oz (83.3 kg)  09/13/21 190 lb 8 oz (86.4 kg)  08/23/21 183 lb (83 kg)    General: Patient appears comfortable at  rest. HEENT: Conjunctiva and lids normal, wearing a mask. Neck: Supple, no elevated JVP or carotid bruits, no thyromegaly. Lungs: Clear to auscultation, nonlabored breathing at rest. Cardiac: Regular rate and rhythm, no S3 or significant systolic murmur, no pericardial rub. Extremities: No pitting edema.  ECG:  An ECG dated 09/21/2020 was personally reviewed today and demonstrated:  Sinus rhythm with decreased R wave progression.  Recent Labwork:    Component Value Date/Time   CHOL 185 09/22/2020 0301   TRIG 179 (H) 09/22/2020 0301   HDL 24 (L) 09/22/2020 0301   CHOLHDL 7.7 09/22/2020 0301   VLDL 36 09/22/2020 0301   LDLCALC 125 (H) 09/22/2020 0301  February 2022: Cholesterol 119, triglycerides 209, HDL 29, LDL 56, ALT 21  Other Studies Reviewed Today:  Cardiac catheterization 09/21/2020: 1st Mrg lesion is 75% stenosed. This is a very small vessel. Lat 1st Diag lesion is 100% stenosed. Given the wall motion abnormality on ventriculogram, we decided to intervene. Balloon angioplasty was performed using a BALLOON SAPPHIRE 1.25X10. It turned out to be a small vessel. Post intervention, there is a 25% residual stenosis. The vessel was too small for stenting. The left ventricular systolic function is normal. LV end diastolic pressure is normal. The left ventricular ejection fraction is 50-55% by visual estimate. There is no aortic valve stenosis.   Continue aggressive medical therapy with DAPT.  Could change Brilinta to Plavix since no stent was placed.   Echocardiogram 09/21/2020:  1. Left ventricular ejection fraction, by estimation, is 55 to 60%. The  left ventricle has normal function. The left ventricle has no regional  wall motion abnormalities. There is mild left ventricular hypertrophy.  Left ventricular diastolic parameters  are indeterminate.   2. Right ventricular systolic function is normal. The right ventricular  size is normal. Tricuspid regurgitation signal is  inadequate for assessing  PA pressure.   3. The mitral valve is grossly normal. Trivial mitral valve  regurgitation.   4. The aortic valve is tricuspid. Aortic valve regurgitation is not  visualized.   5. The inferior vena cava is normal in size with greater than 50%  respiratory variability, suggesting right atrial pressure of 3 mmHg.   Assessment and Plan:  1.  CAD with history of NSTEMI and angioplasty of the first diagonal in January 2022.  She is doing well without active angina or nitroglycerin use, ECG reviewed and stable.  Stopping Plavix at this point.  Otherwise continue aspirin, losartan, Crestor, and as needed nitroglycerin.  2.  Mixed hyperlipidemia, on Crestor.  Last LDL 56.  She is due for follow-up lab work  with Dr. Willey Blade in the near future.  Medication Adjustments/Labs and Tests Ordered: Current medicines are reviewed at length with the patient today.  Concerns regarding medicines are outlined above.   Tests Ordered: Orders Placed This Encounter  Procedures   EKG 12-Lead    Medication Changes: No orders of the defined types were placed in this encounter.   Disposition:  Follow up  6 months.  Signed, Satira Sark, MD, [facility] 10/12/2021 11:29 AM    [facility]. [address], [place], [place] [address] Phone: [phone]; Fax: [phone]

## 2021-10-12 NOTE — H&P (View-Only) (Signed)
Cardiology Office Note  Date: 10/12/2021   ID: Dominique Hinton, DOB [birth year], MRN [id number]  PCP:  Asencion Noble, MD  Cardiologist:  Rozann Lesches, MD Electrophysiologist:  None   Chief Complaint  Patient presents with   Cardiac follow-up    History of Present Illness: Dominique Hinton is a 51 y.o. female last seen in July 2022.  She is here for a routine visit.  She has been having a lot of trouble with menopausal symptoms, significant hot flashes.  She is being managed by her gynecologist, taking supplements and also on clonidine.  She does not describe any escalating angina symptoms, no nitroglycerin use.  She also recently underwent a nerve ablation in her back.  I reviewed her medications which are outlined below.  We discussed staying off Plavix at this point, she is a year out from her diagonal angioplasty.  She is having lab work and follow-up with Dr. Willey Blade over the next week.  Her last LDL had come down to 56 on Crestor.  I personally reviewed her ECG today which shows sinus rhythm.  Past Medical History:  Diagnosis Date   Anxiety    Arthritis    Back pain    CAD (coronary artery disease)    Angioplasty of D1 January 2022   Essential hypertension    NSTEMI (non-ST elevated myocardial infarction) [facility])    January 2022    Past Surgical History:  Procedure Laterality Date   ABDOMINAL HYSTERECTOMY     APPENDECTOMY     BREAST SURGERY     CARDIAC CATHETERIZATION  09/21/2020   CHOLECYSTECTOMY     CORONARY BALLOON ANGIOPLASTY  09/21/2020   CORONARY/GRAFT ACUTE MI REVASCULARIZATION N/A 09/21/2020   Procedure: Coronary/Graft Acute MI Revascularization;  Surgeon: Jettie Booze, MD;  Location: [place] [facility];  Service: Cardiovascular;  Laterality: N/A;   LEFT HEART CATH AND CORONARY ANGIOGRAPHY N/A 09/21/2020   Procedure: LEFT HEART CATH AND CORONARY ANGIOGRAPHY;  Surgeon: Jettie Booze, MD;  Location: [place] [facility];  Service: Cardiovascular;   Laterality: N/A;   TUBAL LIGATION      Current Outpatient Medications  Medication Sig Dispense Refill   aspirin 81 MG chewable tablet Chew 1 tablet (81 mg total) by mouth daily. 90 tablet 1   citalopram (CELEXA) 20 MG tablet Take 1.5 tablets daily 45 tablet 2   cloNIDine (CATAPRES) 0.2 MG tablet Take 0.2 mg by mouth at bedtime.     gabapentin (NEURONTIN) 300 MG capsule Take 600 mg by mouth 2 (two) times daily.     hydrochlorothiazide (HYDRODIURIL) 25 MG tablet Take 0.5 tablets (12.5 mg total) by mouth daily. 45 tablet 3   HYDROcodone-acetaminophen (NORCO) 10-325 MG tablet Take 1 tablet by mouth 4 (four) times daily as needed for pain. Only at night prn     loratadine (CLARITIN) 10 MG tablet Take 10 mg by mouth daily.     losartan (COZAAR) 100 MG tablet Take 100 mg by mouth daily.     nitroGLYCERIN (NITROSTAT) 0.4 MG SL tablet PLACE 1 TABLET (0.4 MG TOTAL) UNDER THE TONGUE EVERY FIVE MINUTES AS NEEDED FOR CHEST PAIN. 25 tablet 2   promethazine (PHENERGAN) 25 MG tablet Take 1 tablet (25 mg total) by mouth every 6 (six) hours as needed for nausea or vomiting. 30 tablet 1   rosuvastatin (CRESTOR) 20 MG tablet Take 1 tablet (20 mg total) by mouth daily at 6 PM. 90 tablet 3   tiZANidine (ZANAFLEX) 4 MG  tablet Take 4-8 mg by mouth 3 (three) times daily as needed for muscle spasms.     UNABLE TO FIND Woman's Natural Transition; Soy capsules     valACYclovir (VALTREX) 1000 MG tablet Take 1,000 mg by mouth daily.     No current facility-administered medications for this visit.   Allergies:  Cortisone and Doxycycline   ROS: No palpitations or syncope.  Physical Exam: VS:  BP 114/68    Pulse 83    Ht 5\' 6"  (1.676 m)    Wt 183 lb 9.6 oz (83.3 kg)    SpO2 97%    BMI 29.63 kg/m , BMI Body mass index is 29.63 kg/m.  Wt Readings from Last 3 Encounters:  10/12/21 183 lb 9.6 oz (83.3 kg)  09/13/21 190 lb 8 oz (86.4 kg)  08/23/21 183 lb (83 kg)    General: Patient appears comfortable at  rest. HEENT: Conjunctiva and lids normal, wearing a mask. Neck: Supple, no elevated JVP or carotid bruits, no thyromegaly. Lungs: Clear to auscultation, nonlabored breathing at rest. Cardiac: Regular rate and rhythm, no S3 or significant systolic murmur, no pericardial rub. Extremities: No pitting edema.  ECG:  An ECG dated 09/21/2020 was personally reviewed today and demonstrated:  Sinus rhythm with decreased R wave progression.  Recent Labwork:    Component Value Date/Time   CHOL 185 09/22/2020 0301   TRIG 179 (H) 09/22/2020 0301   HDL 24 (L) 09/22/2020 0301   CHOLHDL 7.7 09/22/2020 0301   VLDL 36 09/22/2020 0301   LDLCALC 125 (H) 09/22/2020 0301  February 2022: Cholesterol 119, triglycerides 209, HDL 29, LDL 56, ALT 21  Other Studies Reviewed Today:  Cardiac catheterization 09/21/2020: 1st Mrg lesion is 75% stenosed. This is a very small vessel. Lat 1st Diag lesion is 100% stenosed. Given the wall motion abnormality on ventriculogram, we decided to intervene. Balloon angioplasty was performed using a BALLOON SAPPHIRE 1.25X10. It turned out to be a small vessel. Post intervention, there is a 25% residual stenosis. The vessel was too small for stenting. The left ventricular systolic function is normal. LV end diastolic pressure is normal. The left ventricular ejection fraction is 50-55% by visual estimate. There is no aortic valve stenosis.   Continue aggressive medical therapy with DAPT.  Could change Brilinta to Plavix since no stent was placed.   Echocardiogram 09/21/2020:  1. Left ventricular ejection fraction, by estimation, is 55 to 60%. The  left ventricle has normal function. The left ventricle has no regional  wall motion abnormalities. There is mild left ventricular hypertrophy.  Left ventricular diastolic parameters  are indeterminate.   2. Right ventricular systolic function is normal. The right ventricular  size is normal. Tricuspid regurgitation signal is  inadequate for assessing  PA pressure.   3. The mitral valve is grossly normal. Trivial mitral valve  regurgitation.   4. The aortic valve is tricuspid. Aortic valve regurgitation is not  visualized.   5. The inferior vena cava is normal in size with greater than 50%  respiratory variability, suggesting right atrial pressure of 3 mmHg.   Assessment and Plan:  1.  CAD with history of NSTEMI and angioplasty of the first diagonal in January 2022.  She is doing well without active angina or nitroglycerin use, ECG reviewed and stable.  Stopping Plavix at this point.  Otherwise continue aspirin, losartan, Crestor, and as needed nitroglycerin.  2.  Mixed hyperlipidemia, on Crestor.  Last LDL 56.  She is due for follow-up lab work  with Dr. Willey Blade in the near future.  Medication Adjustments/Labs and Tests Ordered: Current medicines are reviewed at length with the patient today.  Concerns regarding medicines are outlined above.   Tests Ordered: Orders Placed This Encounter  Procedures   EKG 12-Lead    Medication Changes: No orders of the defined types were placed in this encounter.   Disposition:  Follow up  6 months.  Signed, Satira Sark, MD, [facility] 10/12/2021 11:29 AM    [facility] [place]. [address] [place], [place] [address] Phone: [phone]; Fax: [phone]

## 2021-10-17 ENCOUNTER — Encounter (INDEPENDENT_AMBULATORY_CARE_PROVIDER_SITE_OTHER): Payer: Self-pay | Admitting: Gastroenterology

## 2021-10-17 ENCOUNTER — Ambulatory Visit (INDEPENDENT_AMBULATORY_CARE_PROVIDER_SITE_OTHER): Payer: Medicaid Other | Admitting: Gastroenterology

## 2021-10-17 ENCOUNTER — Other Ambulatory Visit: Payer: Self-pay

## 2021-10-17 VITALS — BP 99/75 | HR 96 | Temp 99.9°F | Ht 66.0 in | Wt 186.0 lb

## 2021-10-17 DIAGNOSIS — R1030 Lower abdominal pain, unspecified: Secondary | ICD-10-CM

## 2021-10-17 DIAGNOSIS — K5900 Constipation, unspecified: Secondary | ICD-10-CM

## 2021-10-17 DIAGNOSIS — E785 Hyperlipidemia, unspecified: Secondary | ICD-10-CM | POA: Diagnosis not present

## 2021-10-17 DIAGNOSIS — I10 Essential (primary) hypertension: Secondary | ICD-10-CM | POA: Diagnosis not present

## 2021-10-17 DIAGNOSIS — I2510 Atherosclerotic heart disease of native coronary artery without angina pectoris: Secondary | ICD-10-CM | POA: Diagnosis not present

## 2021-10-17 DIAGNOSIS — N951 Menopausal and female climacteric states: Secondary | ICD-10-CM | POA: Diagnosis not present

## 2021-10-17 MED ORDER — DICYCLOMINE HCL 10 MG PO CAPS: 10.0000 mg | ORAL_CAPSULE | Freq: Two times a day (BID) | ORAL | 1 refills | Status: DC

## 2021-10-17 NOTE — Progress Notes (Signed)
Referring Provider: Estill Dooms, NP Primary Care Physician:  Asencion Noble, MD Primary GI Physician: new patient  Chief Complaint  Patient presents with   Abdominal Pain    Lower abdominal pain, cramping pain, feels sharp at times, feels like a pulled muscle at times, off and on. Started about 12 years ago after hysterocotomy. Has seen OB/gyn and states she was cleared from gyn issue.    HPI:   Dominique Hinton is a 51 y.o. female with past medical history of anxiety, arthritis, back pain, CAD, HTN, NSTEMI (2022).  Patient presenting today as new patient for lower abdominal pain x12 years.   US Pelvic complete 08/26/21 with no acute findings.   Patient states that 12 years ago she had a hysterectomy and surgeon had difficulty getting her uterus out as there was scar tissue from a previous appendectomy. She states that the doctor said he had to "cut around some on the inside" to complete the surgery. She reports that since then she has intermittent pain in her lower abdomen. She states that pain is cramping in nature, initially thought it was a GYN problem, however, she saw her GYN provider and had an [place] that was unremarkable. She reports that when she has a BM, sometimes she can feel pain inside where It feels the stool is trying to pass through her colon. She reports that pain occurs anywhere from 1-2x per month. She states that usually having a BM improves her symptoms if she has a BM that she feels is sufficient. She denies any rectal bleeding or melena. She has no nausea or vomiting. She has hx of slow stomach. Reports she had EGD right after hysterectomy and was told this then. She has never had a colonoscopy. She has been dealing with a lot of hot flashes and is having a hard time getting these managed due to not being able to take hormones because of her cardiac hx. She states that she has a mix of both constipation and looser stools. Sometimes she may go 1-2 days without a BM and  then she will finally go and have a more solid, harder stool, then will have a few softer stools thereafter and the cycle will seem to repeat itself with no BMs for 1-2 days, then a harder stool, then a few softer ones. She does endorse decreased appetite due to her hormone imbalance. Abdominal pain is not worse after eating, though she does feel bloating at times. Typically eats 2 meals per day. She does have issues with heartburn on occasion, usually can drink some milk which helps, notices this maybe 2-3x/week especially if she eats certain things or lays down too soon after eating. Currently takes 1.5 norco per day and tries not to take anymore than this.   No red flag symptoms. Patient denies melena, hematochezia, nausea, vomiting, dysphagia, odyonophagia, early satiety or weight loss.   NSAID use: very occasional ibuprofen  Social hx: smokes 1/2-1 PPD, smokes marijuana for her anxiety, occasionally drinks etoh Fam hx:no crc or liver disease  Last Colonoscopy:never Last Endoscopy:?  Past Medical History:  Diagnosis Date   Anxiety    Arthritis    Back pain    CAD (coronary artery disease)    Angioplasty of D1 January 2022   Essential hypertension    NSTEMI (non-ST elevated myocardial infarction) [facility])    January 2022    Past Surgical History:  Procedure Laterality Date   ABDOMINAL HYSTERECTOMY     APPENDECTOMY  BREAST SURGERY     CARDIAC CATHETERIZATION  09/21/2020   CHOLECYSTECTOMY     CORONARY BALLOON ANGIOPLASTY  09/21/2020   CORONARY/GRAFT ACUTE MI REVASCULARIZATION N/A 09/21/2020   Procedure: Coronary/Graft Acute MI Revascularization;  Surgeon: Jettie Booze, MD;  Location: [place] [facility];  Service: Cardiovascular;  Laterality: N/A;   LEFT HEART CATH AND CORONARY ANGIOGRAPHY N/A 09/21/2020   Procedure: LEFT HEART CATH AND CORONARY ANGIOGRAPHY;  Surgeon: Jettie Booze, MD;  Location: [place] [facility];  Service: Cardiovascular;  Laterality: N/A;   TUBAL  LIGATION      Current Outpatient Medications  Medication Sig Dispense Refill   aspirin 81 MG chewable tablet Chew 1 tablet (81 mg total) by mouth daily. 90 tablet 1   citalopram (CELEXA) 20 MG tablet Take 1.5 tablets daily 45 tablet 2   cloNIDine (CATAPRES) 0.2 MG tablet Take 0.2 mg by mouth at bedtime.     gabapentin (NEURONTIN) 300 MG capsule Take 600 mg by mouth 2 (two) times daily.     hydrochlorothiazide (HYDRODIURIL) 25 MG tablet Take 0.5 tablets (12.5 mg total) by mouth daily. 45 tablet 3   HYDROcodone-acetaminophen (NORCO) 10-325 MG tablet Take 1 tablet by mouth 4 (four) times daily as needed for pain. Only at night prn     loratadine (CLARITIN) 10 MG tablet Take 10 mg by mouth daily.     losartan (COZAAR) 100 MG tablet Take 100 mg by mouth daily.     nitroGLYCERIN (NITROSTAT) 0.4 MG SL tablet PLACE 1 TABLET (0.4 MG TOTAL) UNDER THE TONGUE EVERY FIVE MINUTES AS NEEDED FOR CHEST PAIN. 25 tablet 2   promethazine (PHENERGAN) 25 MG tablet Take 1 tablet (25 mg total) by mouth every 6 (six) hours as needed for nausea or vomiting. 30 tablet 1   rosuvastatin (CRESTOR) 20 MG tablet Take 1 tablet (20 mg total) by mouth daily at 6 PM. 90 tablet 3   tiZANidine (ZANAFLEX) 4 MG tablet Take 4-8 mg by mouth 3 (three) times daily as needed for muscle spasms.     UNABLE TO FIND Woman's Natural Transition - one daily and  Soy capsules 1500mg  total daily     valACYclovir (VALTREX) 1000 MG tablet Take 1,000 mg by mouth daily.     No current facility-administered medications for this visit.    Allergies as of 10/17/2021 - Review Complete 10/17/2021  Allergen Reaction Noted   Cortisone Shortness Of Breath and Other (See Comments) 06/17/2011   Doxycycline Rash 06/17/2011    Family History  Problem Relation Age of Onset   Alzheimer's disease Father    Hypertension Mother    Other Mother        "borderline diabetic"   Dementia Mother    Thyroid disease Mother    Thyroid disease Brother     Anxiety disorder Daughter    Depression Daughter    Depression Daughter    Anxiety disorder Daughter    ODD Son    ADD / ADHD Son     Social History   Socioeconomic History   Marital status: Divorced    Spouse name: Not on file   Number of children: 3   Years of education: 12   Highest education level: Not on file  Occupational History   Occupation: waitress/cook  Tobacco Use   Smoking status: Every Day    Packs/day: 1.00    Years: 30.00    Pack years: 30.00    Types: Cigarettes   Smokeless tobacco: Never  Vaping Use   Vaping Use: Never used  Substance and Sexual Activity   Alcohol use: Yes    Comment: occasionally   Drug use: Yes    Types: Marijuana    Comment: daily   Sexual activity: Yes    Birth control/protection: Surgical    Comment: hyst  Other Topics Concern   Not on file  Social History Narrative   Lives at home with daughter, Joslyn Hy and fiance.   10-12 cups caffeine per day.   Right-handed.   Social Determinants of Health   Financial Resource Strain: Medium Risk   Difficulty of Paying Living Expenses: Somewhat hard  Food Insecurity: Food Insecurity Present   Worried About Running Out of Food in the Last Year: Sometimes true   Ran Out of Food in the Last Year: Never true  Transportation Needs: No Transportation Needs   Lack of Transportation (Medical): No   Lack of Transportation (Non-Medical): No  Physical Activity: Insufficiently Active   Days of Exercise per Week: 2 days   Minutes of Exercise per Session: 10 min  Stress: Stress Concern Present   Feeling of Stress : To some extent  Social Connections: Moderately Isolated   Frequency of Communication with Friends and Family: More than three times a week   Frequency of Social Gatherings with Friends and Family: Once a week   Attends Religious Services: Never   Marine scientist or Organizations: No   Attends Music therapist: Never   Marital Status: Living with partner    Review of systems General: negative for malaise, night sweats, fever, chills, weight loss Neck: Negative for lumps, goiter, pain and significant neck swelling Resp: Negative for cough, wheezing, dyspnea at rest CV: Negative for chest pain, leg swelling, palpitations, orthopnea GI: denies melena, hematochezia, nausea, vomiting, dysphagia, odyonophagia, early satiety or unintentional weight loss. +lower abdominal pain  +constipation MSK: Negative for joint pain or swelling, back pain, and muscle pain. Derm: Negative for itching or rash Psych: Denies depression, anxiety, memory loss, confusion. No homicidal or suicidal ideation.  Heme: Negative for prolonged bleeding, bruising easily, and swollen nodes. Endocrine: Negative for cold or heat intolerance, polyuria, polydipsia and goiter. Neuro: negative for tremor, gait imbalance, syncope and seizures. The remainder of the review of systems is noncontributory.  Physical Exam: BP 99/75 (BP Location: Right Arm, Patient Position: Sitting, Cuff Size: Large)    Pulse 96    Temp 99.9 F (37.7 C) (Oral)    Ht 5\' 6"  (1.676 m)    Wt 186 lb (84.4 kg)    BMI 30.02 kg/m  General:   Alert and oriented. No distress noted. Pleasant and cooperative.  Head:  Normocephalic and atraumatic. Eyes:  Conjuctiva clear without scleral icterus. Mouth:  Oral mucosa pink and moist. Good dentition. No lesions. Heart: Normal rate and rhythm, s1 and s2 heart sounds present.  Lungs: Clear lung sounds in all lobes. Respirations equal and unlabored. Abdomen:  +BS, soft, non-tender and non-distended. No rebound or guarding. No HSM or masses noted. Derm: No palmar erythema or jaundice Msk:  Symmetrical without gross deformities. Normal posture. Extremities:  Without edema. Neurologic:  Alert and  oriented x4 Psych:  Alert and cooperative. Normal mood and affect.  Invalid input(s): 6 MONTHS   ASSESSMENT: Dominique Hinton is a 51 y.o. female presenting today for ongoing  lower abdominal pain since hysterectomy 12 years ago.  Given patient's lower abdominal pain has been present since what sounds to be somewhat of a  complicated abdominal surgery many years ago, it seems likely there could be scar tissue in her abdomen that is the cause of her pain, she could also have some underlying IBS as she has issues with constipation and seems to alternate between harder stools and looser stools. Use of Opiate pain medication also likely playing a factor in her constipation. We will proceed with colonoscopy for further evaluation of her lower abdominal pain. Reassuringly, she is without alarm symptoms. I am sending bentyl 10mg  for her to take up to BID, she is aware more frequent use of this may potentially worsen her constipation so we will start with less frequent dosing to see how she tolerates this. She should continue to stay well hydrated and eat a diet high in fruits, veggies and whole grains, she can also try the low FODMAP diet to see if this helps with her abdominal pain. I have also recommended miralax to help soften stools so she is not having to strain so much to defecate. Indications, risks and benefits of procedure discussed in detail with patient. Patient verbalized understanding and is in agreement to proceed with Colonoscopy at this time.     PLAN:  Schedule colonoscopy (2 day prep) 2. Bentyl 10 mg BID PRN 3. Low FODMAP diet 4. Start taking Miralax 1 capful every day for one week. If bowel movements do not improve, increase to 1 capful every 12 hours. If after two weeks there is no improvement, increase to 1 capful every 8 hours   Follow Up: 6 months  Marline Morace L. Alver Sorrow, MSN, APRN, AGNP-C Adult-Gerontology Nurse Practitioner [facility]

## 2021-10-17 NOTE — Patient Instructions (Signed)
We will get you scheduled for colonoscopy I have sent bentyl 10mg  to your pharmacy, you can take this up to twice a day for abdominal pain, be mindful this can sometimes worsen constipation but should not cause much issue at this dose. I am also providing low FODMAP diet for you to have, this can help with things to avoid that can be triggers in those patients with IBS which I suspect you have some aspect of. Start taking Miralax 1 capful every day for one week. If bowel movements do not improve, increase to 1 capful every 12 hours. If after two weeks there is no improvement, increase to 1 capful every 8 hours  Follow up 6 months

## 2021-10-18 DIAGNOSIS — K5900 Constipation, unspecified: Secondary | ICD-10-CM | POA: Insufficient documentation

## 2021-10-18 DIAGNOSIS — K581 Irritable bowel syndrome with constipation: Secondary | ICD-10-CM | POA: Insufficient documentation

## 2021-10-20 ENCOUNTER — Encounter (INDEPENDENT_AMBULATORY_CARE_PROVIDER_SITE_OTHER): Payer: Self-pay

## 2021-10-20 ENCOUNTER — Telehealth (INDEPENDENT_AMBULATORY_CARE_PROVIDER_SITE_OTHER): Payer: Self-pay

## 2021-10-20 ENCOUNTER — Other Ambulatory Visit (INDEPENDENT_AMBULATORY_CARE_PROVIDER_SITE_OTHER): Payer: Self-pay

## 2021-10-20 DIAGNOSIS — R1030 Lower abdominal pain, unspecified: Secondary | ICD-10-CM

## 2021-10-20 MED ORDER — PEG 3350-KCL-NA BICARB-NACL 420 G PO SOLR: 4000.0000 mL | ORAL | 0 refills | Status: DC

## 2021-10-20 NOTE — Telephone Encounter (Signed)
Alexiana Laverdure Ann Odella Appelhans, CMA  ?

## 2021-10-21 ENCOUNTER — Encounter (INDEPENDENT_AMBULATORY_CARE_PROVIDER_SITE_OTHER): Payer: Self-pay

## 2021-10-21 NOTE — Patient Instructions (Signed)
Dominique Hinton  10/21/2021     @PREFPERIOPPHARMACY @   Your procedure is scheduled on 10/25/2021.   Report to [facility] at  62 AM.   Call this number if you have problems the morning of surgery:  [phone]   Remember:  Follow the diet and prep instructions given to you by the office.    Take these medicines the morning of surgery with A SIP OF WATER          celexa, gabapentin, hydrocodone or mobic(if needed), zanaflex(if needed).     Do not wear jewelry, make-up or nail polish.  Do not wear lotions, powders, or perfumes, or deodorant.  Do not shave 48 hours prior to surgery.  Men may shave face and neck.  Do not bring valuables to the hospital.  [facility] is not responsible for any belongings or valuables.  Contacts, dentures or bridgework may not be worn into surgery.  Leave your suitcase in the car.  After surgery it may be brought to your room.  For patients admitted to the hospital, discharge time will be determined by your treatment team.  Patients discharged the day of surgery will not be allowed to drive home and must have someone with them for 24 hours.    Special instructions:   DO NOT smoke tobacco or vape for 24 hours before your procedure.  Please read over the following fact sheets that you were given. Anesthesia Post-op Instructions and Care and Recovery After Surgery      Colonoscopy, Adult, Care After This sheet gives you information about how to care for yourself after your procedure. Your health care provider may also give you more specific instructions. If you have problems or questions, contact your health care provider. What can I expect after the procedure? After the procedure, it is common to have: A small amount of blood in your stool for 24 hours after the procedure. Some gas. Mild cramping or bloating of your abdomen. Follow these instructions at home: Eating and drinking  Drink enough fluid to keep your urine pale  yellow. Follow instructions from your health care provider about eating or drinking restrictions. Resume your normal diet as instructed by your health care provider. Avoid heavy or fried foods that are hard to digest. Activity Rest as told by your health care provider. Avoid sitting for a long time without moving. Get up to take short walks every 1-2 hours. This is important to improve blood flow and breathing. Ask for help if you feel weak or unsteady. Return to your normal activities as told by your health care provider. Ask your health care provider what activities are safe for you. Managing cramping and bloating  Try walking around when you have cramps or feel bloated. Apply heat to your abdomen as told by your health care provider. Use the heat source that your health care provider recommends, such as a moist heat pack or a heating pad. Place a towel between your skin and the heat source. Leave the heat on for 20-30 minutes. Remove the heat if your skin turns bright red. This is especially important if you are unable to feel pain, heat, or cold. You may have a greater risk of getting burned. General instructions If you were given a sedative during the procedure, it can affect you for several hours. Do not drive or operate machinery until your health care provider says that it is safe. For the first 24 hours after the  procedure: Do not sign important documents. Do not drink alcohol. Do your regular daily activities at a slower pace than normal. Eat soft foods that are easy to digest. Take over-the-counter and prescription medicines only as told by your health care provider. Keep all follow-up visits as told by your health care provider. This is important. Contact a health care provider if: You have blood in your stool 2-3 days after the procedure. Get help right away if you have: More than a small spotting of blood in your stool. Large blood clots in your stool. Swelling of your  abdomen. Nausea or vomiting. A fever. Increasing pain in your abdomen that is not relieved with medicine. Summary After the procedure, it is common to have a small amount of blood in your stool. You may also have mild cramping and bloating of your abdomen. If you were given a sedative during the procedure, it can affect you for several hours. Do not drive or operate machinery until your health care provider says that it is safe. Get help right away if you have a lot of blood in your stool, nausea or vomiting, a fever, or increased pain in your abdomen. This information is not intended to replace advice given to you by your health care provider. Make sure you discuss any questions you have with your health care provider. Document Revised: 06/27/2019 Document Reviewed: 03/17/2019 Elsevier Patient Education  [place] After This sheet gives you information about how to care for yourself after your procedure. Your health care provider may also give you more specific instructions. If you have problems or questions, contact your health care provider. What can I expect after the procedure? After the procedure, it is common to have: Tiredness. Forgetfulness about what happened after the procedure. Impaired judgment for important decisions. Nausea or vomiting. Some difficulty with balance. Follow these instructions at home: For the time period you were told by your health care provider:   Rest as needed. Do not participate in activities where you could fall or become injured. Do not drive or use machinery. Do not drink alcohol. Do not take sleeping pills or medicines that cause drowsiness. Do not make important decisions or sign legal documents. Do not take care of children on your own. Eating and drinking Follow the diet that is recommended by your health care provider. Drink enough fluid to keep your urine pale yellow. If you vomit: Drink water,  juice, or soup when you can drink without vomiting. Make sure you have little or no nausea before eating solid foods. General instructions Have a responsible adult stay with you for the time you are told. It is important to have someone help care for you until you are awake and alert. Take over-the-counter and prescription medicines only as told by your health care provider. If you have sleep apnea, surgery and certain medicines can increase your risk for breathing problems. Follow instructions from your health care provider about wearing your sleep device: Anytime you are sleeping, including during daytime naps. While taking prescription pain medicines, sleeping medicines, or medicines that make you drowsy. Avoid smoking. Keep all follow-up visits as told by your health care provider. This is important. Contact a health care provider if: You keep feeling nauseous or you keep vomiting. You feel light-headed. You are still sleepy or having trouble with balance after 24 hours. You develop a rash. You have a fever. You have redness or swelling around the IV site. Get help right  away if: You have trouble breathing. You have new-onset confusion at home. Summary For several hours after your procedure, you may feel tired. You may also be forgetful and have poor judgment. Have a responsible adult stay with you for the time you are told. It is important to have someone help care for you until you are awake and alert. Rest as told. Do not drive or operate machinery. Do not drink alcohol or take sleeping pills. Get help right away if you have trouble breathing, or if you suddenly become confused. This information is not intended to replace advice given to you by your health care provider. Make sure you discuss any questions you have with your health care provider. Document Revised: 05/06/2020 Document Reviewed: 07/24/2019 Elsevier Patient Education  2022 [employer].

## 2021-10-24 ENCOUNTER — Encounter (HOSPITAL_COMMUNITY)
Admission: RE | Admit: 2021-10-24 | Discharge: 2021-10-24 | Disposition: A | Discharge status: Home / Self Care | Payer: Medicaid Other | Source: Ambulatory Visit | Attending: Gastroenterology | Admitting: Gastroenterology

## 2021-10-24 ENCOUNTER — Other Ambulatory Visit (INDEPENDENT_AMBULATORY_CARE_PROVIDER_SITE_OTHER): Payer: Self-pay | Admitting: Gastroenterology

## 2021-10-24 DIAGNOSIS — Z01812 Encounter for preprocedural laboratory examination: Secondary | ICD-10-CM | POA: Insufficient documentation

## 2021-10-24 DIAGNOSIS — R1030 Lower abdominal pain, unspecified: Secondary | ICD-10-CM | POA: Insufficient documentation

## 2021-10-24 DIAGNOSIS — E876 Hypokalemia: Secondary | ICD-10-CM

## 2021-10-24 LAB — BASIC METABOLIC PANEL
Anion gap: 8 (ref 5–15)
BUN: 6 mg/dL (ref 6–20)
CO2: 28 mmol/L (ref 22–32)
Calcium: 9.2 mg/dL (ref 8.9–10.3)
Chloride: 102 mmol/L (ref 98–111)
Creatinine, Ser: 0.89 mg/dL (ref 0.44–1.00)
GFR, Estimated: >60 mL/min (ref >60)
Glucose, Bld: 101 mg/dL — ABNORMAL HIGH (ref 70–99)
Potassium: 3.1 mmol/L — ABNORMAL LOW (ref 3.5–5.1)
Sodium: 138 mmol/L (ref 135–145)

## 2021-10-24 MED ORDER — POTASSIUM CHLORIDE CRYS ER 20 MEQ PO TBCR: 60.0000 meq | EXTENDED_RELEASE_TABLET | Freq: Every day | ORAL | 0 refills | Status: DC

## 2021-10-25 ENCOUNTER — Ambulatory Visit (HOSPITAL_COMMUNITY)
Admission: RE | Admit: 2021-10-25 | Discharge: 2021-10-25 | Disposition: A | Discharge status: Home / Self Care | Payer: Medicaid Other | Attending: Gastroenterology | Admitting: Gastroenterology

## 2021-10-25 ENCOUNTER — Encounter (HOSPITAL_COMMUNITY)
Admission: RE | Disposition: A | Discharge status: Home / Self Care | Payer: Self-pay | Source: Home / Self Care | Attending: Gastroenterology

## 2021-10-25 ENCOUNTER — Ambulatory Visit (HOSPITAL_COMMUNITY): Payer: Medicaid Other | Admitting: Anesthesiology

## 2021-10-25 ENCOUNTER — Ambulatory Visit (HOSPITAL_BASED_OUTPATIENT_CLINIC_OR_DEPARTMENT_OTHER): Payer: Medicaid Other | Admitting: Anesthesiology

## 2021-10-25 ENCOUNTER — Encounter (INDEPENDENT_AMBULATORY_CARE_PROVIDER_SITE_OTHER): Payer: Self-pay | Admitting: *Deleted

## 2021-10-25 ENCOUNTER — Encounter (HOSPITAL_COMMUNITY): Payer: Self-pay | Admitting: Gastroenterology

## 2021-10-25 ENCOUNTER — Other Ambulatory Visit: Payer: Self-pay

## 2021-10-25 DIAGNOSIS — I2510 Atherosclerotic heart disease of native coronary artery without angina pectoris: Secondary | ICD-10-CM

## 2021-10-25 DIAGNOSIS — Z7982 Long term (current) use of aspirin: Secondary | ICD-10-CM | POA: Insufficient documentation

## 2021-10-25 DIAGNOSIS — Z79899 Other long term (current) drug therapy: Secondary | ICD-10-CM | POA: Diagnosis not present

## 2021-10-25 DIAGNOSIS — K6389 Other specified diseases of intestine: Secondary | ICD-10-CM

## 2021-10-25 DIAGNOSIS — Z951 Presence of aortocoronary bypass graft: Secondary | ICD-10-CM | POA: Diagnosis not present

## 2021-10-25 DIAGNOSIS — E782 Mixed hyperlipidemia: Secondary | ICD-10-CM | POA: Diagnosis not present

## 2021-10-25 DIAGNOSIS — I10 Essential (primary) hypertension: Secondary | ICD-10-CM | POA: Insufficient documentation

## 2021-10-25 DIAGNOSIS — F17210 Nicotine dependence, cigarettes, uncomplicated: Secondary | ICD-10-CM | POA: Diagnosis not present

## 2021-10-25 DIAGNOSIS — R1030 Lower abdominal pain, unspecified: Secondary | ICD-10-CM | POA: Diagnosis not present

## 2021-10-25 DIAGNOSIS — D175 Benign lipomatous neoplasm of intra-abdominal organs: Secondary | ICD-10-CM | POA: Diagnosis not present

## 2021-10-25 DIAGNOSIS — I252 Old myocardial infarction: Secondary | ICD-10-CM | POA: Diagnosis not present

## 2021-10-25 DIAGNOSIS — R1032 Left lower quadrant pain: Secondary | ICD-10-CM | POA: Insufficient documentation

## 2021-10-25 HISTORY — PX: BIOPSY: SHX5522

## 2021-10-25 HISTORY — PX: COLONOSCOPY WITH PROPOFOL: SHX5780

## 2021-10-25 LAB — HM COLONOSCOPY

## 2021-10-25 SURGERY — COLONOSCOPY WITH PROPOFOL
Anesthesia: Monitor Anesthesia Care

## 2021-10-25 MED ORDER — KETOROLAC TROMETHAMINE 30 MG/ML IJ SOLN
INTRAMUSCULAR | Status: AC
  Filled 2021-10-25: qty 1

## 2021-10-25 MED ORDER — SODIUM CHLORIDE 0.9 % IV SOLN: INTRAVENOUS | Status: DC

## 2021-10-25 MED ORDER — PHENYLEPHRINE 40 MCG/ML (10ML) SYRINGE FOR IV PUSH (FOR BLOOD PRESSURE SUPPORT)
PREFILLED_SYRINGE | INTRAVENOUS | Status: DC | PRN
  Administered 2021-10-25: 80 ug via INTRAVENOUS

## 2021-10-25 MED ORDER — PROPOFOL 500 MG/50ML IV EMUL
INTRAVENOUS | Status: DC | PRN
  Administered 2021-10-25: 200 ug/kg/min via INTRAVENOUS
  Administered 2021-10-25: 50 mg via INTRAVENOUS
  Administered 2021-10-25: 100 mg via INTRAVENOUS

## 2021-10-25 MED ORDER — LACTATED RINGERS IV SOLN: INTRAVENOUS | Status: DC

## 2021-10-25 MED ORDER — KETOROLAC TROMETHAMINE 30 MG/ML IJ SOLN
30.0000 mg | Freq: Once | INTRAMUSCULAR | Status: AC
  Administered 2021-10-25: 30 mg via INTRAVENOUS

## 2021-10-25 MED ORDER — LIDOCAINE 2% (20 MG/ML) 5 ML SYRINGE
INTRAMUSCULAR | Status: DC | PRN
  Administered 2021-10-25: 50 mg via INTRAVENOUS

## 2021-10-25 NOTE — Discharge Instructions (Addendum)
You are being discharged to home.  Resume your previous diet.  We are waiting for your pathology results.  Your physician has recommended a repeat colonoscopy in 10 years for screening purposes.  start Miralax on a daily basis, uptitrate as needed.

## 2021-10-25 NOTE — Anesthesia Preprocedure Evaluation (Signed)
Anesthesia Evaluation  Patient identified by MRN, date of birth, ID band Patient awake    Reviewed: Allergy & Precautions, NPO status , Patient's Chart, lab work & pertinent test results  Airway Mallampati: II  TM Distance: >3 FB Neck ROM: Full    Dental  (+) Dental Advisory Given, Loose,    Pulmonary Current Smoker and Patient abstained from smoking.,    Pulmonary exam normal breath sounds clear to auscultation       Cardiovascular hypertension, Pt. on medications + CAD, + Past MI and + CABG  Normal cardiovascular exam Rhythm:Regular Rate:Normal     Neuro/Psych  Headaches, PSYCHIATRIC DISORDERS Anxiety Depression    GI/Hepatic negative GI ROS, Neg liver ROS,   Endo/Other  negative endocrine ROS  Renal/GU negative Renal ROS  negative genitourinary   Musculoskeletal  (+) Arthritis , Osteoarthritis,    Abdominal   Peds negative pediatric ROS (+)  Hematology negative hematology ROS (+)   Anesthesia Other Findings   Reproductive/Obstetrics negative OB ROS                             Anesthesia Physical Anesthesia Plan  ASA: 3  Anesthesia Plan:    Post-op Pain Management: Minimal or no pain anticipated   Induction: Intravenous  PONV Risk Score and Plan: TIVA  Airway Management Planned: Nasal Cannula, Natural Airway and Simple Face Mask  Additional Equipment:   Intra-op Plan:   Post-operative Plan:   Informed Consent: I have reviewed the patients History and Physical, chart, labs and discussed the procedure including the risks, benefits and alternatives for the proposed anesthesia with the patient or authorized representative who has indicated his/her understanding and acceptance.     Dental advisory given  Plan Discussed with: CRNA and Surgeon  Anesthesia Plan Comments:         Anesthesia Quick Evaluation

## 2021-10-25 NOTE — Anesthesia Postprocedure Evaluation (Signed)
Anesthesia Post Note  Patient: Dominique Hinton  Procedure(s) Performed: COLONOSCOPY WITH PROPOFOL BIOPSY  Patient location during evaluation: Phase II Anesthesia Type: General Level of consciousness: awake and alert and oriented Pain management: pain level controlled Vital Signs Assessment: post-procedure vital signs reviewed and stable Respiratory status: spontaneous breathing, nonlabored ventilation and respiratory function stable Cardiovascular status: blood pressure returned to baseline and stable Postop Assessment: no apparent nausea or vomiting Anesthetic complications: no   No notable events documented.   Last Vitals:  Vitals:   10/25/21 1125 10/25/21 1330  BP: (!) 142/96 106/79  Pulse: 78 76  Resp: (!) 23 19  Temp: 36.8 C   SpO2: 96% 96%    Last Pain:  Vitals:   10/25/21 1330  TempSrc:   PainSc: 0-No pain                 Reia Viernes C Sowmya Partridge

## 2021-10-25 NOTE — Op Note (Signed)
[facility] Patient Name: Dominique Hinton Procedure Date: 10/25/2021 12:31 PM MRN: [id number] Date of Birth: Jul 17, 1971 Attending MD: Maylon Peppers ,  CSN: [id number] Age: 51 Admit Type: Outpatient Procedure:                Colonoscopy Indications:              Abdominal pain in the left lower quadrant Providers:                Maylon Peppers, Caprice Kluver, Aram Candela Referring MD:              Medicines:                Monitored Anesthesia Care Complications:            No immediate complications. Estimated Blood Loss:     Estimated blood loss: none. Procedure:                Pre-Anesthesia Assessment:                           - Prior to the procedure, a History and Physical                            was performed, and patient medications, allergies                            and sensitivities were reviewed. The patient's                            tolerance of previous anesthesia was reviewed.                           - The risks and benefits of the procedure and the                            sedation options and risks were discussed with the                            patient. All questions were answered and informed                            consent was obtained.                           After obtaining informed consent, the colonoscope                            was passed under direct vision. Throughout the                            procedure, the patient's blood pressure, pulse, and                            oxygen saturations were monitored continuously. The                            [id number]-[id number] ([id number])  scope was introduced through                            the anus and advanced to the the cecum, identified                            by appendiceal orifice and ileocecal valve. The                            colonoscopy was performed without difficulty. The                            patient tolerated the procedure well. The quality                             of the bowel preparation was good. Scope In: 1:00:38 PM Scope Out: 1:21:51 PM Scope Withdrawal Time: 0 hours 17 minutes 47 seconds  Total Procedure Duration: 0 hours 21 minutes 13 seconds  Findings:      The perianal and digital rectal examinations were normal.      There was a small lipoma, in the ascending colon.      Scant focalized areas of mildly congested and erythematous mucosa was       found in the sigmoid colon (?SCAD). Biopsies were taken with a cold       forceps for histology.      The retroflexed view of the distal rectum and anal verge was normal and       showed no anal or rectal abnormalities. 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                           - Congested and erythematous focal area of mucosa                            in the sigmoid colon. Biopsied.                           - The distal rectum and anal verge are normal on                            retroflexion view. Moderate Sedation:      Per Anesthesia Care Recommendation:           - Discharge patient to home (ambulatory).                           - Resume previous diet.                           - Await pathology results.                           - Repeat colonoscopy in 10 years for screening  purposes.                           - start Miralax on a daily basis, uptitrate as                            needed. Procedure Code(s):        --- Professional ---                           [phone], Colonoscopy, flexible; with biopsy, single                            or multiple Diagnosis Code(s):        --- Professional ---                           D17.5, Benign lipomatous neoplasm of                            intra-abdominal organs                           K63.89, Other specified diseases of intestine                           R10.32, Left lower quadrant pain CPT copyright 2019 American [employer] Association. All rights reserved. The codes  documented in this report are preliminary and upon coder review may  be revised to meet current compliance requirements. Maylon Peppers, MD Maylon Peppers,  10/25/2021 1:28:13 PM This report has been signed electronically. Number of Addenda: 0

## 2021-10-25 NOTE — Transfer of Care (Signed)
Immediate Anesthesia Transfer of Care Note  Patient: Dominique Hinton  Procedure(s) Performed: COLONOSCOPY WITH PROPOFOL BIOPSY  Patient Location: Short Stay  Anesthesia Type:MAC  Level of Consciousness: sedated and patient cooperative  Airway & Oxygen Therapy: Patient Spontanous Breathing  Post-op Assessment: Report given to RN, Post -op Vital signs reviewed and stable and Patient moving all extremities  Post vital signs: Reviewed and stable  Last Vitals:  Vitals Value Taken Time  BP    Temp    Pulse    Resp    SpO2      Last Pain:  Vitals:   10/25/21 1255  TempSrc:   PainSc: 0-No pain         Complications: No notable events documented.

## 2021-10-25 NOTE — Interval H&P Note (Signed)
History and Physical Interval Note:  10/25/2021 12:07 PM  Dominique Hinton  has presented today for surgery, with the diagnosis of Chronic Lower Abd Pain.  The various methods of treatment have been discussed with the patient and family. After consideration of risks, benefits and other options for treatment, the patient has consented to  Procedure(s) with comments: COLONOSCOPY WITH PROPOFOL (N/A) - 105 as a surgical intervention.  The patient's history has been reviewed, patient examined, no change in status, stable for surgery.  I have reviewed the patient's chart and labs.  Questions were answered to the patient's satisfaction.     [name]

## 2021-10-27 LAB — SURGICAL PATHOLOGY

## 2021-10-28 ENCOUNTER — Encounter (HOSPITAL_COMMUNITY): Payer: Self-pay | Admitting: Gastroenterology

## 2021-10-31 ENCOUNTER — Other Ambulatory Visit: Payer: Self-pay | Admitting: Adult Health

## 2021-11-02 DIAGNOSIS — M47816 Spondylosis without myelopathy or radiculopathy, lumbar region: Secondary | ICD-10-CM | POA: Diagnosis not present

## 2021-11-04 ENCOUNTER — Other Ambulatory Visit: Payer: Self-pay | Admitting: Physician Assistant

## 2021-11-29 ENCOUNTER — Other Ambulatory Visit: Payer: Self-pay | Admitting: Adult Health

## 2021-11-30 DIAGNOSIS — M47816 Spondylosis without myelopathy or radiculopathy, lumbar region: Secondary | ICD-10-CM | POA: Diagnosis not present

## 2021-12-06 ENCOUNTER — Ambulatory Visit: Payer: Medicaid Other | Admitting: Physician Assistant

## 2021-12-13 ENCOUNTER — Encounter: Payer: Self-pay | Admitting: Adult Health

## 2021-12-13 ENCOUNTER — Ambulatory Visit: Payer: Medicaid Other | Admitting: Adult Health

## 2021-12-13 VITALS — BP 101/68 | HR 91 | Ht 66.0 in | Wt 181.0 lb

## 2021-12-13 DIAGNOSIS — R110 Nausea: Secondary | ICD-10-CM

## 2021-12-13 DIAGNOSIS — F32A Depression, unspecified: Secondary | ICD-10-CM

## 2021-12-13 DIAGNOSIS — Z90710 Acquired absence of both cervix and uterus: Secondary | ICD-10-CM | POA: Diagnosis not present

## 2021-12-13 DIAGNOSIS — R232 Flushing: Secondary | ICD-10-CM

## 2021-12-13 DIAGNOSIS — F419 Anxiety disorder, unspecified: Secondary | ICD-10-CM

## 2021-12-13 DIAGNOSIS — R61 Generalized hyperhidrosis: Secondary | ICD-10-CM

## 2021-12-13 MED ORDER — CITALOPRAM HYDROBROMIDE 40 MG PO TABS: 40.0000 mg | ORAL_TABLET | Freq: Every day | ORAL | 3 refills | Status: DC

## 2021-12-13 NOTE — Progress Notes (Signed)
?  Subjective:  ?  ? Patient ID: Dominique Hinton, female   DOB: May 20, 1971, 51 y.o.   MRN: [id number] ? ?HPI ?Dominique Hinton is a 51 year old white female,divorced, sp hysterectomy, back in follow up on hot flashes and she is much better, may have 4-5 a day, instead of all day. Still sweats at night some. She is using Soy and Women's Natural Transition and celexa and has clonidine from PCP. ?PCP is Dr Willey Blade ? ?Review of Systems ?Feels better, hot flashes better ?Still with some night sweats ?Has chronic back pain ?  Reviewed past medical,surgical, social and family history. Reviewed medications and allergies.  ?Objective:  ? Physical Exam ?BP 101/68 (BP Location: Right Arm, Patient Position: Sitting, Cuff Size: Normal)   Pulse 91   Ht '5\' 6"'$  (1.676 m)   Wt 181 lb (82.1 kg)   BMI 29.21 kg/m?   ? Skin warm and dry.  Lungs: clear to ausculation bilaterally. Cardiovascular: regular rate and rhythm.  ? Upstream - 12/13/21 1129   ? ?  ? Pregnancy Intention Screening  ? Does the patient want to become pregnant in the next year? N/A   ? Does the patient's partner want to become pregnant in the next year? N/A   ? Would the patient like to discuss contraceptive options today? N/A   ?  ? Contraception Wrap Up  ? Current Method --   hyst  ? End Method --   hyst  ? Contraception Counseling Provided No   ? ?  ?  ? ?  ?  ? ?  12/13/2021  ? 11:12 AM 09/13/2021  ? 11:36 AM 08/23/2021  ?  3:31 PM  ?Depression screen PHQ 2/9  ?Decreased Interest '1 2 2  '$ ?Down, Depressed, Hopeless '1 1 1  '$ ?PHQ - 2 Score '2 3 3  '$ ?Altered sleeping 3 0 3  ?Tired, decreased energy '3 3 3  '$ ?Change in appetite 1 0 3  ?Feeling bad or failure about yourself  1 0 0  ?Trouble concentrating 1 0 1  ?Moving slowly or fidgety/restless 0 0 0  ?Suicidal thoughts 0 0 0  ?PHQ-9 Score '11 6 13  '$ ?  ?Assessment:  ?   ?1. Hot flashes ?Much better ?-Continue Soy and Women's Natural Transition, is on clonidine from PCP   ? -will increase celexa ? ?2. Anxiety and depression ?Will  increase Celexa to 40 mg 1 daily ?Meds ordered this encounter  ?Medications  ? citalopram (CELEXA) 40 MG tablet  ?  Sig: Take 1 tablet (40 mg total) by mouth daily.  ?  Dispense:  30 tablet  ?  Refill:  3  ?  Order Specific Question:   Supervising Provider  ?  Answer:   Tania Ade H [[id number]]  ?  ?3. Nausea ?Has phenergan if needed  ? ?4. S/P hysterectomy ? ? ?5. Night sweats ? ?   ?Plan:  ?   ?Follow up in 3 months or sooner if needed  ?   ?

## 2021-12-28 DIAGNOSIS — M545 Low back pain, unspecified: Secondary | ICD-10-CM | POA: Diagnosis not present

## 2022-01-06 DIAGNOSIS — M549 Dorsalgia, unspecified: Secondary | ICD-10-CM | POA: Diagnosis not present

## 2022-01-06 DIAGNOSIS — Z79899 Other long term (current) drug therapy: Secondary | ICD-10-CM | POA: Diagnosis not present

## 2022-01-06 DIAGNOSIS — M47816 Spondylosis without myelopathy or radiculopathy, lumbar region: Secondary | ICD-10-CM | POA: Diagnosis not present

## 2022-01-06 DIAGNOSIS — I10 Essential (primary) hypertension: Secondary | ICD-10-CM | POA: Diagnosis not present

## 2022-01-06 DIAGNOSIS — G8929 Other chronic pain: Secondary | ICD-10-CM | POA: Diagnosis not present

## 2022-01-06 DIAGNOSIS — Z0130 Encounter for examination of blood pressure without abnormal findings: Secondary | ICD-10-CM | POA: Diagnosis not present

## 2022-01-06 DIAGNOSIS — E7800 Pure hypercholesterolemia, unspecified: Secondary | ICD-10-CM | POA: Diagnosis not present

## 2022-01-09 ENCOUNTER — Other Ambulatory Visit: Payer: Self-pay | Admitting: Cardiology

## 2022-01-12 DIAGNOSIS — Z79899 Other long term (current) drug therapy: Secondary | ICD-10-CM | POA: Diagnosis not present

## 2022-01-13 DIAGNOSIS — F32A Depression, unspecified: Secondary | ICD-10-CM | POA: Diagnosis not present

## 2022-01-13 DIAGNOSIS — Z6829 Body mass index (BMI) 29.0-29.9, adult: Secondary | ICD-10-CM | POA: Diagnosis not present

## 2022-01-13 DIAGNOSIS — Z0130 Encounter for examination of blood pressure without abnormal findings: Secondary | ICD-10-CM | POA: Diagnosis not present

## 2022-01-13 DIAGNOSIS — Z79899 Other long term (current) drug therapy: Secondary | ICD-10-CM | POA: Diagnosis not present

## 2022-01-13 DIAGNOSIS — M47816 Spondylosis without myelopathy or radiculopathy, lumbar region: Secondary | ICD-10-CM | POA: Diagnosis not present

## 2022-01-13 DIAGNOSIS — F419 Anxiety disorder, unspecified: Secondary | ICD-10-CM | POA: Diagnosis not present

## 2022-01-17 ENCOUNTER — Other Ambulatory Visit: Payer: Self-pay | Admitting: Adult Health

## 2022-01-17 DIAGNOSIS — Z79899 Other long term (current) drug therapy: Secondary | ICD-10-CM | POA: Diagnosis not present

## 2022-01-17 MED ORDER — PROMETHAZINE HCL 25 MG PO TABS: 25.0000 mg | ORAL_TABLET | Freq: Four times a day (QID) | ORAL | 1 refills | Status: DC | PRN

## 2022-01-17 NOTE — Progress Notes (Signed)
Refilled phenergan

## 2022-02-07 ENCOUNTER — Other Ambulatory Visit: Payer: Self-pay | Admitting: *Deleted

## 2022-02-08 MED ORDER — CITALOPRAM HYDROBROMIDE 40 MG PO TABS
40.0000 mg | ORAL_TABLET | Freq: Every day | ORAL | 3 refills | Status: DC
Start: 2022-02-08 — End: 2022-05-22

## 2022-02-14 DIAGNOSIS — Z79899 Other long term (current) drug therapy: Secondary | ICD-10-CM | POA: Diagnosis not present

## 2022-02-14 DIAGNOSIS — R945 Abnormal results of liver function studies: Secondary | ICD-10-CM | POA: Diagnosis not present

## 2022-02-15 ENCOUNTER — Encounter (INDEPENDENT_AMBULATORY_CARE_PROVIDER_SITE_OTHER): Payer: Self-pay | Admitting: Gastroenterology

## 2022-02-16 DIAGNOSIS — N950 Postmenopausal bleeding: Secondary | ICD-10-CM | POA: Diagnosis not present

## 2022-02-16 DIAGNOSIS — I2510 Atherosclerotic heart disease of native coronary artery without angina pectoris: Secondary | ICD-10-CM | POA: Diagnosis not present

## 2022-02-16 DIAGNOSIS — I10 Essential (primary) hypertension: Secondary | ICD-10-CM | POA: Diagnosis not present

## 2022-02-23 DIAGNOSIS — R945 Abnormal results of liver function studies: Secondary | ICD-10-CM | POA: Diagnosis not present

## 2022-03-14 ENCOUNTER — Encounter: Payer: Self-pay | Admitting: Adult Health

## 2022-03-14 ENCOUNTER — Ambulatory Visit: Payer: Medicaid Other | Admitting: Adult Health

## 2022-03-14 VITALS — BP 135/93 | HR 84 | Ht 66.0 in | Wt 177.5 lb

## 2022-03-14 DIAGNOSIS — Z90710 Acquired absence of both cervix and uterus: Secondary | ICD-10-CM | POA: Diagnosis not present

## 2022-03-14 DIAGNOSIS — F32A Depression, unspecified: Secondary | ICD-10-CM

## 2022-03-14 DIAGNOSIS — F419 Anxiety disorder, unspecified: Secondary | ICD-10-CM | POA: Diagnosis not present

## 2022-03-14 DIAGNOSIS — K047 Periapical abscess without sinus: Secondary | ICD-10-CM | POA: Diagnosis not present

## 2022-03-14 DIAGNOSIS — R232 Flushing: Secondary | ICD-10-CM

## 2022-03-14 MED ORDER — PROMETHAZINE HCL 25 MG PO TABS: 25.0000 mg | ORAL_TABLET | Freq: Four times a day (QID) | ORAL | 1 refills | Status: DC | PRN

## 2022-03-14 MED ORDER — AMOXICILLIN 500 MG PO CAPS: 500.0000 mg | ORAL_CAPSULE | Freq: Three times a day (TID) | ORAL | 0 refills | Status: DC

## 2022-03-14 NOTE — Progress Notes (Signed)
  Subjective:     Patient ID: Dominique Hinton, female   DOB: 1971/02/13, 51 y.o.   MRN: [id number]  HPI Dominique Hinton is a 51 year old white female, widowed, sp hysterectomy back in follow up on anxiety and depression and hot flashes. Has broken tooth, happed at the beach, just got back last night. PCP is Dr Willey Blade.  Review of Systems Not has depressed +hot flashes,sweats  Feels hot then cold Reviewed past medical,surgical, social and family history. Reviewed medications and allergies.     Objective:   Physical Exam BP (!) 135/93 (BP Location: Left Arm, Patient Position: Sitting, Cuff Size: Normal)   Pulse 84   Ht '5\' 6"'$  (1.676 m)   Wt 177 lb 8 oz (80.5 kg)   BMI 28.65 kg/m     Skin warm and dry. Neck: mid line trachea, normal thyroid, good ROM, no lymphadenopathy noted. Lungs: clear to ausculation bilaterally. Cardiovascular: regular rate and rhythm. Right jaw swollen has broken tooth Fall risk is low    03/14/2022    9:42 AM 12/13/2021   11:12 AM 09/13/2021   11:36 AM  Depression screen PHQ 2/9  Decreased Interest '1 1 2  '$ Down, Depressed, Hopeless '1 1 1  '$ PHQ - 2 Score '2 2 3  '$ Altered sleeping 2 3 0  Tired, decreased energy '2 3 3  '$ Change in appetite 2 1 0  Feeling bad or failure about yourself  1 1 0  Trouble concentrating 1 1 0  Moving slowly or fidgety/restless 0 0 0  Suicidal thoughts 0 0 0  PHQ-9 Score '10 11 6       '$ 03/14/2022    9:44 AM 08/23/2021    3:32 PM  GAD 7 : Generalized Anxiety Score  Nervous, Anxious, on Edge 1 1  Control/stop worrying 0 0  Worry too much - different things 0 1  Trouble relaxing 1 1  Restless 0 1  Easily annoyed or irritable 1 1  Afraid - awful might happen 0 0  Total GAD 7 Score 3 5      Upstream - 03/14/22 0941       Pregnancy Intention Screening   Does the patient want to become pregnant in the next year? N/A    Does the patient's partner want to become pregnant in the next year? N/A    Would the patient like to discuss  contraceptive options today? N/A      Contraception Wrap Up   Current Method Female Sterilization   hyst   End Method Female Sterilization   hyst   Contraception Counseling Provided No             Assessment:     1. Anxiety and depression Continue Celexa 40 mg 1 daily, has refills  2. Hot flashes She feels hot then cold and sweats, will check labs, consider veozah for hot flashes if labs normal  Still taking SOY, and Women;s Natural Transition and is on clonidine from PCP  - Comprehensive metabolic panel - TSH  3. S/P hysterectomy  4. Tooth infection - CBC    Will rx amoxil 500 mg take 1 tid for 7 days and use warm salt water or peroxide and water to swish Call dentist    Plan:    Refilled phenergan 25 mg  Will talk when labs back Follow up TBD

## 2022-03-15 LAB — COMPREHENSIVE METABOLIC PANEL
ALT: 96 IU/L — ABNORMAL HIGH (ref 0–32)
AST: 90 IU/L — ABNORMAL HIGH (ref 0–40)
Albumin/Globulin Ratio: 1.8 (ref 1.2–2.2)
Albumin: 4.4 g/dL (ref 3.9–4.9)
Alkaline Phosphatase: 104 IU/L (ref 44–121)
BUN/Creatinine Ratio: 7 — ABNORMAL LOW (ref 9–23)
BUN: 5 mg/dL — ABNORMAL LOW (ref 6–24)
Bilirubin Total: 1.2 mg/dL (ref 0.0–1.2)
CO2: 22 mmol/L (ref 20–29)
Calcium: 9.2 mg/dL (ref 8.7–10.2)
Chloride: 101 mmol/L (ref 96–106)
Creatinine, Ser: 0.76 mg/dL (ref 0.57–1.00)
Globulin, Total: 2.5 g/dL (ref 1.5–4.5)
Glucose: 101 mg/dL — ABNORMAL HIGH (ref 70–99)
Potassium: 3.8 mmol/L (ref 3.5–5.2)
Sodium: 140 mmol/L (ref 134–144)
Total Protein: 6.9 g/dL (ref 6.0–8.5)
eGFR: 95 mL/min/{1.73_m2} (ref >59)

## 2022-03-15 LAB — TSH: TSH: 2.77 u[IU]/mL (ref 0.450–4.500)

## 2022-03-15 LAB — CBC
Hematocrit: 48.9 % — ABNORMAL HIGH (ref 34.0–46.6)
Hemoglobin: 16.9 g/dL — ABNORMAL HIGH (ref 11.1–15.9)
MCH: 32.3 pg (ref 26.6–33.0)
MCHC: 34.6 g/dL (ref 31.5–35.7)
MCV: 94 fL (ref 79–97)
Platelets: 291 10*3/uL (ref 150–450)
RBC: 5.23 x10E6/uL (ref 3.77–5.28)
RDW: 13.6 % (ref 11.7–15.4)
WBC: 8.7 10*3/uL (ref 3.4–10.8)

## 2022-03-16 DIAGNOSIS — E7800 Pure hypercholesterolemia, unspecified: Secondary | ICD-10-CM | POA: Diagnosis not present

## 2022-03-16 DIAGNOSIS — Z0130 Encounter for examination of blood pressure without abnormal findings: Secondary | ICD-10-CM | POA: Diagnosis not present

## 2022-03-16 DIAGNOSIS — K769 Liver disease, unspecified: Secondary | ICD-10-CM | POA: Diagnosis not present

## 2022-03-16 DIAGNOSIS — Z6829 Body mass index (BMI) 29.0-29.9, adult: Secondary | ICD-10-CM | POA: Diagnosis not present

## 2022-03-16 DIAGNOSIS — Z79899 Other long term (current) drug therapy: Secondary | ICD-10-CM | POA: Diagnosis not present

## 2022-03-16 DIAGNOSIS — M129 Arthropathy, unspecified: Secondary | ICD-10-CM | POA: Diagnosis not present

## 2022-03-16 DIAGNOSIS — K743 Primary biliary cirrhosis: Secondary | ICD-10-CM | POA: Diagnosis not present

## 2022-03-16 DIAGNOSIS — F32A Depression, unspecified: Secondary | ICD-10-CM | POA: Diagnosis not present

## 2022-03-16 DIAGNOSIS — M47816 Spondylosis without myelopathy or radiculopathy, lumbar region: Secondary | ICD-10-CM | POA: Diagnosis not present

## 2022-03-16 DIAGNOSIS — I252 Old myocardial infarction: Secondary | ICD-10-CM | POA: Diagnosis not present

## 2022-03-16 DIAGNOSIS — I10 Essential (primary) hypertension: Secondary | ICD-10-CM | POA: Diagnosis not present

## 2022-03-16 DIAGNOSIS — F419 Anxiety disorder, unspecified: Secondary | ICD-10-CM | POA: Diagnosis not present

## 2022-03-21 DIAGNOSIS — Z79899 Other long term (current) drug therapy: Secondary | ICD-10-CM | POA: Diagnosis not present

## 2022-03-27 ENCOUNTER — Ambulatory Visit
Admission: EM | Admit: 2022-03-27 | Discharge: 2022-03-27 | Disposition: A | Discharge status: Home / Self Care | Payer: Medicaid Other | Attending: Family Medicine | Admitting: Family Medicine

## 2022-03-27 DIAGNOSIS — H66001 Acute suppurative otitis media without spontaneous rupture of ear drum, right ear: Secondary | ICD-10-CM | POA: Diagnosis not present

## 2022-03-27 MED ORDER — AMOXICILLIN 875 MG PO TABS: 875.0000 mg | ORAL_TABLET | Freq: Two times a day (BID) | ORAL | 0 refills | Status: DC

## 2022-03-27 NOTE — ED Provider Notes (Signed)
[facility]    CSN: [id number] Arrival date & time: 03/27/22  1603      History   Chief Complaint Chief Complaint  Patient presents with   Otalgia    HPI Dominique Hinton is a 51 y.o. female.   Presenting today with progressively worsening severe right ear pain that she states is now throbbing, sharp, stabbing with muffled hearing.  She states she has been having some sinus issues recently and the right sinus region also has been hurting leading up to the ear hurting.  Denies fever, chills, drainage from the ear, headache, nausea, vomiting.  Has been taking her typical pain regimen, allergy regimen with no relief.    Past Medical History:  Diagnosis Date   Anxiety    Arthritis    Back pain    CAD (coronary artery disease)    Angioplasty of D1 January 2022   Essential hypertension    NSTEMI (non-ST elevated myocardial infarction) [facility])    January 2022    Patient Active Problem List   Diagnosis Date Noted   Tooth infection 03/14/2022   Night sweats 12/13/2021   Constipation 10/18/2021   Lower abdominal pain 09/13/2021   Screening for colorectal cancer 09/13/2021   History of MI (myocardial infarction) 08/23/2021   Anxiety and depression 08/23/2021   Pelvic pain 08/23/2021   Hot flashes 08/23/2021   Nausea 08/23/2021   Screening mammogram for breast cancer 08/23/2021   S/P hysterectomy 08/23/2021   S/P PTCA (percutaneous transluminal coronary angioplasty)    NSTEMI (non-ST elevated myocardial infarction) ([place]) 09/21/2020   Chest pain 09/20/2020   Obesity (BMI 30.0-34.9) 09/20/2020   Essential hypertension 09/20/2020   Tobacco abuse 09/20/2020   Elevated troponin 09/20/2020   Pain in both upper extremities 06/26/2019   Weakness 06/26/2019    Past Surgical History:  Procedure Laterality Date   ABDOMINAL HYSTERECTOMY     APPENDECTOMY     BIOPSY  10/25/2021   Procedure: BIOPSY;  Surgeon: Harvel Quale, MD;  [facility]: [facility];   Service: Gastroenterology;;   BREAST SURGERY     CARDIAC CATHETERIZATION  09/21/2020   CHOLECYSTECTOMY     COLONOSCOPY WITH PROPOFOL N/A 10/25/2021   Procedure: COLONOSCOPY WITH PROPOFOL;  Surgeon: Harvel Quale, MD;  Location: [facility];  Service: Gastroenterology;  Laterality: N/A;  105   CORONARY BALLOON ANGIOPLASTY  09/21/2020   CORONARY/GRAFT ACUTE MI REVASCULARIZATION N/A 09/21/2020   Procedure: Coronary/Graft Acute MI Revascularization;  Surgeon: Jettie Booze, MD;  Location: [place] [facility];  Service: Cardiovascular;  Laterality: N/A;   LEFT HEART CATH AND CORONARY ANGIOGRAPHY N/A 09/21/2020   Procedure: LEFT HEART CATH AND CORONARY ANGIOGRAPHY;  Surgeon: Jettie Booze, MD;  Location: [place] [facility];  Service: Cardiovascular;  Laterality: N/A;   TUBAL LIGATION      OB History     Gravida  4   Para  3   Term  3   Preterm      AB  1   Living         SAB  1   IAB      Ectopic      Multiple      Live Births               Home Medications    Prior to Admission medications   Medication Sig Start Date End Date Taking? Authorizing Provider  amoxicillin (AMOXIL) 875 MG tablet Take 1 tablet (875 mg total)  by mouth 2 (two) times daily. 03/27/22  Yes Volney American, PA-C  amoxicillin (AMOXIL) 500 MG capsule Take 1 capsule (500 mg total) by mouth 3 (three) times daily. 03/14/22   Estill Dooms, NP  aspirin 81 MG chewable tablet Chew 1 tablet (81 mg total) by mouth daily. 09/22/20   Cheryln Manly, NP  citalopram (CELEXA) 40 MG tablet Take 1 tablet (40 mg total) by mouth daily. 02/08/22   Estill Dooms, NP  cloNIDine (CATAPRES) 0.2 MG tablet Take 0.2 mg by mouth at bedtime. 10/07/21   [provider]  dicyclomine (BENTYL) 10 MG capsule Take 1 capsule (10 mg total) by mouth 2 (two) times daily. Patient taking differently: Take 10 mg by mouth as needed. 10/17/21   Carlan, Chelsea L, NP  Fexofenadine HCl  (ALLEGRA PO) Take by mouth.    [provider]  gabapentin (NEURONTIN) 300 MG capsule Take 600 mg by mouth 2 (two) times daily. 08/19/20   [provider]  hydrochlorothiazide (HYDRODIURIL) 25 MG tablet Take 1/2 (one-half) tablet by mouth once daily 11/04/21   Satira Sark, MD  HYDROcodone-acetaminophen [facility]) 10-325 MG tablet Take 1 tablet by mouth 4 (four) times daily as needed for pain. 09/16/20   [provider]  losartan (COZAAR) 100 MG tablet Take 100 mg by mouth daily. 01/26/19   [provider]  nitroGLYCERIN (NITROSTAT) 0.4 MG SL tablet PLACE 1 TABLET (0.4 MG TOTAL) UNDER THE TONGUE EVERY FIVE MINUTES AS NEEDED FOR CHEST PAIN. 09/22/20 10/21/21  Cheryln Manly, NP  OVER THE COUNTER MEDICATION Take 1 capsule by mouth daily. Women's Natural Transition    [provider]  promethazine (PHENERGAN) 25 MG tablet Take 1 tablet (25 mg total) by mouth every 6 (six) hours as needed. 03/14/22   Estill Dooms, NP  rosuvastatin (CRESTOR) 20 MG tablet TAKE 1 TABLET BY MOUTH EVERY DAY AT 6PM 01/10/22   Satira Sark, MD  tiZANidine (ZANAFLEX) 4 MG tablet Take 4-8 mg by mouth 3 (three) times daily as needed for muscle spasms. 09/16/20   [provider]  UNABLE TO FIND Take 1,500 mg by mouth daily. Soy capsules    [provider]  valACYclovir (VALTREX) 1000 MG tablet Take 1,000 mg by mouth daily. 05/06/19   [provider]    Family History Family History  Problem Relation Age of Onset   Alzheimer's disease Father    Hypertension Mother    Other Mother        "borderline diabetic"   Dementia Mother    Thyroid disease Mother    Thyroid disease Brother    Anxiety disorder Daughter    Depression Daughter    Depression Daughter    Anxiety disorder Daughter    ODD Son    ADD / ADHD Son     Social History Social History   Tobacco Use   Smoking status: Every Day    Packs/day: 1.00    Years: 30.00    Total pack  years: 30.00    Types: Cigarettes   Smokeless tobacco: Never  Vaping Use   Vaping Use: Never used  Substance Use Topics   Alcohol use: Yes    Comment: occasionally   Drug use: Yes    Types: Marijuana    Comment: daily     Allergies   Cortisone and Doxycycline   Review of Systems Review of Systems Per HPI  Physical Exam Triage Vital Signs ED Triage Vitals  Enc  Vitals Group     BP 03/27/22 1754 (!) 148/93     Pulse Rate 03/27/22 1754 65     Resp 03/27/22 1754 20     Temp 03/27/22 1754 98.8 F (37.1 C)     Temp src --      SpO2 03/27/22 1754 96 %     Weight --      Height --      Head Circumference --      Peak Flow --      Pain Score 03/27/22 1752 9     Pain Loc --      Pain Edu? --      Excl. in [place]? --    No data found.  Updated Vital Signs BP (!) 148/93   Pulse 65   Temp 98.8 F (37.1 C)   Resp 20   SpO2 96%   Visual Acuity Right Eye Distance:   Left Eye Distance:   Bilateral Distance:    Right Eye Near:   Left Eye Near:    Bilateral Near:     Physical Exam Vitals and nursing note reviewed.  Constitutional:      Appearance: Normal appearance. She is not ill-appearing.  HENT:     Head: Atraumatic.     Ears:     Comments: Right TM erythematous, edematous.  Left TM injected    Nose: Congestion present.     Mouth/Throat:     Mouth: Mucous membranes are moist.  Eyes:     Extraocular Movements: Extraocular movements intact.     Conjunctiva/sclera: Conjunctivae normal.  Cardiovascular:     Rate and Rhythm: Normal rate and regular rhythm.     Heart sounds: Normal heart sounds.  Pulmonary:     Effort: Pulmonary effort is normal.     Breath sounds: Normal breath sounds.  Musculoskeletal:        General: Normal range of motion.     Cervical back: Normal range of motion and neck supple.  Skin:    General: Skin is warm and dry.  Neurological:     Mental Status: She is alert and oriented to person, place, and time.  Psychiatric:        Mood  and Affect: Mood normal.        Thought Content: Thought content normal.        Judgment: Judgment normal.      UC Treatments / Results  Labs (all labs ordered are listed, but only abnormal results are displayed) Labs Reviewed - No data to display  EKG   Radiology No results found.  Procedures Procedures (including critical care time)  Medications Ordered in UC Medications - No data to display  Initial Impression / Assessment and Plan / UC Course  I have reviewed the triage vital signs and the nursing notes.  Pertinent labs & imaging results that were available during my care of the patient were reviewed by me and considered in my medical decision making (see chart for details).     Treat with Amoxil, Flonase, Sudafed, continued allergy regimen.  Return for worsening symptoms.  Final Clinical Impressions(s) / UC Diagnoses   Final diagnoses:  Acute suppurative otitis media of right ear without spontaneous rupture of tympanic membrane, recurrence not specified   Discharge Instructions   None    ED Prescriptions     Medication Sig Dispense Auth. Provider   amoxicillin (AMOXIL) 875 MG tablet Take 1 tablet (875 mg total) by mouth 2 (two) times daily.  20 tablet Volney American, [place]      PDMP not reviewed this encounter.   Volney American, [place] 03/27/22 [phone]

## 2022-03-27 NOTE — ED Triage Notes (Signed)
Pt presents with c/o right ear pain but also has had right dental pain recently as well

## 2022-03-31 ENCOUNTER — Encounter: Payer: Self-pay | Admitting: *Deleted

## 2022-04-14 DIAGNOSIS — M47816 Spondylosis without myelopathy or radiculopathy, lumbar region: Secondary | ICD-10-CM | POA: Diagnosis not present

## 2022-04-14 DIAGNOSIS — Z6829 Body mass index (BMI) 29.0-29.9, adult: Secondary | ICD-10-CM | POA: Diagnosis not present

## 2022-04-14 DIAGNOSIS — Z79899 Other long term (current) drug therapy: Secondary | ICD-10-CM | POA: Diagnosis not present

## 2022-04-14 DIAGNOSIS — K769 Liver disease, unspecified: Secondary | ICD-10-CM | POA: Diagnosis not present

## 2022-04-14 DIAGNOSIS — E7800 Pure hypercholesterolemia, unspecified: Secondary | ICD-10-CM | POA: Diagnosis not present

## 2022-04-14 DIAGNOSIS — K743 Primary biliary cirrhosis: Secondary | ICD-10-CM | POA: Diagnosis not present

## 2022-04-14 DIAGNOSIS — F419 Anxiety disorder, unspecified: Secondary | ICD-10-CM | POA: Diagnosis not present

## 2022-04-14 DIAGNOSIS — I252 Old myocardial infarction: Secondary | ICD-10-CM | POA: Diagnosis not present

## 2022-04-14 DIAGNOSIS — Z0130 Encounter for examination of blood pressure without abnormal findings: Secondary | ICD-10-CM | POA: Diagnosis not present

## 2022-04-14 DIAGNOSIS — F32A Depression, unspecified: Secondary | ICD-10-CM | POA: Diagnosis not present

## 2022-04-14 DIAGNOSIS — I10 Essential (primary) hypertension: Secondary | ICD-10-CM | POA: Diagnosis not present

## 2022-04-17 ENCOUNTER — Ambulatory Visit (INDEPENDENT_AMBULATORY_CARE_PROVIDER_SITE_OTHER): Payer: Medicaid Other | Admitting: Gastroenterology

## 2022-04-18 ENCOUNTER — Encounter (INDEPENDENT_AMBULATORY_CARE_PROVIDER_SITE_OTHER): Payer: Self-pay | Admitting: Gastroenterology

## 2022-04-18 ENCOUNTER — Ambulatory Visit (INDEPENDENT_AMBULATORY_CARE_PROVIDER_SITE_OTHER): Payer: Medicaid Other | Admitting: Gastroenterology

## 2022-04-18 VITALS — BP 110/77 | HR 78 | Temp 98.3°F | Ht 66.0 in | Wt 183.2 lb

## 2022-04-18 DIAGNOSIS — R1013 Epigastric pain: Secondary | ICD-10-CM

## 2022-04-18 DIAGNOSIS — R7989 Other specified abnormal findings of blood chemistry: Secondary | ICD-10-CM | POA: Diagnosis not present

## 2022-04-18 DIAGNOSIS — R110 Nausea: Secondary | ICD-10-CM

## 2022-04-18 DIAGNOSIS — K581 Irritable bowel syndrome with constipation: Secondary | ICD-10-CM

## 2022-04-18 DIAGNOSIS — K3184 Gastroparesis: Secondary | ICD-10-CM | POA: Diagnosis not present

## 2022-04-18 MED ORDER — DICYCLOMINE HCL 10 MG PO CAPS: 10.0000 mg | ORAL_CAPSULE | Freq: Two times a day (BID) | ORAL | 1 refills | Status: DC | PRN

## 2022-04-18 MED ORDER — METOCLOPRAMIDE HCL 10 MG PO TABS
5.0000 mg | ORAL_TABLET | Freq: Three times a day (TID) | ORAL | 1 refills | Status: DC
Start: 2022-04-18 — End: 2022-08-02

## 2022-04-18 NOTE — H&P (View-Only) (Signed)
Referring Provider: Asencion Noble, MD Primary Care Physician:  Asencion Noble, MD Primary GI Physician: Jenetta Downer   Chief Complaint  Patient presents with   Abdominal Pain    6 month follow up. Reports abdominal pain is gone as long as she takes dicyclomine bid. Not much of an appetite. Gets full quick. Usually eats once daily. Still having a lot of nausea. Takes phenergan at least 3 times per week.    HPI:   Dominique Hinton is a 51 y.o. female with past medical history of  anxiety, arthritis, back pain, CAD, HTN, NSTEMI (2022).  Patient presenting today for follow up of abdominal pain.  History: Last seen February 2023 with chronic abdominal pain, occurring 1-2x per month, improved with defecation. Reported hx of "slow stomach" and mix of both constipation and looser stools. Endorsed occasional bloating. Scheduled for colonoscopy, given bentyl '10mg'$  BID PRN, low fodmap diet and encouraged to start miralax.   Present:  Patient states that she is taking bentyl '10mg'$  BID with good results, previously having some LLQ pain though bentyl has helped to keep this at [place]. She is taking 2 stool softeners nightly. She cannot tolerate miralax. Has laxatives at home but has not needed these. Having a BM mostly daily now without straining. No rectal bleeding or melena.    She feels that appetite is not great, eats maybe one meal per day. Hx of gastroparesis as documented on GES in 2011, Has not been on anything for her gastroparesis in the past. She is not diabetic. She does endorses upper abdominal pain that occurs 3-4x week with worse nausea, usually no vomiting, ongoing for the past few months. Does not feel that eating makes the pain worse, she just does not feel hungry. Symptoms improved with anti emetics and sometimes milk. Has very rare GERD symptoms.  She is not using NSAIDs. Feels that nausea worsened when she started menopause. Upper abdominal pain can occur without having eaten anything.   Notably  liver enzymes elevated since January 2022 with AST 50, ALT 54, last elevated in July with AST 90 and ALT 96, states that pain management doctor at [place] ordered cholestyramine (which pt has not started) and liver US, states that she was told she has a "peanut sized" lesion on the liver ([facility]). Does not drink etoh. No other workup in regards to elevated LFTs.  Has been on rosuvastatin x1.5 years. Does take norco 10-'325mg'$  QID PRN and has been on this for the past few years.   GES: Feb 2011 severe gastroparesis Last Colonoscopy:10/25/21 - Small lipoma in the ascending colon. - Congested and erythematous focal area of mucosa in the sigmoid colon. Biopsied-healed ulcer but no active inflammation - The distal rectum and anal verge are normal on retroflexion view. Last Endoscopy: maybe 2011  Recommendations:  Repeat TCS in feb 2033  Past Medical History:  Diagnosis Date   Anxiety    Arthritis    Back pain    CAD (coronary artery disease)    Angioplasty of D1 January 2022   Essential hypertension    NSTEMI (non-ST elevated myocardial infarction) [facility])    January 2022    Past Surgical History:  Procedure Laterality Date   ABDOMINAL HYSTERECTOMY     APPENDECTOMY     BIOPSY  10/25/2021   Procedure: BIOPSY;  Surgeon: Harvel Quale, MD;  Location: [facility];  Service: Gastroenterology;;   BREAST SURGERY     CARDIAC CATHETERIZATION  09/21/2020   CHOLECYSTECTOMY  COLONOSCOPY WITH PROPOFOL N/A 10/25/2021   Procedure: COLONOSCOPY WITH PROPOFOL;  Surgeon: Harvel Quale, MD;  Location: [facility];  Service: Gastroenterology;  Laterality: N/A;  105   CORONARY BALLOON ANGIOPLASTY  09/21/2020   CORONARY/GRAFT ACUTE MI REVASCULARIZATION N/A 09/21/2020   Procedure: Coronary/Graft Acute MI Revascularization;  Surgeon: Jettie Booze, MD;  Location: [facility];  Service: Cardiovascular;  Laterality: N/A;   LEFT HEART CATH AND CORONARY  ANGIOGRAPHY N/A 09/21/2020   Procedure: LEFT HEART CATH AND CORONARY ANGIOGRAPHY;  Surgeon: Jettie Booze, MD;  Location: [facility];  Service: Cardiovascular;  Laterality: N/A;   TUBAL LIGATION      Current Outpatient Medications  Medication Sig Dispense Refill   aspirin 81 MG chewable tablet Chew 1 tablet (81 mg total) by mouth daily. 90 tablet 1   citalopram (CELEXA) 40 MG tablet Take 1 tablet (40 mg total) by mouth daily. 90 tablet 3   cloNIDine (CATAPRES) 0.2 MG tablet Take 0.2 mg by mouth at bedtime.     dicyclomine (BENTYL) 10 MG capsule Take 1 capsule (10 mg total) by mouth 2 (two) times daily. (Patient taking differently: Take 10 mg by mouth as needed.) 60 capsule 1   Fexofenadine HCl (ALLEGRA PO) Take by mouth.     gabapentin (NEURONTIN) 400 MG capsule Take 400 mg by mouth 2 (two) times daily.     hydrochlorothiazide (HYDRODIURIL) 25 MG tablet Take 1/2 (one-half) tablet by mouth once daily 45 tablet 3   HYDROcodone-acetaminophen (NORCO) 10-325 MG tablet Take 1 tablet by mouth 4 (four) times daily as needed for pain.     losartan (COZAAR) 100 MG tablet Take 100 mg by mouth daily.     nitroGLYCERIN (NITROSTAT) 0.4 MG SL tablet PLACE 1 TABLET (0.4 MG TOTAL) UNDER THE TONGUE EVERY FIVE MINUTES AS NEEDED FOR CHEST PAIN. 25 tablet 2   OVER THE COUNTER MEDICATION Take 1 capsule by mouth in the morning and at bedtime. Women's Natural Transition     promethazine (PHENERGAN) 25 MG tablet Take 1 tablet (25 mg total) by mouth every 6 (six) hours as needed. 30 tablet 1   rosuvastatin (CRESTOR) 20 MG tablet TAKE 1 TABLET BY MOUTH EVERY DAY AT 6PM 90 tablet 3   tiZANidine (ZANAFLEX) 4 MG tablet Take 4-8 mg by mouth 3 (three) times daily as needed for muscle spasms.     UNABLE TO FIND Take 1,500 mg by mouth daily. Soy capsules     valACYclovir (VALTREX) 1000 MG tablet Take 1,000 mg by mouth daily.     No current facility-administered medications for this visit.    Allergies as of  04/18/2022 - Review Complete 03/27/2022  Allergen Reaction Noted   Cortisone Shortness Of Breath and Other (See Comments) 06/17/2011   Doxycycline Rash 06/17/2011    Family History  Problem Relation Age of Onset   Alzheimer's disease Father    Hypertension Mother    Other Mother        "borderline diabetic"   Dementia Mother    Thyroid disease Mother    Thyroid disease Brother    Anxiety disorder Daughter    Depression Daughter    Depression Daughter    Anxiety disorder Daughter    ODD Son    ADD / ADHD Son     Social History   Socioeconomic History   Marital status: Widowed    Spouse name: Not on file   Number of children: 3   Years of  education: 12   Highest education level: Not on file  Occupational History   Occupation: waitress/cook  Tobacco Use   Smoking status: Every Day    Packs/day: 1.00    Years: 30.00    Total pack years: 30.00    Types: Cigarettes    Passive exposure: Current   Smokeless tobacco: Never  Vaping Use   Vaping Use: Never used  Substance and Sexual Activity   Alcohol use: Yes    Comment: occasionally   Drug use: Yes    Types: Marijuana    Comment: daily   Sexual activity: Yes    Birth control/protection: Surgical    Comment: hyst  Other Topics Concern   Not on file  Social History Narrative   Lives at home with daughter, Joslyn Hy and fiance.   10-12 cups caffeine per day.   Right-handed.   Social Determinants of Health   Financial Resource Strain: Medium Risk (08/23/2021)   Overall Financial Resource Strain (CARDIA)    Difficulty of Paying Living Expenses: Somewhat hard  Food Insecurity: Food Insecurity Present (08/23/2021)   Hunger Vital Sign    Worried About Running Out of Food in the Last Year: Sometimes true    Ran Out of Food in the Last Year: Never true  Transportation Needs: No Transportation Needs (08/23/2021)   PRAPARE - Hydrologist (Medical): No    Lack of Transportation (Non-Medical):  No  Physical Activity: Insufficiently Active (08/23/2021)   Exercise Vital Sign    Days of Exercise per Week: 2 days    Minutes of Exercise per Session: 10 min  Stress: Stress Concern Present (08/23/2021)   [place]    Feeling of Stress : To some extent  Social Connections: Moderately Isolated (08/23/2021)   Social Connection and Isolation Panel [NHANES]    Frequency of Communication with Friends and Family: More than three times a week    Frequency of Social Gatherings with Friends and Family: Once a week    Attends Religious Services: Never    Marine scientist or Organizations: No    Attends Music therapist: Never    Marital Status: Living with partner   Review of systems General: negative for malaise, night sweats, fever, chills, weight loss Neck: Negative for lumps, goiter, pain and significant neck swelling Resp: Negative for cough, wheezing, dyspnea at rest CV: Negative for chest pain, leg swelling, palpitations, orthopnea GI: denies melena, hematochezia, nausea, vomiting, diarrhea, constipation, dysphagia, odyonophagia, early satiety or unintentional weight loss. +nausea +epigastric pain +decreased appetite  MSK: Negative for joint pain or swelling, back pain, and muscle pain. Derm: Negative for itching or rash Psych: Denies depression, anxiety, memory loss, confusion. No homicidal or suicidal ideation.  Heme: Negative for prolonged bleeding, bruising easily, and swollen nodes. Endocrine: Negative for cold or heat intolerance, polyuria, polydipsia and goiter. Neuro: negative for tremor, gait imbalance, syncope and seizures. The remainder of the review of systems is noncontributory.  Physical Exam: BP 110/77 (BP Location: Left Arm, Patient Position: Sitting, Cuff Size: Large)   Pulse 78   Temp 98.3 F (36.8 C) (Oral)   Ht '5\' 6"'$  (1.676 m)   Wt 183 lb 3.2 oz (83.1 kg)   BMI 29.57 kg/m   General:   Alert and oriented. No distress noted. Pleasant and cooperative.  Head:  Normocephalic and atraumatic. Eyes:  Conjuctiva clear without scleral icterus. Mouth:  Oral mucosa pink and moist. Good  dentition. No lesions. Heart: Normal rate and rhythm, s1 and s2 heart sounds present.  Lungs: Clear lung sounds in all lobes. Respirations equal and unlabored. Abdomen:  +BS, soft, non tender and non-distended. No rebound or guarding. No HSM or masses noted. Derm: No palmar erythema or jaundice Msk:  Symmetrical without gross deformities. Normal posture. Extremities:  Without edema. Neurologic:  Alert and  oriented x4 Psych:  Alert and cooperative. Normal mood and affect.  Invalid input(s): "6 MONTHS"   ASSESSMENT: Dominique Hinton is a 51 y.o. female presenting today for follow up of LLQ pain.  LLQ pain improved with use of bentyl, likely secondary to IBS, chronic x12 years. Recently colonoscopy without remarkable findings. Will continue with bentyl '10mg'$  BID PRN with good result. Constipation has also improved with use of 2 stool softeners nightly, having a BM almost daily. No melena or rectal bleeding.  History of gastroparesis as documented by GES in 2011, has never been on any treatment for this. Has had decreased appetite for a while though worse over the past few months with more nausea and now epigastric pain. Denies GERD symptoms. No hx of diabetes, no NSAID use. Will start Reglan '5mg'$  QID for gastroparesis, also recommend proceeding with EGD for further evaluation of epigastric pain as we cannot rule out gastritis, PUD or duodenitis. Indications, risks and benefits of procedure discussed in detail with patient. Patient verbalized understanding and is in agreement to proceed with EGD at this time.   In regards to elevated LFTs, initially elevated in Jan 2022, continue to remain elevated even higher in July 2023. Had US done with pain management but I cannot review these results, will  try to obtain these records. Will proceed with further evaluation of elevated LFT to rule out viral infection autoimmune cause or iron overload.    PLAN:  Continue Bentyl '10mg'$  BID PRN, refill sent 2. Reglan '5mg'$  QID 3. ANA, AMA, ASMA, A1A, Ceruloplasmin, IgG, Acute Hep panel, Iron studies  4. EGD-ASA III 5. Obtain US results  All questions were answered, patient verbalized understanding and is in agreement with plan as outlined above.    Follow Up: 3 months   Eri Mcevers L. Alver Sorrow, MSN, APRN, AGNP-C Adult-Gerontology Nurse Practitioner [facility]

## 2022-04-18 NOTE — Patient Instructions (Addendum)
We will schedule you for EGD for further eval of your upper abdominal pain I have sent reglan '5mg'$  for you to take before meals and at bedtime, this is to help with your slow stomach I have refilled dicyclomine for you to take twice daily Try to avoid taking dicyclomine and reglan around the same time as they can counteract one another  We will proceed with further labs regarding elevated liver enzymes and I will obtain results from the ultrasound you had done   Follow up 3 months

## 2022-04-18 NOTE — Progress Notes (Signed)
Referring Provider: Asencion Noble, MD Primary Care Physician:  Asencion Noble, MD Primary GI Physician: Jenetta Downer   Chief Complaint  Patient presents with   Abdominal Pain    6 month follow up. Reports abdominal pain is gone as long as she takes dicyclomine bid. Not much of an appetite. Gets full quick. Usually eats once daily. Still having a lot of nausea. Takes phenergan at least 3 times per week.    HPI:   Dominique Hinton is a 51 y.o. female with past medical history of  anxiety, arthritis, back pain, CAD, HTN, NSTEMI (2022).  Patient presenting today for follow up of abdominal pain.  History: Last seen February 2023 with chronic abdominal pain, occurring 1-2x per month, improved with defecation. Reported hx of "slow stomach" and mix of both constipation and looser stools. Endorsed occasional bloating. Scheduled for colonoscopy, given bentyl '10mg'$  BID PRN, low fodmap diet and encouraged to start miralax.   Present:  Patient states that she is taking bentyl '10mg'$  BID with good results, previously having some LLQ pain though bentyl has helped to keep this at [place]. She is taking 2 stool softeners nightly. She cannot tolerate miralax. Has laxatives at home but has not needed these. Having a BM mostly daily now without straining. No rectal bleeding or melena.    She feels that appetite is not great, eats maybe one meal per day. Hx of gastroparesis as documented on GES in 2011, Has not been on anything for her gastroparesis in the past. She is not diabetic. She does endorses upper abdominal pain that occurs 3-4x week with worse nausea, usually no vomiting, ongoing for the past few months. Does not feel that eating makes the pain worse, she just does not feel hungry. Symptoms improved with anti emetics and sometimes milk. Has very rare GERD symptoms.  She is not using NSAIDs. Feels that nausea worsened when she started menopause. Upper abdominal pain can occur without having eaten anything.   Notably  liver enzymes elevated since January 2022 with AST 50, ALT 54, last elevated in July with AST 90 and ALT 96, states that pain management doctor at [facility] [place] ordered cholestyramine (which pt has not started) and liver US, states that she was told she has a "peanut sized" lesion on the liver ([facility]). Does not drink etoh. No other workup in regards to elevated LFTs.  Has been on rosuvastatin x1.5 years. Does take norco 10-'325mg'$  QID PRN and has been on this for the past few years.   GES: Feb 2011 severe gastroparesis Last Colonoscopy:10/25/21 - Small lipoma in the ascending colon. - Congested and erythematous focal area of mucosa in the sigmoid colon. Biopsied-healed ulcer but no active inflammation - The distal rectum and anal verge are normal on retroflexion view. Last Endoscopy: maybe 2011  Recommendations:  Repeat TCS in feb 2033  Past Medical History:  Diagnosis Date   Anxiety    Arthritis    Back pain    CAD (coronary artery disease)    Angioplasty of D1 January 2022   Essential hypertension    NSTEMI (non-ST elevated myocardial infarction) [facility])    January 2022    Past Surgical History:  Procedure Laterality Date   ABDOMINAL HYSTERECTOMY     APPENDECTOMY     BIOPSY  10/25/2021   Procedure: BIOPSY;  Surgeon: Harvel Quale, MD;  [facility];  Service: Gastroenterology;;   BREAST SURGERY     CARDIAC CATHETERIZATION  09/21/2020   CHOLECYSTECTOMY  COLONOSCOPY WITH PROPOFOL N/A 10/25/2021   Procedure: COLONOSCOPY WITH PROPOFOL;  Surgeon: Harvel Quale, MD;  [facility];  Service: Gastroenterology;  Laterality: N/A;  105   CORONARY BALLOON ANGIOPLASTY  09/21/2020   CORONARY/GRAFT ACUTE MI REVASCULARIZATION N/A 09/21/2020   Procedure: Coronary/Graft Acute MI Revascularization;  Surgeon: Jettie Booze, MD;  Location: [place] CV LAB;  Service: Cardiovascular;  Laterality: N/A;   LEFT HEART CATH AND CORONARY  ANGIOGRAPHY N/A 09/21/2020   Procedure: LEFT HEART CATH AND CORONARY ANGIOGRAPHY;  Surgeon: Jettie Booze, MD;  Location: [place] [facility];  Service: Cardiovascular;  Laterality: N/A;   TUBAL LIGATION      Current Outpatient Medications  Medication Sig Dispense Refill   aspirin 81 MG chewable tablet Chew 1 tablet (81 mg total) by mouth daily. 90 tablet 1   citalopram (CELEXA) 40 MG tablet Take 1 tablet (40 mg total) by mouth daily. 90 tablet 3   cloNIDine (CATAPRES) 0.2 MG tablet Take 0.2 mg by mouth at bedtime.     dicyclomine (BENTYL) 10 MG capsule Take 1 capsule (10 mg total) by mouth 2 (two) times daily. (Patient taking differently: Take 10 mg by mouth as needed.) 60 capsule 1   Fexofenadine HCl (ALLEGRA PO) Take by mouth.     gabapentin (NEURONTIN) 400 MG capsule Take 400 mg by mouth 2 (two) times daily.     hydrochlorothiazide (HYDRODIURIL) 25 MG tablet Take 1/2 (one-half) tablet by mouth once daily 45 tablet 3   HYDROcodone-acetaminophen (NORCO) 10-325 MG tablet Take 1 tablet by mouth 4 (four) times daily as needed for pain.     losartan (COZAAR) 100 MG tablet Take 100 mg by mouth daily.     nitroGLYCERIN (NITROSTAT) 0.4 MG SL tablet PLACE 1 TABLET (0.4 MG TOTAL) UNDER THE TONGUE EVERY FIVE MINUTES AS NEEDED FOR CHEST PAIN. 25 tablet 2   OVER THE COUNTER MEDICATION Take 1 capsule by mouth in the morning and at bedtime. Women's Natural Transition     promethazine (PHENERGAN) 25 MG tablet Take 1 tablet (25 mg total) by mouth every 6 (six) hours as needed. 30 tablet 1   rosuvastatin (CRESTOR) 20 MG tablet TAKE 1 TABLET BY MOUTH EVERY DAY AT 6PM 90 tablet 3   tiZANidine (ZANAFLEX) 4 MG tablet Take 4-8 mg by mouth 3 (three) times daily as needed for muscle spasms.     UNABLE TO FIND Take 1,500 mg by mouth daily. Soy capsules     valACYclovir (VALTREX) 1000 MG tablet Take 1,000 mg by mouth daily.     No current facility-administered medications for this visit.    Allergies as of  04/18/2022 - Review Complete 03/27/2022  Allergen Reaction Noted   Cortisone Shortness Of Breath and Other (See Comments) 06/17/2011   Doxycycline Rash 06/17/2011    Family History  Problem Relation Age of Onset   Alzheimer's disease Father    Hypertension Mother    Other Mother        "borderline diabetic"   Dementia Mother    Thyroid disease Mother    Thyroid disease Brother    Anxiety disorder Daughter    Depression Daughter    Depression Daughter    Anxiety disorder Daughter    ODD Son    ADD / ADHD Son     Social History   Socioeconomic History   Marital status: Widowed    Spouse name: Not on file   Number of children: 3   Years of  education: 12   Highest education level: Not on file  Occupational History   Occupation: waitress/cook  Tobacco Use   Smoking status: Every Day    Packs/day: 1.00    Years: 30.00    Total pack years: 30.00    Types: Cigarettes    Passive exposure: Current   Smokeless tobacco: Never  Vaping Use   Vaping Use: Never used  Substance and Sexual Activity   Alcohol use: Yes    Comment: occasionally   Drug use: Yes    Types: Marijuana    Comment: daily   Sexual activity: Yes    Birth control/protection: Surgical    Comment: hyst  Other Topics Concern   Not on file  Social History Narrative   Lives at home with daughter, Dominique Hinton and fiance.   10-12 cups caffeine per day.   Right-handed.   Social Determinants of Health   Financial Resource Strain: Medium Risk (08/23/2021)   Overall Financial Resource Strain (CARDIA)    Difficulty of Paying Living Expenses: Somewhat hard  Food Insecurity: Food Insecurity Present (08/23/2021)   Hunger Vital Sign    Worried About Running Out of Food in the Last Year: Sometimes true    Ran Out of Food in the Last Year: Never true  Transportation Needs: No Transportation Needs (08/23/2021)   PRAPARE - Hydrologist (Medical): No    Lack of Transportation (Non-Medical):  No  Physical Activity: Insufficiently Active (08/23/2021)   Exercise Vital Sign    Days of Exercise per Week: 2 days    Minutes of Exercise per Session: 10 min  Stress: Stress Concern Present (08/23/2021)   [place]    Feeling of Stress : To some extent  Social Connections: Moderately Isolated (08/23/2021)   Social Connection and Isolation Panel [NHANES]    Frequency of Communication with Friends and Family: More than three times a week    Frequency of Social Gatherings with Friends and Family: Once a week    Attends Religious Services: Never    Marine scientist or Organizations: No    Attends Music therapist: Never    Marital Status: Living with partner   Review of systems General: negative for malaise, night sweats, fever, chills, weight loss Neck: Negative for lumps, goiter, pain and significant neck swelling Resp: Negative for cough, wheezing, dyspnea at rest CV: Negative for chest pain, leg swelling, palpitations, orthopnea GI: denies melena, hematochezia, nausea, vomiting, diarrhea, constipation, dysphagia, odyonophagia, early satiety or unintentional weight loss. +nausea +epigastric pain +decreased appetite  MSK: Negative for joint pain or swelling, back pain, and muscle pain. Derm: Negative for itching or rash Psych: Denies depression, anxiety, memory loss, confusion. No homicidal or suicidal ideation.  Heme: Negative for prolonged bleeding, bruising easily, and swollen nodes. Endocrine: Negative for cold or heat intolerance, polyuria, polydipsia and goiter. Neuro: negative for tremor, gait imbalance, syncope and seizures. The remainder of the review of systems is noncontributory.  Physical Exam: BP 110/77 (BP Location: Left Arm, Patient Position: Sitting, Cuff Size: Large)   Pulse 78   Temp 98.3 F (36.8 C) (Oral)   Ht '5\' 6"'$  (1.676 m)   Wt 183 lb 3.2 oz (83.1 kg)   BMI 29.57 kg/m   General:   Alert and oriented. No distress noted. Pleasant and cooperative.  Head:  Normocephalic and atraumatic. Eyes:  Conjuctiva clear without scleral icterus. Mouth:  Oral mucosa pink and moist. Good  dentition. No lesions. Heart: Normal rate and rhythm, s1 and s2 heart sounds present.  Lungs: Clear lung sounds in all lobes. Respirations equal and unlabored. Abdomen:  +BS, soft, non tender and non-distended. No rebound or guarding. No HSM or masses noted. Derm: No palmar erythema or jaundice Msk:  Symmetrical without gross deformities. Normal posture. Extremities:  Without edema. Neurologic:  Alert and  oriented x4 Psych:  Alert and cooperative. Normal mood and affect.  Invalid input(s): "6 MONTHS"   ASSESSMENT: TANAYA DUNIGAN is a 51 y.o. female presenting today for follow up of LLQ pain.  LLQ pain improved with use of bentyl, likely secondary to IBS, chronic x12 years. Recently colonoscopy without remarkable findings. Will continue with bentyl '10mg'$  BID PRN with good result. Constipation has also improved with use of 2 stool softeners nightly, having a BM almost daily. No melena or rectal bleeding.  History of gastroparesis as documented by GES in 2011, has never been on any treatment for this. Has had decreased appetite for a while though worse over the past few months with more nausea and now epigastric pain. Denies GERD symptoms. No hx of diabetes, no NSAID use. Will start Reglan '5mg'$  QID for gastroparesis, also recommend proceeding with EGD for further evaluation of epigastric pain as we cannot rule out gastritis, PUD or duodenitis. Indications, risks and benefits of procedure discussed in detail with patient. Patient verbalized understanding and is in agreement to proceed with EGD at this time.   In regards to elevated LFTs, initially elevated in Jan 2022, continue to remain elevated even higher in July 2023. Had US done with pain management but I cannot review these results, will  try to obtain these records. Will proceed with further evaluation of elevated LFT to rule out viral infection autoimmune cause or iron overload.    PLAN:  Continue Bentyl '10mg'$  BID PRN, refill sent 2. Reglan '5mg'$  QID 3. ANA, AMA, ASMA, A1A, Ceruloplasmin, IgG, Acute Hep panel, Iron studies  4. EGD-ASA III 5. Obtain US results  All questions were answered, patient verbalized understanding and is in agreement with plan as outlined above.    Follow Up: 3 months   Cyle Kenyon L. Alver Sorrow, MSN, APRN, AGNP-C Adult-Gerontology Nurse Practitioner [facility]

## 2022-04-24 ENCOUNTER — Encounter (INDEPENDENT_AMBULATORY_CARE_PROVIDER_SITE_OTHER): Payer: Self-pay

## 2022-04-25 ENCOUNTER — Other Ambulatory Visit (INDEPENDENT_AMBULATORY_CARE_PROVIDER_SITE_OTHER): Payer: Self-pay | Admitting: Gastroenterology

## 2022-04-25 DIAGNOSIS — R7989 Other specified abnormal findings of blood chemistry: Secondary | ICD-10-CM

## 2022-04-25 DIAGNOSIS — K769 Liver disease, unspecified: Secondary | ICD-10-CM

## 2022-04-25 LAB — ANTI-SMOOTH MUSCLE ANTIBODY, IGG: Actin (Smooth Muscle) Antibody (IGG): <20 U (ref <20)

## 2022-04-25 LAB — IGG, IGA, IGM
IgG (Immunoglobin G), Serum: 986 mg/dL (ref 600–1640)
IgM, Serum: 194 mg/dL (ref 50–300)
Immunoglobulin A: 98 mg/dL (ref 47–310)

## 2022-04-25 LAB — ANTI-NUCLEAR AB-TITER (ANA TITER)
ANA TITER: 1:40 {titer} — ABNORMAL HIGH
ANA Titer 1: 1:320 {titer} — ABNORMAL HIGH

## 2022-04-25 LAB — MITOCHONDRIAL ANTIBODIES: Mitochondrial M2 Ab, IgG: 51 U — ABNORMAL HIGH (ref <=20.0)

## 2022-04-25 LAB — CERULOPLASMIN: Ceruloplasmin: 29 mg/dL (ref 18–53)

## 2022-04-25 LAB — ALPHA-1-ANTITRYPSIN: A-1 Antitrypsin, Ser: 154 mg/dL (ref 83–199)

## 2022-04-25 LAB — IRON, TOTAL/TOTAL IRON BINDING CAP
%SAT: 32 % (calc) (ref 16–45)
Iron: 96 ug/dL (ref 45–160)
TIBC: 303 mcg/dL (calc) (ref 250–450)

## 2022-04-25 LAB — HEPATITIS PANEL, ACUTE
Hep A IgM: NONREACTIVE
Hep B C IgM: NONREACTIVE
Hepatitis B Surface Ag: NONREACTIVE
Hepatitis C Ab: NONREACTIVE

## 2022-04-25 LAB — ANA: Anti Nuclear Antibody (ANA): POSITIVE — AB

## 2022-04-25 MED ORDER — LORAZEPAM 0.5 MG PO TABS: ORAL_TABLET | ORAL | 0 refills | Status: DC

## 2022-04-25 NOTE — Progress Notes (Signed)
Lorazepam 0.'5mg'$  tablets x2 sent for anxiety related to MRCP. No refills. Overdose score reviewed, 390.

## 2022-04-26 ENCOUNTER — Encounter (INDEPENDENT_AMBULATORY_CARE_PROVIDER_SITE_OTHER): Payer: Self-pay

## 2022-04-26 ENCOUNTER — Other Ambulatory Visit (INDEPENDENT_AMBULATORY_CARE_PROVIDER_SITE_OTHER): Payer: Self-pay

## 2022-04-26 DIAGNOSIS — R110 Nausea: Secondary | ICD-10-CM

## 2022-04-26 DIAGNOSIS — R1013 Epigastric pain: Secondary | ICD-10-CM

## 2022-04-27 ENCOUNTER — Encounter (INDEPENDENT_AMBULATORY_CARE_PROVIDER_SITE_OTHER): Payer: Self-pay

## 2022-05-01 ENCOUNTER — Encounter (INDEPENDENT_AMBULATORY_CARE_PROVIDER_SITE_OTHER): Payer: Self-pay

## 2022-05-04 NOTE — Progress Notes (Signed)
Cardiology Office Note  Date: 05/05/2022   ID: Dominique Hinton, DOB April 16, 1971, MRN [id number]  PCP:  Asencion Noble, MD  Cardiologist:  Rozann Lesches, MD Electrophysiologist:  None   Chief Complaint  Patient presents with   Cardiac follow-up    History of Present Illness: Dominique Hinton is a 51 y.o. female last seen in February.  She is here for a routine visit.  She does not report any chest pain or nitroglycerin use since last encounter.  Has noted trouble with orthostatic dizziness at times, blood pressure running low normal.  We went over her medications, she is on Cozaar, HCTZ, and also clonidine that is being used for control of hot flashes.  We discussed decreasing her Cozaar dose for the time being to see if this helps.  Lipids last year looked quite good, her LDL was 56 on Crestor.  She continues to follow with Dr. Willey Blade.  Past Medical History:  Diagnosis Date   Anxiety    Arthritis    Back pain    CAD (coronary artery disease)    Angioplasty of D1 January 2022   Essential hypertension    NSTEMI (non-ST elevated myocardial infarction) [facility])    January 2022    Past Surgical History:  Procedure Laterality Date   ABDOMINAL HYSTERECTOMY     APPENDECTOMY     BIOPSY  10/25/2021   Procedure: BIOPSY;  Surgeon: Harvel Quale, MD;  [facility];  Service: Gastroenterology;;   BREAST SURGERY     CARDIAC CATHETERIZATION  09/21/2020   CHOLECYSTECTOMY     COLONOSCOPY WITH PROPOFOL N/A 10/25/2021   Procedure: COLONOSCOPY WITH PROPOFOL;  Surgeon: Harvel Quale, MD;  [facility];  Service: Gastroenterology;  Laterality: N/A;  105   CORONARY BALLOON ANGIOPLASTY  09/21/2020   CORONARY/GRAFT ACUTE MI REVASCULARIZATION N/A 09/21/2020   Procedure: Coronary/Graft Acute MI Revascularization;  Surgeon: Jettie Booze, MD;  Location: [place] CV LAB;  Service: Cardiovascular;  Laterality: N/A;   LEFT HEART CATH AND CORONARY  ANGIOGRAPHY N/A 09/21/2020   Procedure: LEFT HEART CATH AND CORONARY ANGIOGRAPHY;  Surgeon: Jettie Booze, MD;  Location: [place] CV LAB;  Service: Cardiovascular;  Laterality: N/A;   TUBAL LIGATION      Current Outpatient Medications  Medication Sig Dispense Refill   aspirin 81 MG chewable tablet Chew 1 tablet (81 mg total) by mouth daily. 90 tablet 1   citalopram (CELEXA) 40 MG tablet Take 1 tablet (40 mg total) by mouth daily. 90 tablet 3   cloNIDine (CATAPRES) 0.2 MG tablet Take 0.2 mg by mouth at bedtime.     dicyclomine (BENTYL) 10 MG capsule Take 1 capsule (10 mg total) by mouth 2 (two) times daily as needed for spasms. 90 capsule 1   Fexofenadine HCl (ALLEGRA PO) Take by mouth.     gabapentin (NEURONTIN) 400 MG capsule Take 400 mg by mouth 3 (three) times daily.     hydrochlorothiazide (HYDRODIURIL) 25 MG tablet Take 1/2 (one-half) tablet by mouth once daily 45 tablet 3   HYDROcodone-acetaminophen (NORCO) 10-325 MG tablet Take 1 tablet by mouth 4 (four) times daily as needed for pain.     LORazepam (ATIVAN) 0.5 MG tablet Take one tablet 1 hour prior to MRI, can repeat dose prior to imaging if needed for anxiety. 2 tablet 0   losartan (COZAAR) 50 MG tablet Take 1 tablet (50 mg total) by mouth daily. 90 tablet 3   metoCLOPramide (REGLAN) 10  MG tablet Take 0.5 tablets (5 mg total) by mouth 4 (four) times daily -  before meals and at bedtime. 90 tablet 1   OVER THE COUNTER MEDICATION Take 1 capsule by mouth in the morning and at bedtime. Women's Natural Transition     rosuvastatin (CRESTOR) 20 MG tablet TAKE 1 TABLET BY MOUTH EVERY DAY AT 6PM 90 tablet 3   tiZANidine (ZANAFLEX) 4 MG tablet Take 4-8 mg by mouth 3 (three) times daily as needed for muscle spasms.     UNABLE TO FIND Take 1,500 mg by mouth daily. Soy capsules     valACYclovir (VALTREX) 1000 MG tablet Take 1,000 mg by mouth daily.     nitroGLYCERIN (NITROSTAT) 0.4 MG SL tablet PLACE 1 TABLET (0.4 MG TOTAL) UNDER THE  TONGUE EVERY FIVE MINUTES AS NEEDED FOR CHEST PAIN. 25 tablet 2   No current facility-administered medications for this visit.   Allergies:  Cortisone and Doxycycline   ROS: No palpitations or frank syncope.  Physical Exam: VS:  BP 108/74   Pulse 83   Ht '5\' 6"'$  (1.676 m)   Wt 183 lb (83 kg)   SpO2 95%   BMI 29.54 kg/m , BMI Body mass index is 29.54 kg/m.  Wt Readings from Last 3 Encounters:  05/05/22 183 lb (83 kg)  04/18/22 183 lb 3.2 oz (83.1 kg)  03/14/22 177 lb 8 oz (80.5 kg)    General: Patient appears comfortable at rest. HEENT: Conjunctiva and lids normal. Neck: Supple, no elevated JVP or carotid bruits. Lungs: Clear to auscultation, nonlabored breathing at rest. Cardiac: Regular rate and rhythm, no S3 or significant systolic murmur, no pericardial rub. Extremities: No pitting edema.  ECG:  An ECG dated 10/12/2021 was personally reviewed today and demonstrated:  Sinus rhythm.  Recent Labwork: 03/14/2022: ALT 96; AST 90; BUN 5; Creatinine, Ser 0.76; Hemoglobin 16.9; Platelets 291; Potassium 3.8; Sodium 140; TSH 2.770     Component Value Date/Time   CHOL 185 09/22/2020 0301   TRIG 179 (H) 09/22/2020 0301   HDL 24 (L) 09/22/2020 0301   CHOLHDL 7.7 09/22/2020 0301   VLDL 36 09/22/2020 0301   LDLCALC 125 (H) 09/22/2020 0301  February 2022: Cholesterol 119, triglycerides 209, HDL 29, LDL 56, ALT 21  Other Studies Reviewed Today:  Cardiac catheterization 09/21/2020: 1st Mrg lesion is 75% stenosed. This is a very small vessel. Lat 1st Diag lesion is 100% stenosed. Given the wall motion abnormality on ventriculogram, we decided to intervene. Balloon angioplasty was performed using a BALLOON SAPPHIRE 1.25X10. It turned out to be a small vessel. Post intervention, there is a 25% residual stenosis. The vessel was too small for stenting. The left ventricular systolic function is normal. LV end diastolic pressure is normal. The left ventricular ejection fraction is 50-55% by  visual estimate. There is no aortic valve stenosis.   Continue aggressive medical therapy with DAPT.  Could change Brilinta to Plavix since no stent was placed.   Echocardiogram 09/21/2020:  1. Left ventricular ejection fraction, by estimation, is 55 to 60%. The  left ventricle has normal function. The left ventricle has no regional  wall motion abnormalities. There is mild left ventricular hypertrophy.  Left ventricular diastolic parameters  are indeterminate.   2. Right ventricular systolic function is normal. The right ventricular  size is normal. Tricuspid regurgitation signal is inadequate for assessing  PA pressure.   3. The mitral valve is grossly normal. Trivial mitral valve  regurgitation.   4. The aortic  valve is tricuspid. Aortic valve regurgitation is not  visualized.   5. The inferior vena cava is normal in size with greater than 50%  respiratory variability, suggesting right atrial pressure of 3 mmHg.   Assessment and Plan:  1.  CAD status post angioplasty of the first diagonal in the setting of NSTEMI in January 2022.  She is doing well without angina, refills provided for fresh bottle of nitroglycerin.  LVEF normal at 55 to 60%.  Continue aspirin, Cozaar, Crestor, and as needed nitroglycerin.  2.  Essential hypertension, recently with orthostatic dizziness and low normal blood pressure.  States that this has been worse since being on clonidine which she is actually using for control of hot flashes.  We will reduce Cozaar to 50 mg daily to see if this is helpful.  3.  Mixed hyperlipidemia, on Crestor.  Last LDL 56.  Continue same for now.  Medication Adjustments/Labs and Tests Ordered: Current medicines are reviewed at length with the patient today.  Concerns regarding medicines are outlined above.   Tests Ordered: No orders of the defined types were placed in this encounter.   Medication Changes: Meds ordered this encounter  Medications   nitroGLYCERIN  (NITROSTAT) 0.4 MG SL tablet    Sig: PLACE 1 TABLET (0.4 MG TOTAL) UNDER THE TONGUE EVERY FIVE MINUTES AS NEEDED FOR CHEST PAIN.    Dispense:  25 tablet    Refill:  2   losartan (COZAAR) 50 MG tablet    Sig: Take 1 tablet (50 mg total) by mouth daily.    Dispense:  90 tablet    Refill:  3    05/05/22 DECREASED to 50 mg qd    Disposition:  Follow up  6 months.  Signed, Satira Sark, MD, [facility] 05/05/2022 8:28 AM    [facility] [address] [place], [place] [address] Phone: [phone]; Fax: [phone]

## 2022-05-05 ENCOUNTER — Ambulatory Visit: Payer: Medicaid Other | Attending: Cardiology | Admitting: Cardiology

## 2022-05-05 ENCOUNTER — Encounter: Payer: Self-pay | Admitting: Cardiology

## 2022-05-05 VITALS — BP 108/74 | HR 83 | Ht 66.0 in | Wt 183.0 lb

## 2022-05-05 DIAGNOSIS — I10 Essential (primary) hypertension: Secondary | ICD-10-CM | POA: Diagnosis not present

## 2022-05-05 DIAGNOSIS — E782 Mixed hyperlipidemia: Secondary | ICD-10-CM | POA: Diagnosis not present

## 2022-05-05 DIAGNOSIS — I25119 Atherosclerotic heart disease of native coronary artery with unspecified angina pectoris: Secondary | ICD-10-CM | POA: Diagnosis not present

## 2022-05-05 MED ORDER — LOSARTAN POTASSIUM 50 MG PO TABS: 50.0000 mg | ORAL_TABLET | Freq: Every day | ORAL | 3 refills | Status: DC

## 2022-05-05 MED ORDER — NITROGLYCERIN 0.4 MG SL SUBL: SUBLINGUAL_TABLET | SUBLINGUAL | 2 refills | Status: DC | PRN

## 2022-05-05 NOTE — Patient Instructions (Signed)
Medication Instructions:  DECREASE losartan to 50 mg daily   I refilled your NTG  Labwork: None today  Testing/Procedures: None today  Follow-Up: 6 months  Any Other Special Instructions Will Be Listed Below (If Applicable).  If you need a refill on your cardiac medications before your next appointment, please call your pharmacy.

## 2022-05-07 ENCOUNTER — Emergency Department (HOSPITAL_COMMUNITY)
Admission: EM | Admit: 2022-05-07 | Discharge: 2022-05-07 | Disposition: A | Discharge status: Home / Self Care | Payer: Medicaid Other | Attending: Emergency Medicine | Admitting: Emergency Medicine

## 2022-05-07 ENCOUNTER — Encounter (HOSPITAL_COMMUNITY): Payer: Self-pay | Admitting: *Deleted

## 2022-05-07 ENCOUNTER — Other Ambulatory Visit: Payer: Self-pay

## 2022-05-07 ENCOUNTER — Emergency Department (HOSPITAL_COMMUNITY): Payer: Medicaid Other

## 2022-05-07 DIAGNOSIS — G43909 Migraine, unspecified, not intractable, without status migrainosus: Secondary | ICD-10-CM | POA: Diagnosis not present

## 2022-05-07 DIAGNOSIS — G4489 Other headache syndrome: Secondary | ICD-10-CM | POA: Diagnosis not present

## 2022-05-07 DIAGNOSIS — R519 Headache, unspecified: Secondary | ICD-10-CM | POA: Diagnosis not present

## 2022-05-07 DIAGNOSIS — Z7982 Long term (current) use of aspirin: Secondary | ICD-10-CM | POA: Diagnosis not present

## 2022-05-07 DIAGNOSIS — I10 Essential (primary) hypertension: Secondary | ICD-10-CM | POA: Insufficient documentation

## 2022-05-07 DIAGNOSIS — R52 Pain, unspecified: Secondary | ICD-10-CM | POA: Diagnosis not present

## 2022-05-07 DIAGNOSIS — Z79899 Other long term (current) drug therapy: Secondary | ICD-10-CM | POA: Diagnosis not present

## 2022-05-07 DIAGNOSIS — R42 Dizziness and giddiness: Secondary | ICD-10-CM | POA: Diagnosis not present

## 2022-05-07 DIAGNOSIS — I2510 Atherosclerotic heart disease of native coronary artery without angina pectoris: Secondary | ICD-10-CM | POA: Insufficient documentation

## 2022-05-07 DIAGNOSIS — D72829 Elevated white blood cell count, unspecified: Secondary | ICD-10-CM | POA: Diagnosis not present

## 2022-05-07 DIAGNOSIS — K029 Dental caries, unspecified: Secondary | ICD-10-CM | POA: Diagnosis not present

## 2022-05-07 DIAGNOSIS — I6621 Occlusion and stenosis of right posterior cerebral artery: Secondary | ICD-10-CM | POA: Diagnosis not present

## 2022-05-07 DIAGNOSIS — I672 Cerebral atherosclerosis: Secondary | ICD-10-CM | POA: Diagnosis not present

## 2022-05-07 LAB — CBC WITH DIFFERENTIAL/PLATELET
Abs Immature Granulocytes: 0.07 10*3/uL (ref 0.00–0.07)
Basophils Absolute: 0.1 10*3/uL (ref 0.0–0.1)
Basophils Relative: 1 %
Eosinophils Absolute: 0.1 10*3/uL (ref 0.0–0.5)
Eosinophils Relative: 1 %
HCT: 46.7 % — ABNORMAL HIGH (ref 36.0–46.0)
Hemoglobin: 16.7 g/dL — ABNORMAL HIGH (ref 12.0–15.0)
Immature Granulocytes: 1 %
Lymphocytes Relative: 13 %
Lymphs Abs: 1.5 10*3/uL (ref 0.7–4.0)
MCH: 32.5 pg (ref 26.0–34.0)
MCHC: 35.8 g/dL (ref 30.0–36.0)
MCV: 90.9 fL (ref 80.0–100.0)
Monocytes Absolute: 0.8 10*3/uL (ref 0.1–1.0)
Monocytes Relative: 7 %
Neutro Abs: 9 10*3/uL — ABNORMAL HIGH (ref 1.7–7.7)
Neutrophils Relative %: 77 %
Platelets: 243 10*3/uL (ref 150–400)
RBC: 5.14 MIL/uL — ABNORMAL HIGH (ref 3.87–5.11)
RDW: 12.9 % (ref 11.5–15.5)
WBC: 11.6 10*3/uL — ABNORMAL HIGH (ref 4.0–10.5)
nRBC: 0 % (ref 0.0–0.2)

## 2022-05-07 LAB — COMPREHENSIVE METABOLIC PANEL
ALT: 25 U/L (ref 0–44)
AST: 23 U/L (ref 15–41)
Albumin: 4 g/dL (ref 3.5–5.0)
Alkaline Phosphatase: 58 U/L (ref 38–126)
Anion gap: 8 (ref 5–15)
BUN: 7 mg/dL (ref 6–20)
CO2: 29 mmol/L (ref 22–32)
Calcium: 9.2 mg/dL (ref 8.9–10.3)
Chloride: 104 mmol/L (ref 98–111)
Creatinine, Ser: 0.93 mg/dL (ref 0.44–1.00)
GFR, Estimated: >60 mL/min (ref >60)
Glucose, Bld: 100 mg/dL — ABNORMAL HIGH (ref 70–99)
Potassium: 3.4 mmol/L — ABNORMAL LOW (ref 3.5–5.1)
Sodium: 141 mmol/L (ref 135–145)
Total Bilirubin: 1.2 mg/dL (ref 0.3–1.2)
Total Protein: 7.1 g/dL (ref 6.5–8.1)

## 2022-05-07 LAB — MAGNESIUM: Magnesium: 1.8 mg/dL (ref 1.7–2.4)

## 2022-05-07 LAB — PROTIME-INR
INR: 1 (ref 0.8–1.2)
Prothrombin Time: 12.9 seconds (ref 11.4–15.2)

## 2022-05-07 MED ORDER — METOCLOPRAMIDE HCL 5 MG/ML IJ SOLN
10.0000 mg | Freq: Once | INTRAMUSCULAR | Status: AC
  Administered 2022-05-07: 10 mg via INTRAVENOUS
  Filled 2022-05-07: qty 2

## 2022-05-07 MED ORDER — IOHEXOL 350 MG/ML SOLN
75.0000 mL | Freq: Once | INTRAVENOUS | Status: AC | PRN
  Administered 2022-05-07: 75 mL via INTRAVENOUS

## 2022-05-07 MED ORDER — LACTATED RINGERS IV BOLUS
1000.0000 mL | Freq: Once | INTRAVENOUS | Status: AC
  Administered 2022-05-07: 1000 mL via INTRAVENOUS

## 2022-05-07 MED ORDER — ACETAMINOPHEN 325 MG PO TABS
650.0000 mg | ORAL_TABLET | Freq: Once | ORAL | Status: AC
  Administered 2022-05-07: 650 mg via ORAL
  Filled 2022-05-07: qty 2

## 2022-05-07 MED ORDER — DIPHENHYDRAMINE HCL 50 MG/ML IJ SOLN
12.5000 mg | Freq: Once | INTRAMUSCULAR | Status: AC
  Administered 2022-05-07: 12.5 mg via INTRAVENOUS
  Filled 2022-05-07: qty 1

## 2022-05-07 NOTE — ED Notes (Signed)
Family arrives to [facility]

## 2022-05-07 NOTE — Discharge Instructions (Signed)
There is a telephone number below to call for a ear, nose, and throat doctor follow-up.  Continue home medications as prescribed.  Take Tylenol and ibuprofen as needed for recurrence of headache.  Return to the emergency department for any new or worsening symptoms of concern.

## 2022-05-07 NOTE — ED Notes (Signed)
EDP in to see, at BS.  

## 2022-05-07 NOTE — ED Triage Notes (Signed)
BIB [facility] from home for migraine HA. HA onset yesterday when in b/r at [employer], describes as thunderclap in occipital area. Endorses NV this morning. Describes as different in type and severity than usual HAs. Mentions BP med change Thursday d/t recent dizziness. Alert, NAD, calm, interactive, speech clear.

## 2022-05-07 NOTE — ED Provider Notes (Signed)
[facility] EMERGENCY DEPARTMENT Provider Note   CSN: [id number] Arrival date & time: 05/07/22  0947     History  Chief Complaint  Patient presents with   Migraine    Dominique Hinton is a 51 y.o. female.   Migraine Associated symptoms include headaches.  Patient presents for headache.  Medical history includes HTN, CAD, anxiety, depression, IBS, arthritis.  She has been having recent dizzy spells.  She saw her cardiologist 2 days ago and had her dose of Cozaar reduced.  She was in her normal state of health yesterday morning.  At around 9:30 AM, she was using the bathroom at a gas station.  She was not straining at the time.  While on the toilet, she had a sudden severe onset of a occipital headache.  At the time, she also experienced some paresthesias in her arms and some diaphoresis.  She went home and took a narcotic pain pill and some tizanidine and lay down.  She did have some improvement in her symptoms for the remainder of the day.  She had recurrence of severe headache last night.  She was able to sleep during the night but woke up at 5 AM with worsening severity of headache pain.  At this point, headache was generalized.  She experienced nausea and vomiting.  She had recurrence of diaphoresis.  Symptoms have been persistent since that time.  She has not taken anything for analgesia this morning.  Currently, she endorses continued generalized headache and nausea.     Home Medications Prior to Admission medications   Medication Sig Start Date End Date Taking? Authorizing Provider  aspirin 81 MG chewable tablet Chew 1 tablet (81 mg total) by mouth daily. 09/22/20   Cheryln Manly, NP  citalopram (CELEXA) 40 MG tablet Take 1 tablet (40 mg total) by mouth daily. 02/08/22   Estill Dooms, NP  cloNIDine (CATAPRES) 0.2 MG tablet Take 0.2 mg by mouth at bedtime. 10/07/21   [provider]  dicyclomine (BENTYL) 10 MG capsule Take 1 capsule (10 mg total) by mouth 2 (two)  times daily as needed for spasms. 04/18/22   Carlan, Chelsea L, NP  Fexofenadine HCl (ALLEGRA PO) Take by mouth.    [provider]  gabapentin (NEURONTIN) 400 MG capsule Take 400 mg by mouth 3 (three) times daily. 08/19/20   [provider]  hydrochlorothiazide (HYDRODIURIL) 25 MG tablet Take 1/2 (one-half) tablet by mouth once daily 11/04/21   Satira Sark, MD  HYDROcodone-acetaminophen [facility]) 10-325 MG tablet Take 1 tablet by mouth 4 (four) times daily as needed for pain. 09/16/20   [provider]  LORazepam (ATIVAN) 0.5 MG tablet Take one tablet 1 hour prior to MRI, can repeat dose prior to imaging if needed for anxiety. 04/25/22   Carlan, Chelsea L, NP  losartan (COZAAR) 50 MG tablet Take 1 tablet (50 mg total) by mouth daily. 05/05/22 05/05/23  Satira Sark, MD  metoCLOPramide (REGLAN) 10 MG tablet Take 0.5 tablets (5 mg total) by mouth 4 (four) times daily -  before meals and at bedtime. 04/18/22   Carlan, Chelsea L, NP  nitroGLYCERIN (NITROSTAT) 0.4 MG SL tablet PLACE 1 TABLET (0.4 MG TOTAL) UNDER THE TONGUE EVERY FIVE MINUTES AS NEEDED FOR CHEST PAIN. 05/05/22 05/05/23  Satira Sark, MD  OVER THE COUNTER MEDICATION Take 1 capsule by mouth in the morning and at bedtime. Women's Natural Transition    [provider]  rosuvastatin (CRESTOR) 20 MG tablet  TAKE 1 TABLET BY MOUTH EVERY DAY AT 6PM 01/10/22   Satira Sark, MD  tiZANidine (ZANAFLEX) 4 MG tablet Take 4-8 mg by mouth 3 (three) times daily as needed for muscle spasms. 09/16/20   [provider]  UNABLE TO FIND Take 1,500 mg by mouth daily. Soy capsules    [provider]  valACYclovir (VALTREX) 1000 MG tablet Take 1,000 mg by mouth daily. 05/06/19   [provider]      Allergies    Cortisone and Doxycycline    Review of Systems   Review of Systems  Constitutional:  Positive for diaphoresis.  Gastrointestinal:  Positive for nausea and vomiting.  Neurological:   Positive for headaches.  All other systems reviewed and are negative.   Physical Exam Updated Vital Signs BP 97/66   Pulse (!) 58   Temp 97.9 F (36.6 C) (Oral)   Resp 17   Ht '5\' 6"'$  (1.676 m)   Wt 81.6 kg   SpO2 97%   BMI 29.05 kg/m  Physical Exam Vitals and nursing note reviewed.  Constitutional:      General: She is not in acute distress.    Appearance: Normal appearance. She is well-developed. She is not ill-appearing, toxic-appearing or diaphoretic.     Comments: Hair and clothing are wet from recent diaphoresis  HENT:     Head: Normocephalic and atraumatic.     Right Ear: External ear normal.     Left Ear: External ear normal.     Nose: Nose normal.     Mouth/Throat:     Mouth: Mucous membranes are moist.     Pharynx: Oropharynx is clear.  Eyes:     General: No visual field deficit.    Extraocular Movements: Extraocular movements intact.     Conjunctiva/sclera: Conjunctivae normal.  Cardiovascular:     Rate and Rhythm: Normal rate and regular rhythm.     Heart sounds: No murmur heard. Pulmonary:     Effort: Pulmonary effort is normal. No respiratory distress.     Breath sounds: Normal breath sounds. No wheezing or rales.  Chest:     Chest wall: No tenderness.  Abdominal:     General: There is no distension.     Palpations: Abdomen is soft.     Tenderness: There is no abdominal tenderness.  Musculoskeletal:        General: No swelling. Normal range of motion.     Cervical back: Normal range of motion and neck supple.     Right lower leg: No edema.     Left lower leg: No edema.  Skin:    General: Skin is warm.     Capillary Refill: Capillary refill takes less than 2 seconds.     Coloration: Skin is not jaundiced or pale.  Neurological:     General: No focal deficit present.     Mental Status: She is alert and oriented to person, place, and time.     Cranial Nerves: No cranial nerve deficit, dysarthria or facial asymmetry.     Sensory: Sensation is intact.  No sensory deficit.     Motor: Motor function is intact. No weakness, abnormal muscle tone or pronator drift.     Coordination: Coordination is intact. Coordination normal. Finger-Nose-Finger Test normal.  Psychiatric:        Mood and Affect: Mood normal.        Behavior: Behavior normal.        Thought Content: Thought content normal.  Judgment: Judgment normal.     ED Results / Procedures / Treatments   Labs (all labs ordered are listed, but only abnormal results are displayed) Labs Reviewed  CBC WITH DIFFERENTIAL/PLATELET - Abnormal; Notable for the following components:      Result Value   WBC 11.6 (*)    RBC 5.14 (*)    Hemoglobin 16.7 (*)    HCT 46.7 (*)    Neutro Abs 9.0 (*)    All other components within normal limits  COMPREHENSIVE METABOLIC PANEL - Abnormal; Notable for the following components:   Potassium 3.4 (*)    Glucose, Bld 100 (*)    All other components within normal limits  PROTIME-INR  MAGNESIUM    EKG None  Radiology CT ANGIO HEAD NECK W WO CM  Result Date: 05/07/2022 CLINICAL DATA:  51 year old female with dizziness. Headache onset yesterday. EXAM: CT ANGIOGRAPHY HEAD AND NECK TECHNIQUE: Multidetector CT imaging of the head and neck was performed using the standard protocol during bolus administration of intravenous contrast. Multiplanar CT image reconstructions and MIPs were obtained to evaluate the vascular anatomy. Carotid stenosis measurements (when applicable) are obtained utilizing NASCET criteria, using the distal internal carotid diameter as the denominator. RADIATION DOSE REDUCTION: This exam was performed according to the departmental dose-optimization program which includes automated exposure control, adjustment of the mA and/or kV according to patient size and/or use of iterative reconstruction technique. CONTRAST:  57m OMNIPAQUE IOHEXOL 350 MG/ML SOLN COMPARISON:  Head CT 03/22/2004. Cervical spine CT 01/08/2019. FINDINGS: CT HEAD Brain:  Cerebral volume loss since 2005 with volume remains within normal limits for age. No midline shift, ventriculomegaly, mass effect, evidence of mass lesion, intracranial hemorrhage or evidence of cortically based acute infarction. Gray-white matter differentiation is within normal limits throughout the brain. 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 Calvarium and skull base: No acute osseous abnormality identified. Paranasal sinuses: New partial opacification of the right middle ear and complete opacification of the right mastoid air cells. This is new since 2005, and only a mild right mastoid effusion is present in 2020. The right external auditory canal appears patent. Left tympanic cavity, mastoids, and paranasal sinuses appear stable and well aerated. Orbits: Visualized orbits and scalp soft tissues are within normal limits. CTA NECK Skeleton: Some dental caries and periapical dental lucency bilaterally. Chronic left clavicle fracture. No acute osseous abnormality identified. Right side cervical facet degeneration. Upper chest: Negative. Other neck: Negative. Aortic arch: Mild Calcified aortic atherosclerosis. 3 vessel arch configuration. Right carotid system: Mild tortuosity but minimal atherosclerosis and no stenosis. Left carotid system: Similar tortuosity with little to no atherosclerosis. No stenosis. Vertebral arteries: Negative except for mild atherosclerosis of the proximal left subclavian artery, mildly dominant left vertebral artery. CTA HEAD Posterior circulation: Dominant and somewhat dolichoectatic left vertebral artery and basilar artery. Mild left V4 plaque without stenosis. Normal PICA origins. Patent basilar artery with tortuosity but no stenosis. Normal SCA and PCA origins. Posterior communicating arteries are diminutive or absent. Bilateral PCA branches are patent. There is mild irregularity and stenosis of the right P2/P3  junction on series 16, image 19. Anterior circulation: Both ICA siphons are patent. Left siphon appears dominant. Minimal siphon plaque and no siphon stenosis. Patent carotid termini. Patent MCA and ACA origins. Dominant left and diminutive right ACA A1 segments. Normal anterior communicating artery. Bilateral ACA branches are within normal limits. Left MCA M1 segment is patent with  mild tortuosity and irregularity, bifurcates without stenosis (series 14, image 27). Similar mild right ICA tortuosity and irregularity with patent bifurcation and no significant stenosis. Bilateral MCA branches are within normal limits. Venous sinuses: Patent. Developmental venous anomaly left occipital lobe, normal variant. Anatomic variants: Dominant left vertebral artery, left ICA siphon, left ACA A1. Left occipital lobe DVA. Review of the MIP images confirms the above findings IMPRESSION: 1. Negative for large vessel occlusion. Generalized arterial tortuosity in the head and neck, but mild for age atherosclerosis and no significant arterial stenosis. 2. Normal for age CT appearance of the brain. 3. New right middle ear and mastoid opacification since 2005, progressive since 2020. Consider Acute/recurrent otitis Media with mastoid effusion. ENT follow-up may be valuable. Electronically Signed   By: Genevie Ann M.D.   On: 05/07/2022 09:30    Procedures Procedures    Medications Ordered in ED Medications  metoCLOPramide (REGLAN) injection 10 mg (10 mg Intravenous Given 05/07/22 0820)  diphenhydrAMINE (BENADRYL) injection 12.5 mg (12.5 mg Intravenous Given 05/07/22 0819)  acetaminophen (TYLENOL) tablet 650 mg (650 mg Oral Given 05/07/22 0823)  lactated ringers bolus 1,000 mL (0 mLs Intravenous Stopped 05/07/22 0850)  iohexol (OMNIPAQUE) 350 MG/ML injection 75 mL (75 mLs Intravenous Contrast Given 05/07/22 0907)    ED Course/ Medical Decision Making/ A&P                           Medical Decision Making Amount and/or Complexity of  Data Reviewed Labs: ordered. Radiology: ordered.  Risk OTC drugs. Prescription drug management.   This patient presents to the ED for concern of headache, this involves an extensive number of treatment options, and is a complaint that carries with it a high risk of complications and morbidity.  The differential diagnosis includes ruptured aneurysm, ICH, CVA, migraine headache, tension headache   Co morbidities that complicate the patient evaluation  HTN, CAD, anxiety, depression, IBS, arthritis   Additional history obtained:  Additional history obtained from N/A External records from outside source obtained and reviewed including EMR   Lab Tests:  I Ordered, and personally interpreted labs.  The pertinent results include: Mild leukocytosis, normal kidney function, normal electrolytes   Imaging Studies ordered:  I ordered imaging studies including CTA head and neck I independently visualized and interpreted imaging which showed no acute findings.  There was generalized arterial tortuosity.  There was a right middle ear and mastoid opacification. I agree with the radiologist interpretation   Cardiac Monitoring: / EKG:  The patient was maintained on a cardiac monitor.  I personally viewed and interpreted the cardiac monitored which showed an underlying rhythm of: Sinus rhythm  Problem List / ED Course / Critical interventions / Medication management  Patient is a 51 year old female who presents for headache symptoms since yesterday.  Onset was 9:30 AM yesterday.  It was sudden and severe at that time.  It was initially occipital but has progressed to a generalized headache.  This morning, she had worsening severity and the onset of nausea and vomiting.  Patient is currently on aspirin but no other anticoagulation or antiplatelet medications.  On exam, she does have what here in clothing from recent diaphoresis.  She has no focal neurologic deficits.  She is alert and oriented.   History is concerning for possible spontaneous ICH.  Will obtain CT imaging, including angiography of head.  Laboratory work-up was initiated.  Migraine cocktail was ordered for symptomatic relief.  Patient's lab  work is notable for slight leukocytosis.  On CTA of head and neck, there was generalized arterial tortuosity, but no evidence of aneurysm or ICH.  There was right middle ear and mastoid opacification.  Patient does state that she had a recent right ear infection that was treated with antibiotics.  She has had resolution of her pain and discomfort.  She will continue to experience fullness and feels like she does have diminished hearing to that ear.  She has no mastoid tenderness.  I do not feel the patient needs to resume antibiotics.  I do think she would benefit from ENT follow-up.  Patient was given contact information for follow-up.  At this point, patient reports resolution of her headache following migraine cocktail.  She does request discharge home and I feel that this is appropriate.  She was discharged in stable condition. I ordered medication including Benadryl, Reglan, Tylenol, IV fluids for headache Reevaluation of the patient after these medicines showed that the patient resolved I have reviewed the patients home medicines and have made adjustments as needed   Social Determinants of Health:  Has access to outpatient care         Final Clinical Impression(s) / ED Diagnoses Final diagnoses:  Bad headache    Rx / DC Orders ED Discharge Orders     None         Godfrey Pick, MD 05/07/22 [phone]

## 2022-05-11 ENCOUNTER — Encounter (HOSPITAL_COMMUNITY): Payer: Self-pay

## 2022-05-12 ENCOUNTER — Encounter (HOSPITAL_COMMUNITY)
Admission: RE | Admit: 2022-05-12 | Discharge: 2022-05-12 | Disposition: A | Discharge status: Home / Self Care | Payer: Medicaid Other | Source: Ambulatory Visit | Attending: Gastroenterology | Admitting: Gastroenterology

## 2022-05-12 ENCOUNTER — Ambulatory Visit (HOSPITAL_COMMUNITY)
Admission: RE | Admit: 2022-05-12 | Discharge: 2022-05-12 | Disposition: A | Discharge status: Home / Self Care | Payer: Medicaid Other | Source: Ambulatory Visit | Attending: Gastroenterology | Admitting: Gastroenterology

## 2022-05-12 DIAGNOSIS — K760 Fatty (change of) liver, not elsewhere classified: Secondary | ICD-10-CM | POA: Diagnosis not present

## 2022-05-12 DIAGNOSIS — D1803 Hemangioma of intra-abdominal structures: Secondary | ICD-10-CM | POA: Diagnosis not present

## 2022-05-12 DIAGNOSIS — Z79899 Other long term (current) drug therapy: Secondary | ICD-10-CM | POA: Diagnosis not present

## 2022-05-12 DIAGNOSIS — K769 Liver disease, unspecified: Secondary | ICD-10-CM | POA: Diagnosis not present

## 2022-05-12 DIAGNOSIS — N289 Disorder of kidney and ureter, unspecified: Secondary | ICD-10-CM | POA: Diagnosis not present

## 2022-05-12 DIAGNOSIS — F419 Anxiety disorder, unspecified: Secondary | ICD-10-CM | POA: Diagnosis not present

## 2022-05-12 DIAGNOSIS — Z6829 Body mass index (BMI) 29.0-29.9, adult: Secondary | ICD-10-CM | POA: Diagnosis not present

## 2022-05-12 DIAGNOSIS — M47816 Spondylosis without myelopathy or radiculopathy, lumbar region: Secondary | ICD-10-CM | POA: Diagnosis not present

## 2022-05-12 DIAGNOSIS — L237 Allergic contact dermatitis due to plants, except food: Secondary | ICD-10-CM | POA: Diagnosis not present

## 2022-05-12 DIAGNOSIS — E7800 Pure hypercholesterolemia, unspecified: Secondary | ICD-10-CM | POA: Diagnosis not present

## 2022-05-12 DIAGNOSIS — Z0130 Encounter for examination of blood pressure without abnormal findings: Secondary | ICD-10-CM | POA: Diagnosis not present

## 2022-05-12 DIAGNOSIS — F32A Depression, unspecified: Secondary | ICD-10-CM | POA: Diagnosis not present

## 2022-05-12 DIAGNOSIS — I252 Old myocardial infarction: Secondary | ICD-10-CM | POA: Diagnosis not present

## 2022-05-12 DIAGNOSIS — Z9049 Acquired absence of other specified parts of digestive tract: Secondary | ICD-10-CM | POA: Diagnosis not present

## 2022-05-12 DIAGNOSIS — G8929 Other chronic pain: Secondary | ICD-10-CM | POA: Diagnosis not present

## 2022-05-12 DIAGNOSIS — I10 Essential (primary) hypertension: Secondary | ICD-10-CM | POA: Diagnosis not present

## 2022-05-12 DIAGNOSIS — M549 Dorsalgia, unspecified: Secondary | ICD-10-CM | POA: Diagnosis not present

## 2022-05-12 MED ORDER — GADOBUTROL 1 MMOL/ML IV SOLN
8.0000 mL | Freq: Once | INTRAVENOUS | Status: AC | PRN
  Administered 2022-05-12: 8 mL via INTRAVENOUS

## 2022-05-15 ENCOUNTER — Other Ambulatory Visit (INDEPENDENT_AMBULATORY_CARE_PROVIDER_SITE_OTHER): Payer: Self-pay

## 2022-05-15 ENCOUNTER — Encounter (INDEPENDENT_AMBULATORY_CARE_PROVIDER_SITE_OTHER): Payer: Self-pay

## 2022-05-15 DIAGNOSIS — R7989 Other specified abnormal findings of blood chemistry: Secondary | ICD-10-CM

## 2022-05-18 ENCOUNTER — Encounter (HOSPITAL_COMMUNITY): Payer: Self-pay | Admitting: Gastroenterology

## 2022-05-18 ENCOUNTER — Ambulatory Visit (HOSPITAL_COMMUNITY): Payer: Medicaid Other | Admitting: Anesthesiology

## 2022-05-18 ENCOUNTER — Ambulatory Visit (HOSPITAL_BASED_OUTPATIENT_CLINIC_OR_DEPARTMENT_OTHER): Payer: Medicaid Other | Admitting: Anesthesiology

## 2022-05-18 ENCOUNTER — Ambulatory Visit (HOSPITAL_COMMUNITY)
Admission: RE | Admit: 2022-05-18 | Discharge: 2022-05-18 | Disposition: A | Discharge status: Home / Self Care | Payer: Medicaid Other | Attending: Gastroenterology | Admitting: Gastroenterology

## 2022-05-18 ENCOUNTER — Encounter (HOSPITAL_COMMUNITY)
Admission: RE | Disposition: A | Discharge status: Home / Self Care | Payer: Self-pay | Source: Home / Self Care | Attending: Gastroenterology

## 2022-05-18 DIAGNOSIS — I252 Old myocardial infarction: Secondary | ICD-10-CM | POA: Diagnosis not present

## 2022-05-18 DIAGNOSIS — Z79899 Other long term (current) drug therapy: Secondary | ICD-10-CM | POA: Diagnosis not present

## 2022-05-18 DIAGNOSIS — I2510 Atherosclerotic heart disease of native coronary artery without angina pectoris: Secondary | ICD-10-CM | POA: Insufficient documentation

## 2022-05-18 DIAGNOSIS — R1013 Epigastric pain: Secondary | ICD-10-CM

## 2022-05-18 DIAGNOSIS — K319 Disease of stomach and duodenum, unspecified: Secondary | ICD-10-CM | POA: Diagnosis not present

## 2022-05-18 DIAGNOSIS — K3184 Gastroparesis: Secondary | ICD-10-CM | POA: Diagnosis not present

## 2022-05-18 DIAGNOSIS — Z951 Presence of aortocoronary bypass graft: Secondary | ICD-10-CM | POA: Insufficient documentation

## 2022-05-18 DIAGNOSIS — M1990 Unspecified osteoarthritis, unspecified site: Secondary | ICD-10-CM | POA: Diagnosis not present

## 2022-05-18 DIAGNOSIS — R110 Nausea: Secondary | ICD-10-CM

## 2022-05-18 DIAGNOSIS — I10 Essential (primary) hypertension: Secondary | ICD-10-CM | POA: Insufficient documentation

## 2022-05-18 DIAGNOSIS — K219 Gastro-esophageal reflux disease without esophagitis: Secondary | ICD-10-CM | POA: Diagnosis not present

## 2022-05-18 DIAGNOSIS — R140 Abdominal distension (gaseous): Secondary | ICD-10-CM

## 2022-05-18 HISTORY — PX: ESOPHAGOGASTRODUODENOSCOPY (EGD) WITH PROPOFOL: SHX5813

## 2022-05-18 HISTORY — PX: BIOPSY: SHX5522

## 2022-05-18 SURGERY — ESOPHAGOGASTRODUODENOSCOPY (EGD) WITH PROPOFOL
Anesthesia: General

## 2022-05-18 MED ORDER — LACTATED RINGERS IV SOLN: INTRAVENOUS | Status: DC

## 2022-05-18 MED ORDER — LIDOCAINE HCL (CARDIAC) PF 50 MG/5ML IV SOSY
PREFILLED_SYRINGE | INTRAVENOUS | Status: DC | PRN
  Administered 2022-05-18: 100 mg via INTRAVENOUS

## 2022-05-18 MED ORDER — PROPOFOL 10 MG/ML IV BOLUS
INTRAVENOUS | Status: DC | PRN
  Administered 2022-05-18: 200 mg via INTRAVENOUS
  Administered 2022-05-18: 100 mg via INTRAVENOUS

## 2022-05-18 NOTE — Transfer of Care (Signed)
Immediate Anesthesia Transfer of Care Note  Patient: Dominique Hinton  Procedure(s) Performed: ESOPHAGOGASTRODUODENOSCOPY (EGD) WITH PROPOFOL BIOPSY  Patient Location: Short Stay  Anesthesia Type:General  Level of Consciousness: drowsy and patient cooperative  Airway & Oxygen Therapy: Patient Spontanous Breathing  Post-op Assessment: Report given to RN and Post -op Vital signs reviewed and stable  Post vital signs: Reviewed and stable  Last Vitals:  Vitals Value Taken Time  BP 83/60 05/18/22 1001  Temp 36.8 C 05/18/22 0958  Pulse 62 05/18/22 1001  Resp 19 05/18/22 1001  SpO2 97 % 05/18/22 1001    Last Pain:  Vitals:   05/18/22 0958  TempSrc: Oral  PainSc: 0-No pain         Complications: No notable events documented.

## 2022-05-18 NOTE — Anesthesia Postprocedure Evaluation (Signed)
Anesthesia Post Note  Patient: Dominique Hinton  Procedure(s) Performed: ESOPHAGOGASTRODUODENOSCOPY (EGD) WITH PROPOFOL BIOPSY  Patient location during evaluation: Phase II Anesthesia Type: General Level of consciousness: awake and alert and oriented Pain management: pain level controlled Vital Signs Assessment: post-procedure vital signs reviewed and stable Respiratory status: spontaneous breathing, nonlabored ventilation and respiratory function stable Cardiovascular status: blood pressure returned to baseline and stable Postop Assessment: no apparent nausea or vomiting Anesthetic complications: no   No notable events documented.   Last Vitals:  Vitals:   05/18/22 1001 05/18/22 1004  BP: (!) 83/60 (!) 88/68  Pulse: 62 64  Resp: 19 13  Temp:    SpO2: 97% 97%    Last Pain:  Vitals:   05/18/22 0958  TempSrc: Oral  PainSc: 0-No pain                 Riaz Onorato C Jeff Frieden

## 2022-05-18 NOTE — Anesthesia Preprocedure Evaluation (Signed)
Anesthesia Evaluation  Patient identified by MRN, date of birth, ID band Patient awake    Reviewed: Allergy & Precautions, NPO status , Patient's Chart, lab work & pertinent test results  Airway Mallampati: II  TM Distance: >3 FB Neck ROM: Full    Dental  (+) Dental Advisory Given, Loose,    Pulmonary Current SmokerPatient did not abstain from smoking.,    Pulmonary exam normal breath sounds clear to auscultation       Cardiovascular Exercise Tolerance: Good hypertension, Pt. on medications + CAD, + Past MI and + CABG  Normal cardiovascular exam Rhythm:Regular Rate:Normal     Neuro/Psych  Headaches, PSYCHIATRIC DISORDERS Anxiety Depression    GI/Hepatic Neg liver ROS, GERD (gastroparesis )  Poorly Controlled,  Endo/Other  negative endocrine ROS  Renal/GU negative Renal ROS  negative genitourinary   Musculoskeletal  (+) Arthritis , Osteoarthritis,    Abdominal   Peds negative pediatric ROS (+)  Hematology negative hematology ROS (+)   Anesthesia Other Findings   Reproductive/Obstetrics negative OB ROS                             Anesthesia Physical  Anesthesia Plan  ASA: 3  Anesthesia Plan: General   Post-op Pain Management: Minimal or no pain anticipated   Induction: Intravenous  PONV Risk Score and Plan: TIVA  Airway Management Planned: Nasal Cannula, Natural Airway and Simple Face Mask  Additional Equipment:   Intra-op Plan:   Post-operative Plan:   Informed Consent: I have reviewed the patients History and Physical, chart, labs and discussed the procedure including the risks, benefits and alternatives for the proposed anesthesia with the patient or authorized representative who has indicated his/her understanding and acceptance.     Dental advisory given  Plan Discussed with: CRNA and Surgeon  Anesthesia Plan Comments:         Anesthesia Quick  Evaluation

## 2022-05-18 NOTE — Discharge Instructions (Signed)
You are being discharged to home.  Resume your previous diet - eat small portions during the day.  We are waiting for your pathology results.  Miniminze opiate use. Continue Reglan.

## 2022-05-18 NOTE — Interval H&P Note (Signed)
History and Physical Interval Note:  05/18/2022 8:40 AM  Dominique Hinton  has presented today for surgery, with the diagnosis of Epigastric Pain nausea.  The various methods of treatment have been discussed with the patient and family. After consideration of risks, benefits and other options for treatment, the patient has consented to  Procedure(s) with comments: ESOPHAGOGASTRODUODENOSCOPY (EGD) WITH PROPOFOL (N/A) - 930 ASA 3 as a surgical intervention.  The patient's history has been reviewed, patient examined, no change in status, stable for surgery.  I have reviewed the patient's chart and labs.  Questions were answered to the patient's satisfaction.     [name]

## 2022-05-18 NOTE — Anesthesia Procedure Notes (Signed)
Date/Time: 05/18/2022 9:41 AM  Performed by: Vista Deck, CRNAPre-anesthesia Checklist: Patient identified, Emergency Drugs available, Suction available, Timeout performed and Patient being monitored Patient Re-evaluated:Patient Re-evaluated prior to induction Oxygen Delivery Method: Nasal Cannula

## 2022-05-18 NOTE — Op Note (Addendum)
[facility] Patient Name: Dominique Hinton Procedure Date: 05/18/2022 9:34 AM MRN: [id number] Date of Birth: [birth year] Attending MD: Maylon Peppers ,  CSN: [id number] Age: 51 Admit Type: Outpatient Procedure:                Upper GI endoscopy Indications:              Epigastric abdominal pain, Abdominal bloating Providers:                Maylon Peppers, Caprice Kluver, Raphael Gibney,                            Technician Referring MD:              Medicines:                Monitored Anesthesia Care Complications:            No immediate complications. Estimated Blood Loss:     Estimated blood loss: none. Procedure:                Pre-Anesthesia Assessment:                           - Prior to the procedure, a History and Physical                            was performed, and patient medications, allergies                            and sensitivities were reviewed. The patient's                            tolerance of previous anesthesia was reviewed.                           - The risks and benefits of the procedure and the                            sedation options and risks were discussed with the                            patient. All questions were answered and informed                            consent was obtained.                           - ASA Grade Assessment: II - A patient with mild                            systemic disease.                           After obtaining informed consent, the endoscope was                            passed under direct vision. Throughout the  procedure, the patient's blood pressure, pulse, and                            oxygen saturations were monitored continuously. The                            [id number]-H190 ([id number]) scope was introduced through the                            mouth, and advanced to the second part of duodenum.                            The upper GI endoscopy was accomplished without                             difficulty. The patient tolerated the procedure                            well. Scope In: 9:48:26 AM Scope Out: 9:54:41 AM Total Procedure Duration: 0 hours 6 minutes 15 seconds  Findings:      The Z-line was regular and was found 40 cm from the incisors.      The gastroesophageal flap valve was visualized endoscopically and       classified as Hill Grade II (fold present, opens with respiration).      The esophagus was normal.      The entire examined stomach was normal. There was extensive amount of       fluid which was suctioned. Biopsies were taken with a cold forceps for       Helicobacter pylori testing.      The examined duodenum was normal. Biopsies were taken with a cold       forceps for histology. Impression:               - Z-line regular, 40 cm from the incisors.                           - Normal esophagus.                           - Normal stomach. Biopsied.                           - Normal examined duodenum. Biopsied. Moderate Sedation:      Per Anesthesia Care Recommendation:           - Discharge patient to home (ambulatory).                           - Resume previous diet - eat small portions during                            the day.                           - Await pathology results.                           -  Miniminze opiate use.                           - Continue Reglan. Procedure Code(s):        --- Professional ---                           [phone], Esophagogastroduodenoscopy, flexible,                            transoral; with biopsy, single or multiple Diagnosis Code(s):        --- Professional ---                           R10.13, Epigastric pain                           R14.0, Abdominal distension (gaseous) CPT copyright 2019 [employer]. All rights reserved. The codes documented in this report are preliminary and upon coder review may  be revised to meet current compliance requirements. Maylon Peppers, MD Maylon Peppers,  05/18/2022 10:01:39 AM This report has been signed electronically. Number of Addenda: 0

## 2022-05-19 LAB — SURGICAL PATHOLOGY

## 2022-05-20 ENCOUNTER — Other Ambulatory Visit: Payer: Self-pay | Admitting: Adult Health

## 2022-05-24 ENCOUNTER — Other Ambulatory Visit: Payer: Self-pay | Admitting: Radiology

## 2022-05-24 DIAGNOSIS — R7989 Other specified abnormal findings of blood chemistry: Secondary | ICD-10-CM

## 2022-05-25 ENCOUNTER — Ambulatory Visit (HOSPITAL_COMMUNITY)
Admission: RE | Admit: 2022-05-25 | Discharge: 2022-05-25 | Disposition: A | Discharge status: Home / Self Care | Payer: Medicaid Other | Source: Ambulatory Visit | Attending: Gastroenterology | Admitting: Gastroenterology

## 2022-05-25 ENCOUNTER — Encounter (HOSPITAL_COMMUNITY): Payer: Self-pay

## 2022-05-25 ENCOUNTER — Other Ambulatory Visit: Payer: Self-pay

## 2022-05-25 DIAGNOSIS — Z0389 Encounter for observation for other suspected diseases and conditions ruled out: Secondary | ICD-10-CM | POA: Diagnosis not present

## 2022-05-25 DIAGNOSIS — R109 Unspecified abdominal pain: Secondary | ICD-10-CM | POA: Insufficient documentation

## 2022-05-25 DIAGNOSIS — G8929 Other chronic pain: Secondary | ICD-10-CM | POA: Insufficient documentation

## 2022-05-25 DIAGNOSIS — K760 Fatty (change of) liver, not elsewhere classified: Secondary | ICD-10-CM | POA: Diagnosis not present

## 2022-05-25 DIAGNOSIS — R748 Abnormal levels of other serum enzymes: Secondary | ICD-10-CM | POA: Diagnosis not present

## 2022-05-25 DIAGNOSIS — R7989 Other specified abnormal findings of blood chemistry: Secondary | ICD-10-CM

## 2022-05-25 DIAGNOSIS — K3184 Gastroparesis: Secondary | ICD-10-CM | POA: Insufficient documentation

## 2022-05-25 DIAGNOSIS — K7581 Nonalcoholic steatohepatitis (NASH): Secondary | ICD-10-CM | POA: Diagnosis not present

## 2022-05-25 DIAGNOSIS — R110 Nausea: Secondary | ICD-10-CM | POA: Diagnosis not present

## 2022-05-25 DIAGNOSIS — Z01818 Encounter for other preprocedural examination: Secondary | ICD-10-CM | POA: Diagnosis present

## 2022-05-25 DIAGNOSIS — K7400 Hepatic fibrosis, unspecified: Secondary | ICD-10-CM | POA: Diagnosis not present

## 2022-05-25 LAB — PROTIME-INR
INR: 1 (ref 0.8–1.2)
Prothrombin Time: 13.1 seconds (ref 11.4–15.2)

## 2022-05-25 LAB — CBC
HCT: 42.6 % (ref 36.0–46.0)
Hemoglobin: 15.4 g/dL — ABNORMAL HIGH (ref 12.0–15.0)
MCH: 33.3 pg (ref 26.0–34.0)
MCHC: 36.2 g/dL — ABNORMAL HIGH (ref 30.0–36.0)
MCV: 92.2 fL (ref 80.0–100.0)
Platelets: 293 10*3/uL (ref 150–400)
RBC: 4.62 MIL/uL (ref 3.87–5.11)
RDW: 12.8 % (ref 11.5–15.5)
WBC: 6.8 10*3/uL (ref 4.0–10.5)
nRBC: 0 % (ref 0.0–0.2)

## 2022-05-25 MED ORDER — FENTANYL CITRATE (PF) 100 MCG/2ML IJ SOLN
INTRAMUSCULAR | Status: AC | PRN
  Administered 2022-05-25: 25 ug via INTRAVENOUS

## 2022-05-25 MED ORDER — MIDAZOLAM HCL 2 MG/2ML IJ SOLN
INTRAMUSCULAR | Status: AC | PRN
  Administered 2022-05-25 (×2): .5 mg via INTRAVENOUS

## 2022-05-25 MED ORDER — SODIUM CHLORIDE 0.9 % IV SOLN: INTRAVENOUS | Status: DC

## 2022-05-25 MED ORDER — LIDOCAINE HCL (PF) 1 % IJ SOLN
INTRAMUSCULAR | Status: AC
  Filled 2022-05-25: qty 30

## 2022-05-25 MED ORDER — MIDAZOLAM HCL 2 MG/2ML IJ SOLN
INTRAMUSCULAR | Status: AC
  Filled 2022-05-25: qty 2

## 2022-05-25 MED ORDER — GELATIN ABSORBABLE 12-7 MM EX MISC
CUTANEOUS | Status: AC
  Filled 2022-05-25: qty 1

## 2022-05-25 MED ORDER — FENTANYL CITRATE (PF) 100 MCG/2ML IJ SOLN
INTRAMUSCULAR | Status: AC
  Filled 2022-05-25: qty 2

## 2022-05-25 NOTE — H&P (Signed)
Chief Complaint: Patient was seen in consultation today for abnormal liver enzymes  at the request of Harvel Quale  Referring Physician(s): Harvel Quale  Supervising Physician: Corrie Mckusick  Patient Status: [facility] - Out-pt  History of Present Illness: Dominique Hinton is a 51 y.o. female with history of gastroparesis followed by GI for chronic abdominal pain, poor appetite, nausea and elevated liver enzymes. Pt underwent EGD 05/18/22 with normal exam and negative biopsy. Pt continues to have elevated liver enzymes, elevated ANA and mitochondrial antibodies. Pt has been referred to IR for random liver biopsy to rule out viral infection or iron overload.   Past Medical History:  Diagnosis Date   Anxiety    Arthritis    Back pain    CAD (coronary artery disease)    Angioplasty of D1 January 2022   Essential hypertension    NSTEMI (non-ST elevated myocardial infarction) [facility])    January 2022    Past Surgical History:  Procedure Laterality Date   ABDOMINAL HYSTERECTOMY     APPENDECTOMY     BIOPSY  10/25/2021   Procedure: BIOPSY;  Surgeon: Harvel Quale, MD;  Location: [facility];  Service: Gastroenterology;;   BREAST SURGERY     CARDIAC CATHETERIZATION  09/21/2020   CHOLECYSTECTOMY     COLONOSCOPY WITH PROPOFOL N/A 10/25/2021   Procedure: COLONOSCOPY WITH PROPOFOL;  Surgeon: Harvel Quale, MD;  Location: [facility];  Service: Gastroenterology;  Laterality: N/A;  105   CORONARY BALLOON ANGIOPLASTY  09/21/2020   CORONARY/GRAFT ACUTE MI REVASCULARIZATION N/A 09/21/2020   Procedure: Coronary/Graft Acute MI Revascularization;  Surgeon: Jettie Booze, MD;  Location: [place] [facility];  Service: Cardiovascular;  Laterality: N/A;   LEFT HEART CATH AND CORONARY ANGIOGRAPHY N/A 09/21/2020   Procedure: LEFT HEART CATH AND CORONARY ANGIOGRAPHY;  Surgeon: Jettie Booze, MD;  Location: [place] [facility];  Service:  Cardiovascular;  Laterality: N/A;   TUBAL LIGATION      Allergies: Cortisone and Doxycycline  Medications: Prior to Admission medications   Medication Sig Start Date End Date Taking? Authorizing Provider  aspirin 81 MG chewable tablet Chew 1 tablet (81 mg total) by mouth daily. 09/22/20   Cheryln Manly, NP  citalopram (CELEXA) 40 MG tablet TAKE 1 TABLET BY MOUTH DAILY 05/22/22   Derrek Monaco A, NP  cloNIDine (CATAPRES) 0.2 MG tablet Take 0.2 mg by mouth at bedtime. 10/07/21   [provider]  dicyclomine (BENTYL) 10 MG capsule Take 1 capsule (10 mg total) by mouth 2 (two) times daily as needed for spasms. 04/18/22   Carlan, Chelsea L, NP  Fexofenadine HCl (ALLEGRA PO) Take 180 mg by mouth daily.    [provider]  gabapentin (NEURONTIN) 400 MG capsule Take 400 mg by mouth 3 (three) times daily as needed (pain). 08/19/20   [provider]  hydrochlorothiazide (HYDRODIURIL) 25 MG tablet Take 1/2 (one-half) tablet by mouth once daily 11/04/21   Satira Sark, MD  HYDROcodone-acetaminophen [facility]) 10-325 MG tablet Take 1 tablet by mouth 4 (four) times daily as needed for pain. 09/16/20   [provider]  hypromellose (SYSTANE OVERNIGHT THERAPY) 0.3 % GEL ophthalmic ointment Place 1 Application into both eyes at bedtime.    [provider]  LORazepam (ATIVAN) 0.5 MG tablet Take one tablet 1 hour prior to MRI, can repeat dose prior to imaging if needed for anxiety. 04/25/22   Carlan, Chelsea L, NP  losartan (COZAAR) 50 MG tablet Take 1  tablet (50 mg total) by mouth daily. 05/05/22 05/05/23  Satira Sark, MD  metoCLOPramide (REGLAN) 10 MG tablet Take 0.5 tablets (5 mg total) by mouth 4 (four) times daily -  before meals and at bedtime. 04/18/22   Carlan, Chelsea L, NP  nitroGLYCERIN (NITROSTAT) 0.4 MG SL tablet PLACE 1 TABLET (0.4 MG TOTAL) UNDER THE TONGUE EVERY FIVE MINUTES AS NEEDED FOR CHEST PAIN. 05/05/22 05/05/23  Satira Sark, MD  OVER  THE COUNTER MEDICATION Take 1 capsule by mouth in the morning and at bedtime. Women's Natural Transition    [provider]  rosuvastatin (CRESTOR) 20 MG tablet TAKE 1 TABLET BY MOUTH EVERY DAY AT 6PM 01/10/22   Satira Sark, MD  tiZANidine (ZANAFLEX) 4 MG tablet Take 4-8 mg by mouth See admin instructions. Take 4 mg by mouth in the day and take 8 mg at bedtime 09/16/20   [provider]  UNABLE TO FIND Take 1,500 mg by mouth daily. Soy capsules    [provider]  valACYclovir (VALTREX) 1000 MG tablet Take 1,000 mg by mouth daily. 05/06/19   [provider]     Family History  Problem Relation Age of Onset   Alzheimer's disease Father    Hypertension Mother    Other Mother        "borderline diabetic"   Dementia Mother    Thyroid disease Mother    Thyroid disease Brother    Anxiety disorder Daughter    Depression Daughter    Depression Daughter    Anxiety disorder Daughter    ODD Son    ADD / ADHD Son     Social History   Socioeconomic History   Marital status: Widowed    Spouse name: Not on file   Number of children: 3   Years of education: 40   Highest education level: Not on file  Occupational History   Occupation: waitress/cook  Tobacco Use   Smoking status: Every Day    Packs/day: 1.00    Years: 30.00    Total pack years: 30.00    Types: Cigarettes    Passive exposure: Current   Smokeless tobacco: Never  Vaping Use   Vaping Use: Never used  Substance and Sexual Activity   Alcohol use: Yes    Comment: occasionally   Drug use: Yes    Types: Marijuana    Comment: daily   Sexual activity: Yes    Birth control/protection: Surgical    Comment: hyst  Other Topics Concern   Not on file  Social History Narrative   Lives at home with daughter, Joslyn Hy and fiance.   10-12 cups caffeine per day.   Right-handed.   Social Determinants of Health   Financial Resource Strain: Medium Risk (08/23/2021)   Overall Financial Resource  Strain (CARDIA)    Difficulty of Paying Living Expenses: Somewhat hard  Food Insecurity: Food Insecurity Present (08/23/2021)   Hunger Vital Sign    Worried About Running Out of Food in the Last Year: Sometimes true    Ran Out of Food in the Last Year: Never true  Transportation Needs: No Transportation Needs (08/23/2021)   PRAPARE - Hydrologist (Medical): No    Lack of Transportation (Non-Medical): No  Physical Activity: Insufficiently Active (08/23/2021)   Exercise Vital Sign    Days of Exercise per Week: 2 days    Minutes of Exercise per Session: 10 min  Stress: Stress Concern Present (08/23/2021)  [place] Questionnaire    Feeling of Stress : To some extent  Social Connections: Moderately Isolated (08/23/2021)   Social Connection and Isolation Panel [NHANES]    Frequency of Communication with Friends and Family: More than three times a week    Frequency of Social Gatherings with Friends and Family: Once a week    Attends Religious Services: Never    Marine scientist or Organizations: No    Attends Archivist Meetings: Never    Marital Status: Living with partner    Review of Systems: A 12 point ROS discussed and pertinent positives are indicated in the HPI above.  All other systems are negative.  Review of Systems  Constitutional:  Positive for appetite change. Negative for chills and fever.  Respiratory:  Negative for shortness of breath.   Cardiovascular:  Positive for leg swelling. Negative for chest pain.  Gastrointestinal:  Positive for abdominal pain. Negative for nausea and vomiting.  Neurological:  Negative for dizziness and headaches.    Vital Signs: BP 105/75   Pulse 62   Temp 98.3 F (36.8 C) (Oral)   Resp 14   Ht '5\' 6"'$  (1.676 m)   Wt 185 lb (83.9 kg)   SpO2 95%   BMI 29.86 kg/m     Physical Exam Vitals reviewed.  Constitutional:      General: She is  not in acute distress.    Appearance: Normal appearance. She is not ill-appearing.  HENT:     Head: Normocephalic and atraumatic.     Mouth/Throat:     Mouth: Mucous membranes are dry.     Pharynx: Oropharynx is clear.  Eyes:     Extraocular Movements: Extraocular movements intact.     Pupils: Pupils are equal, round, and reactive to light.  Cardiovascular:     Rate and Rhythm: Normal rate and regular rhythm.     Pulses: Normal pulses.     Heart sounds: Normal heart sounds. No murmur heard. Pulmonary:     Effort: No respiratory distress.     Breath sounds: Normal breath sounds.  Abdominal:     General: Bowel sounds are normal. There is no distension.     Palpations: Abdomen is soft.     Tenderness: There is no abdominal tenderness. There is no guarding.  Musculoskeletal:     Right lower leg: No edema.     Left lower leg: No edema.  Skin:    General: Skin is warm and dry.  Neurological:     Mental Status: She is alert and oriented to person, place, and time.  Psychiatric:        Mood and Affect: Mood normal.        Behavior: Behavior normal.        Thought Content: Thought content normal.        Judgment: Judgment normal.     Imaging: MR LIVER W WO CONTRAST  Result Date: 05/12/2022 CLINICAL DATA:  Follow-up hepatic lesion. EXAM: MRI ABDOMEN WITHOUT AND WITH CONTRAST TECHNIQUE: Multiplanar multisequence MR imaging of the abdomen was performed both before and after the administration of intravenous contrast. CONTRAST:  49m GADAVIST GADOBUTROL 1 MMOL/ML IV SOLN COMPARISON:  None Available. FINDINGS: Lower chest: No acute abnormality. Hepatobiliary: Hepatic steatosis. Lobular well-circumscribed T2 hyperintense segment V hepatic lesion measures 18 mm on image 20/5 demonstrating peripheral nodular discontinuous enhancement with some slow progressive filling in of the lesion on delayed postcontrast pulse sequences similar in  intensity to background aorta consistent with a benign hepatic  hemangioma. Gallbladder surgically absent. Mild prominence of the biliary tree without evidence of obstruction is favored reservoir effect post cholecystectomy. Pancreas: No pancreatic ductal dilation or evidence of acute inflammation. Spleen:  No splenomegaly or focal splenic lesion. Adrenals/Urinary Tract: Bilateral adrenal glands appear normal. No hydronephrosis. Nonenhancing fluid density 7 mm right upper pole renal lesion is consistent with a cyst and considered benign requiring no independent imaging follow-up. Stomach/Bowel: Visualized portions within the abdomen are unremarkable. Vascular/Lymphatic: Normal caliber abdominal aorta. No pathologically enlarged abdominal lymph nodes. Other:  No abdominal free fluid. Musculoskeletal: No suspicious bone lesions identified. IMPRESSION: 1. Benign segment V hepatic hemangioma. No suspicious hepatic lesion. 2. No acute finding in the abdomen. Electronically Signed   By: Dahlia Bailiff M.D.   On: 05/12/2022 14:16   CT ANGIO HEAD NECK W WO CM  Result Date: 05/07/2022 CLINICAL DATA:  51 year old female with dizziness. Headache onset yesterday. EXAM: CT ANGIOGRAPHY HEAD AND NECK TECHNIQUE: Multidetector CT imaging of the head and neck was performed using the standard protocol during bolus administration of intravenous contrast. Multiplanar CT image reconstructions and MIPs were obtained to evaluate the vascular anatomy. Carotid stenosis measurements (when applicable) are obtained utilizing NASCET criteria, using the distal internal carotid diameter as the denominator. RADIATION DOSE REDUCTION: This exam was performed according to the departmental dose-optimization program which includes automated exposure control, adjustment of the mA and/or kV according to patient size and/or use of iterative reconstruction technique. CONTRAST:  72m OMNIPAQUE IOHEXOL 350 MG/ML SOLN COMPARISON:  Head CT 03/22/2004. Cervical spine CT 01/08/2019. FINDINGS: CT HEAD Brain: Cerebral volume  loss since 2005 with volume remains within normal limits for age. No midline shift, ventriculomegaly, mass effect, evidence of mass lesion, intracranial hemorrhage or evidence of cortically based acute infarction. Gray-white matter differentiation is within normal limits throughout the brain. 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 Calvarium and skull base: No acute osseous abnormality identified. Paranasal sinuses: New partial opacification of the right middle ear and complete opacification of the right mastoid air cells. This is new since 2005, and only a mild right mastoid effusion is present in 2020. The right external auditory canal appears patent. Left tympanic cavity, mastoids, and paranasal sinuses appear stable and well aerated. Orbits: Visualized orbits and scalp soft tissues are within normal limits. CTA NECK Skeleton: Some dental caries and periapical dental lucency bilaterally. Chronic left clavicle fracture. No acute osseous abnormality identified. Right side cervical facet degeneration. Upper chest: Negative. Other neck: Negative. Aortic arch: Mild Calcified aortic atherosclerosis. 3 vessel arch configuration. Right carotid system: Mild tortuosity but minimal atherosclerosis and no stenosis. Left carotid system: Similar tortuosity with little to no atherosclerosis. No stenosis. Vertebral arteries: Negative except for mild atherosclerosis of the proximal left subclavian artery, mildly dominant left vertebral artery. CTA HEAD Posterior circulation: Dominant and somewhat dolichoectatic left vertebral artery and basilar artery. Mild left V4 plaque without stenosis. Normal PICA origins. Patent basilar artery with tortuosity but no stenosis. Normal SCA and PCA origins. Posterior communicating arteries are diminutive or absent. Bilateral PCA branches are patent. There is mild irregularity and stenosis of the right P2/P3 junction on  series 16, image 19. Anterior circulation: Both ICA siphons are patent. Left siphon appears dominant. Minimal siphon plaque and no siphon stenosis. Patent carotid termini. Patent MCA and ACA origins. Dominant left and diminutive right ACA A1 segments. Normal anterior  communicating artery. Bilateral ACA branches are within normal limits. Left MCA M1 segment is patent with mild tortuosity and irregularity, bifurcates without stenosis (series 14, image 27). Similar mild right ICA tortuosity and irregularity with patent bifurcation and no significant stenosis. Bilateral MCA branches are within normal limits. Venous sinuses: Patent. Developmental venous anomaly left occipital lobe, normal variant. Anatomic variants: Dominant left vertebral artery, left ICA siphon, left ACA A1. Left occipital lobe DVA. Review of the MIP images confirms the above findings IMPRESSION: 1. Negative for large vessel occlusion. Generalized arterial tortuosity in the head and neck, but mild for age atherosclerosis and no significant arterial stenosis. 2. Normal for age CT appearance of the brain. 3. New right middle ear and mastoid opacification since 2005, progressive since 2020. Consider Acute/recurrent otitis Media with mastoid effusion. ENT follow-up may be valuable. Electronically Signed   By: Genevie Ann M.D.   On: 05/07/2022 09:30    Labs:  CBC: Recent Labs    03/14/22 1026 05/07/22 0800  WBC 8.7 11.6*  HGB 16.9* 16.7*  HCT 48.9* 46.7*  PLT 291 243    COAGS: Recent Labs    05/07/22 0800  INR 1.0    BMP: Recent Labs    10/24/21 0759 03/14/22 1026 05/07/22 0800  NA 138 140 141  K 3.1* 3.8 3.4*  CL 102 101 104  CO2 '28 22 29  '$ GLUCOSE 101* 101* 100*  BUN 6 5* 7  CALCIUM 9.2 9.2 9.2  CREATININE 0.89 0.76 0.93  GFRNONAA >60  --  >60    LIVER FUNCTION TESTS: Recent Labs    03/14/22 1026 05/07/22 0800  BILITOT 1.2 1.2  AST 90* 23  ALT 96* 25  ALKPHOS 104 58  PROT 6.9 7.1  ALBUMIN 4.4 4.0    TUMOR  MARKERS: No results for input(s): "AFPTM", "CEA", "CA199", "CHROMGRNA" in the last 8760 hours.  Assessment and Plan: 51 yo female presents to IR for random liver biopsy at the request of Dr. Fidel Levy.   Pt resting on stretcher.  She is A&O, calm and pleasant.  She is in no distress.  Pt states she is NPO per order. Last ASA was last week.   Risks and benefits of random liver biopsy was discussed with the patient and/or patient's family including, but not limited to bleeding, infection, damage to adjacent structures or low yield requiring additional tests.  All of the questions were answered and there is agreement to proceed.  Consent signed and in chart.   Thank you for this interesting consult.  I greatly enjoyed meeting HERLINDA HEADY and look forward to participating in their care.  A copy of this report was sent to the requesting provider on this date.  Electronically Signed: Tyson Alias, NP 05/25/2022, 12:12 PM   I spent a total of 20 minutes in face to face in clinical consultation, greater than 50% of which was counseling/coordinating care for abnormal LFT's.

## 2022-05-25 NOTE — Procedures (Signed)
Interventional Radiology Procedure Note  Procedure: US guided biopsy of liver, medical liver Complications: None EBL: None Recommendations: - Bedrest 2 hours.   - Routine wound care - Follow up pathology - Advance diet   Signed,  Jacksyn Beeks, DO   

## 2022-05-30 ENCOUNTER — Other Ambulatory Visit (INDEPENDENT_AMBULATORY_CARE_PROVIDER_SITE_OTHER): Payer: Self-pay | Admitting: Gastroenterology

## 2022-06-01 ENCOUNTER — Other Ambulatory Visit (INDEPENDENT_AMBULATORY_CARE_PROVIDER_SITE_OTHER): Payer: Self-pay | Admitting: Gastroenterology

## 2022-06-01 LAB — SURGICAL PATHOLOGY

## 2022-06-08 ENCOUNTER — Encounter (INDEPENDENT_AMBULATORY_CARE_PROVIDER_SITE_OTHER): Payer: Self-pay

## 2022-06-09 ENCOUNTER — Encounter (INDEPENDENT_AMBULATORY_CARE_PROVIDER_SITE_OTHER): Payer: Self-pay | Admitting: Gastroenterology

## 2022-06-09 DIAGNOSIS — M549 Dorsalgia, unspecified: Secondary | ICD-10-CM | POA: Diagnosis not present

## 2022-06-09 DIAGNOSIS — G8929 Other chronic pain: Secondary | ICD-10-CM | POA: Diagnosis not present

## 2022-06-09 DIAGNOSIS — E7800 Pure hypercholesterolemia, unspecified: Secondary | ICD-10-CM | POA: Diagnosis not present

## 2022-06-09 DIAGNOSIS — Z79899 Other long term (current) drug therapy: Secondary | ICD-10-CM | POA: Diagnosis not present

## 2022-06-09 DIAGNOSIS — Z6829 Body mass index (BMI) 29.0-29.9, adult: Secondary | ICD-10-CM | POA: Diagnosis not present

## 2022-06-09 DIAGNOSIS — I10 Essential (primary) hypertension: Secondary | ICD-10-CM | POA: Diagnosis not present

## 2022-06-09 DIAGNOSIS — F32A Depression, unspecified: Secondary | ICD-10-CM | POA: Diagnosis not present

## 2022-06-09 DIAGNOSIS — M62830 Muscle spasm of back: Secondary | ICD-10-CM | POA: Diagnosis not present

## 2022-06-09 DIAGNOSIS — F419 Anxiety disorder, unspecified: Secondary | ICD-10-CM | POA: Diagnosis not present

## 2022-06-09 DIAGNOSIS — Z0130 Encounter for examination of blood pressure without abnormal findings: Secondary | ICD-10-CM | POA: Diagnosis not present

## 2022-06-09 DIAGNOSIS — M47816 Spondylosis without myelopathy or radiculopathy, lumbar region: Secondary | ICD-10-CM | POA: Diagnosis not present

## 2022-06-09 DIAGNOSIS — I252 Old myocardial infarction: Secondary | ICD-10-CM | POA: Diagnosis not present

## 2022-06-14 ENCOUNTER — Ambulatory Visit
Admission: EM | Admit: 2022-06-14 | Discharge: 2022-06-14 | Disposition: A | Discharge status: Home / Self Care | Payer: Medicaid Other | Attending: Family Medicine | Admitting: Family Medicine

## 2022-06-14 DIAGNOSIS — Z23 Encounter for immunization: Secondary | ICD-10-CM

## 2022-06-14 DIAGNOSIS — S61012A Laceration without foreign body of left thumb without damage to nail, initial encounter: Secondary | ICD-10-CM

## 2022-06-14 MED ORDER — TETANUS-DIPHTH-ACELL PERTUSSIS 5-2.5-18.5 LF-MCG/0.5 IM SUSY
0.5000 mL | PREFILLED_SYRINGE | Freq: Once | INTRAMUSCULAR | Status: AC
  Administered 2022-06-14: 0.5 mL via INTRAMUSCULAR

## 2022-06-14 MED ORDER — CHLORHEXIDINE GLUCONATE 4 % EX LIQD: Freq: Every day | CUTANEOUS | 0 refills | Status: DC | PRN

## 2022-06-14 MED ORDER — CEPHALEXIN 500 MG PO CAPS: 500.0000 mg | ORAL_CAPSULE | Freq: Two times a day (BID) | ORAL | 0 refills | Status: DC

## 2022-06-14 MED ORDER — LIDOCAINE-EPINEPHRINE-TETRACAINE (LET) TOPICAL GEL
3.0000 mL | Freq: Once | TOPICAL | Status: AC
  Administered 2022-06-14: 3 mL via TOPICAL

## 2022-06-14 MED ORDER — MUPIROCIN 2 % EX OINT: 1.0000 | TOPICAL_OINTMENT | Freq: Two times a day (BID) | CUTANEOUS | 0 refills | Status: DC

## 2022-06-14 NOTE — ED Triage Notes (Signed)
15 min ago pt was opening a toy for her grandson and cut herself with a razor blade.

## 2022-06-14 NOTE — ED Provider Notes (Signed)
[facility]    CSN: [id number] Arrival date & time: 06/14/22  1222      History   Chief Complaint No chief complaint on file.   HPI Dominique Hinton is a 51 y.o. female.   Patient presenting today with a laceration to her left thumb that occurred about 15 minutes prior to arrival when she was opening a toy for her grandson and cut the area with a razor blade.  She states she has been holding pressure on it which is slightly controlling the bleeding.  She denies loss of range of motion, numbness, tingling, discoloration of the surrounding area.  She has not yet cleaned it with anything.  She is unsure of when her last tetanus shot was, she thinks more than 5 years ago.    Past Medical History:  Diagnosis Date   Anxiety    Arthritis    Back pain    CAD (coronary artery disease)    Angioplasty of D1 January 2022   Essential hypertension    NSTEMI (non-ST elevated myocardial infarction) [facility])    January 2022    Patient Active Problem List   Diagnosis Date Noted   Gastroparesis 04/18/2022   Abdominal pain, epigastric 04/18/2022   Elevated LFTs 04/18/2022   Tooth infection 03/14/2022   Night sweats 12/13/2021   Irritable bowel syndrome with constipation 10/18/2021   Lower abdominal pain 09/13/2021   Screening for colorectal cancer 09/13/2021   History of MI (myocardial infarction) 08/23/2021   Anxiety and depression 08/23/2021   Pelvic pain 08/23/2021   Hot flashes 08/23/2021   Nausea without vomiting 08/23/2021   Screening mammogram for breast cancer 08/23/2021   S/P hysterectomy 08/23/2021   S/P PTCA (percutaneous transluminal coronary angioplasty)    NSTEMI (non-ST elevated myocardial infarction) ([place]) 09/21/2020   Chest pain 09/20/2020   Obesity (BMI 30.0-34.9) 09/20/2020   Essential hypertension 09/20/2020   Tobacco abuse 09/20/2020   Elevated troponin 09/20/2020   Pain in both upper extremities 06/26/2019   Weakness 06/26/2019    Past  Surgical History:  Procedure Laterality Date   ABDOMINAL HYSTERECTOMY     APPENDECTOMY     BIOPSY  10/25/2021   Procedure: BIOPSY;  Surgeon: Harvel Quale, MD;  [facility];  Service: Gastroenterology;;   BIOPSY  05/18/2022   Procedure: BIOPSY;  Surgeon: Harvel Quale, MD;  [facility];  Service: Gastroenterology;;   BREAST SURGERY     CARDIAC CATHETERIZATION  09/21/2020   CHOLECYSTECTOMY     COLONOSCOPY WITH PROPOFOL N/A 10/25/2021   Procedure: COLONOSCOPY WITH PROPOFOL;  Surgeon: Harvel Quale, MD;  [facility];  Service: Gastroenterology;  Laterality: N/A;  105   CORONARY BALLOON ANGIOPLASTY  09/21/2020   CORONARY/GRAFT ACUTE MI REVASCULARIZATION N/A 09/21/2020   Procedure: Coronary/Graft Acute MI Revascularization;  Surgeon: Jettie Booze, MD;  Location: [facility];  Service: Cardiovascular;  Laterality: N/A;   ESOPHAGOGASTRODUODENOSCOPY (EGD) WITH PROPOFOL N/A 05/18/2022   Procedure: ESOPHAGOGASTRODUODENOSCOPY (EGD) WITH PROPOFOL;  Surgeon: Harvel Quale, MD;  Location: [facility];  Service: Gastroenterology;  Laterality: N/A;  930 ASA 3   LEFT HEART CATH AND CORONARY ANGIOGRAPHY N/A 09/21/2020   Procedure: LEFT HEART CATH AND CORONARY ANGIOGRAPHY;  Surgeon: Jettie Booze, MD;  Location: [facility];  Service: Cardiovascular;  Laterality: N/A;   TUBAL LIGATION      OB History     Gravida  4   Para  3  Term  3   Preterm      AB  1   Living         SAB  1   IAB      Ectopic      Multiple      Live Births               Home Medications    Prior to Admission medications   Medication Sig Start Date End Date Taking? Authorizing Provider  cephALEXin (KEFLEX) 500 MG capsule Take 1 capsule (500 mg total) by mouth 2 (two) times daily. 06/14/22  Yes Volney American, PA-C  chlorhexidine (HIBICLENS) 4 % external liquid Apply topically daily as  needed. 06/14/22  Yes Volney American, PA-C  mupirocin ointment (BACTROBAN) 2 % Apply 1 Application topically 2 (two) times daily. 06/14/22  Yes Volney American, PA-C  aspirin 81 MG chewable tablet Chew 1 tablet (81 mg total) by mouth daily. 09/22/20   Cheryln Manly, NP  citalopram (CELEXA) 40 MG tablet TAKE 1 TABLET BY MOUTH DAILY 05/22/22   Derrek Monaco A, NP  cloNIDine (CATAPRES) 0.2 MG tablet Take 0.2 mg by mouth at bedtime. 10/07/21   [provider]  dicyclomine (BENTYL) 10 MG capsule TAKE 1 CAPSULE BY MOUTH TWICE DAILY 06/01/22   Carlan, Chelsea L, NP  Fexofenadine HCl (ALLEGRA PO) Take 180 mg by mouth daily.    [provider]  gabapentin (NEURONTIN) 400 MG capsule Take 400 mg by mouth 3 (three) times daily as needed (pain). 08/19/20   [provider]  hydrochlorothiazide (HYDRODIURIL) 25 MG tablet Take 1/2 (one-half) tablet by mouth once daily 11/04/21   Satira Sark, MD  HYDROcodone-acetaminophen [facility]) 10-325 MG tablet Take 1 tablet by mouth 4 (four) times daily as needed for pain. 09/16/20   [provider]  hypromellose (SYSTANE OVERNIGHT THERAPY) 0.3 % GEL ophthalmic ointment Place 1 Application into both eyes at bedtime.    [provider]  LORazepam (ATIVAN) 0.5 MG tablet Take one tablet 1 hour prior to MRI, can repeat dose prior to imaging if needed for anxiety. 04/25/22   Carlan, Chelsea L, NP  losartan (COZAAR) 50 MG tablet Take 1 tablet (50 mg total) by mouth daily. 05/05/22 05/05/23  Satira Sark, MD  metoCLOPramide (REGLAN) 10 MG tablet Take 0.5 tablets (5 mg total) by mouth 4 (four) times daily -  before meals and at bedtime. 04/18/22   Carlan, Chelsea L, NP  nitroGLYCERIN (NITROSTAT) 0.4 MG SL tablet PLACE 1 TABLET (0.4 MG TOTAL) UNDER THE TONGUE EVERY FIVE MINUTES AS NEEDED FOR CHEST PAIN. 05/05/22 05/05/23  Satira Sark, MD  OVER THE COUNTER MEDICATION Take 1 capsule by mouth in the morning and at  bedtime. Women's Natural Transition    [provider]  rosuvastatin (CRESTOR) 20 MG tablet TAKE 1 TABLET BY MOUTH EVERY DAY AT 6PM 01/10/22   Satira Sark, MD  tiZANidine (ZANAFLEX) 4 MG tablet Take 4-8 mg by mouth See admin instructions. Take 4 mg by mouth in the day and take 8 mg at bedtime 09/16/20   [provider]  UNABLE TO FIND Take 1,500 mg by mouth daily. Soy capsules    [provider]  valACYclovir (VALTREX) 1000 MG tablet Take 1,000 mg by mouth daily. 05/06/19   [provider]    Family History Family History  Problem Relation Age of Onset   Alzheimer's disease Father    Hypertension Mother  Other Mother        "borderline diabetic"   Dementia Mother    Thyroid disease Mother    Thyroid disease Brother    Anxiety disorder Daughter    Depression Daughter    Depression Daughter    Anxiety disorder Daughter    ODD Son    ADD / ADHD Son     Social History Social History   Tobacco Use   Smoking status: Every Day    Packs/day: 1.00    Years: 30.00    Total pack years: 30.00    Types: Cigarettes    Passive exposure: Current   Smokeless tobacco: Never  Vaping Use   Vaping Use: Never used  Substance Use Topics   Alcohol use: Yes    Comment: occasionally   Drug use: Yes    Types: Marijuana    Comment: daily     Allergies   Cortisone and Doxycycline   Review of Systems Review of Systems Per HPI  Physical Exam Triage Vital Signs ED Triage Vitals [06/14/22 1248]  Enc Vitals Group     BP 107/74     Pulse Rate 84     Resp 20     Temp 98.4 F (36.9 C)     Temp Source Oral     SpO2 96 %     Weight      Height      Head Circumference      Peak Flow      Pain Score 9     Pain Loc      Pain Edu?      Excl. in [place]?    No data found.  Updated Vital Signs BP 107/74 (BP Location: Right Arm)   Pulse 84   Temp 98.4 F (36.9 C) (Oral)   Resp 20   SpO2 96%   Visual Acuity Right Eye Distance:   Left Eye  Distance:   Bilateral Distance:    Right Eye Near:   Left Eye Near:    Bilateral Near:     Physical Exam Vitals and nursing note reviewed.  Constitutional:      Appearance: Normal appearance. She is not ill-appearing.  HENT:     Head: Atraumatic.  Eyes:     Extraocular Movements: Extraocular movements intact.     Conjunctiva/sclera: Conjunctivae normal.  Cardiovascular:     Rate and Rhythm: Normal rate and regular rhythm.     Heart sounds: Normal heart sounds.  Pulmonary:     Effort: Pulmonary effort is normal.     Breath sounds: Normal breath sounds.  Musculoskeletal:        General: Tenderness and signs of injury present. Normal range of motion.     Cervical back: Normal range of motion and neck supple.  Skin:    General: Skin is warm.     Comments: 2.5 cm laceration, well approximated and not gaping to the left thumb.  Bleeding well controlled unless pressure applied or movement of the finger.  No damage to the nail, no foreign bodies appreciable  Neurological:     Mental Status: She is alert and oriented to person, place, and time.     Comments: Left hand neurovascularly intact  Psychiatric:        Mood and Affect: Mood normal.        Thought Content: Thought content normal.        Judgment: Judgment normal.      UC Treatments / Results  Labs (all  labs ordered are listed, but only abnormal results are displayed) Labs Reviewed - No data to display  EKG   Radiology No results found.  Procedures Laceration Repair  Date/Time: 06/14/2022 7:20 PM  Performed by: Volney American, PA-C Authorized by: Volney American, PA-C   Consent:    Consent obtained:  Verbal   Consent given by:  Patient   Risks, benefits, and alternatives were discussed: yes     Risks discussed:  Infection and need for additional repair   Alternatives discussed: Sutures versus Dermabond. Universal protocol:    Procedure explained and questions answered to patient or proxy's  satisfaction: yes     Relevant documents present and verified: yes     Patient identity confirmed:  Verbally with patient Anesthesia:    Anesthesia method:  Topical application   Topical anesthetic:  LET Laceration details:    Location:  Finger   Finger location:  L thumb   Length (cm):  2.5   Depth (mm):  2 Pre-procedure details:    Preparation:  Patient was prepped and draped in usual sterile fashion Exploration:    Hemostasis achieved with:  Direct pressure and LET   Wound exploration: wound explored through full range of motion     Contaminated: no   Treatment:    Area cleansed with:  Chlorhexidine   Amount of cleaning:  Standard   Visualized foreign bodies/material removed: no   Skin repair:    Repair method:  Tissue adhesive Approximation:    Approximation:  Close Repair type:    Repair type:  Simple Post-procedure details:    Dressing:  Non-adherent dressing and splint for protection   Procedure completion:  Tolerated well, no immediate complications  (including critical care time)  Medications Ordered in UC Medications  lidocaine-EPINEPHrine-tetracaine (LET) topical gel (3 mLs Topical Given 06/14/22 1321)  Tdap (BOOSTRIX) injection 0.5 mL (0.5 mLs Intramuscular Given 06/14/22 1351)    Initial Impression / Assessment and Plan / UC Course  I have reviewed the triage vital signs and the nursing notes.  Pertinent labs & imaging results that were available during my care of the patient were reviewed by me and considered in my medical decision making (see chart for details).     Let gel was applied to the wound to help with bleeding and pain, Dermabond used for closure without complication.  Nonstick dressing and splint applied for protection.  Tetanus updated.  We will treat with Keflex and Hibiclens, mupirocin sent for once Dermabond comes off for good wound care.  Follow-up for signs of infection if noticing any. Final Clinical Impressions(s) / UC Diagnoses    Final diagnoses:  Laceration of left thumb without foreign body without damage to nail, initial encounter  Need for Tdap vaccination   Discharge Instructions   None    ED Prescriptions     Medication Sig Dispense Auth. Provider   cephALEXin (KEFLEX) 500 MG capsule Take 1 capsule (500 mg total) by mouth 2 (two) times daily. 14 capsule Volney American, [place]   chlorhexidine (HIBICLENS) 4 % external liquid Apply topically daily as needed. 120 mL Volney American, PA-C   mupirocin ointment (BACTROBAN) 2 % Apply 1 Application topically 2 (two) times daily. 22 g Volney American, [place]      PDMP not reviewed this encounter.   Volney American, [place] 06/14/22 1922

## 2022-06-23 IMAGING — US US PELVIS COMPLETE WITH TRANSVAGINAL
1 series · 14 of 25 positions shown · non-contrast
Comparison: 05/30/2009

CLINICAL DATA: Pelvic pain

EXAM:
TRANSABDOMINAL AND TRANSVAGINAL ULTRASOUND OF PELVIS
TECHNIQUE: Both transabdominal and transvaginal ultrasound examinations of the
pelvis were performed. Transabdominal technique was performed for
global imaging of the pelvis including uterus, ovaries, adnexal
regions, and pelvic cul-de-sac. It was necessary to proceed with
endovaginal exam following the transabdominal exam to visualize the
ovaries and adnexa.

[Series 1: us pelvic complete with transvaginal · 14 of 67 slices shown]
[im 1/67]
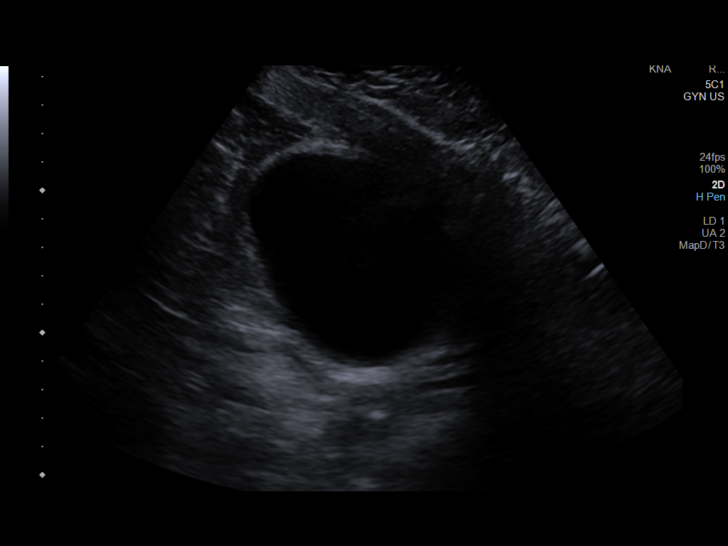
[im 6/67]
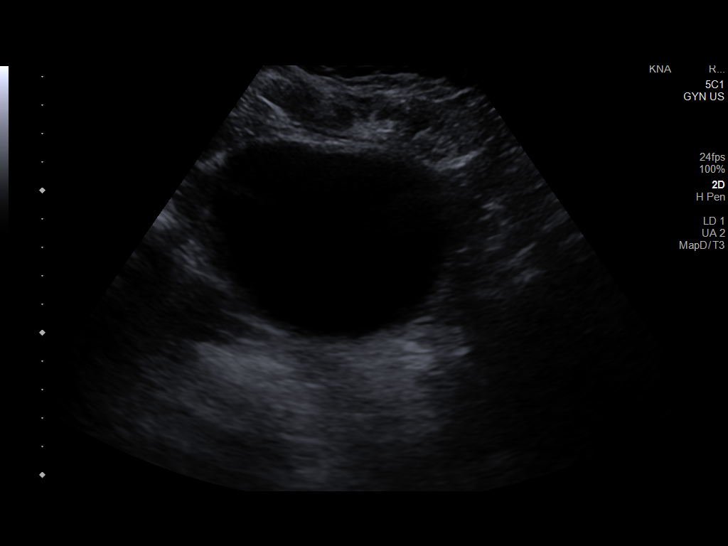
[im 12/67]
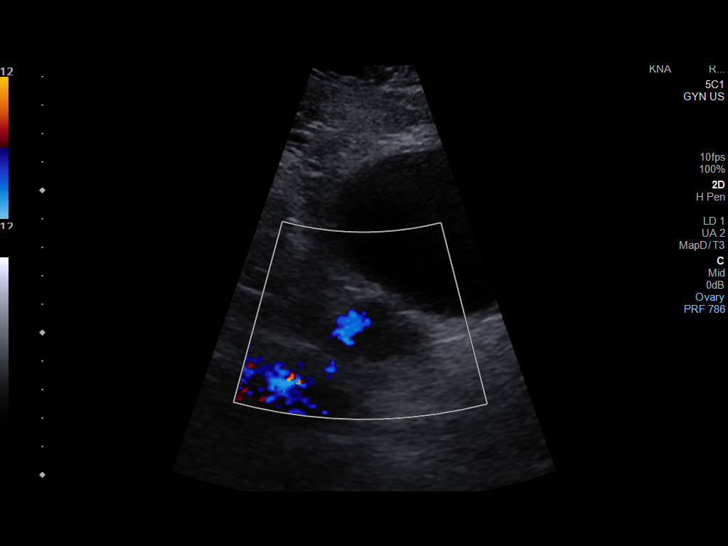
[im 17/67]
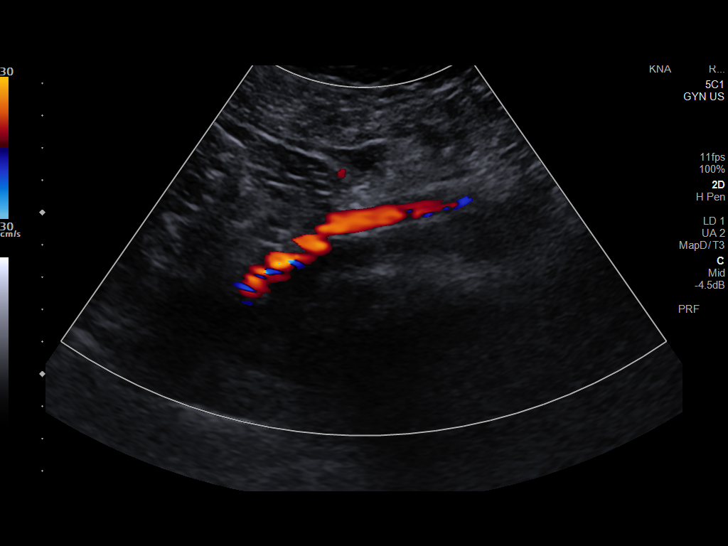
[im 23/67]
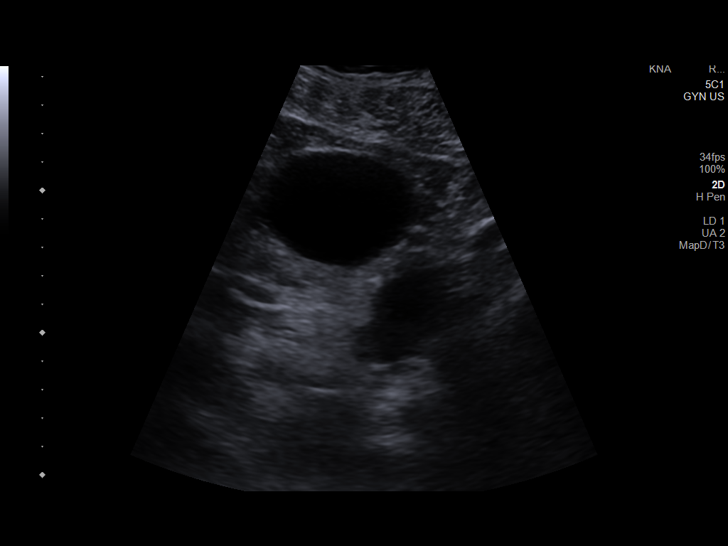
[im 25/67]
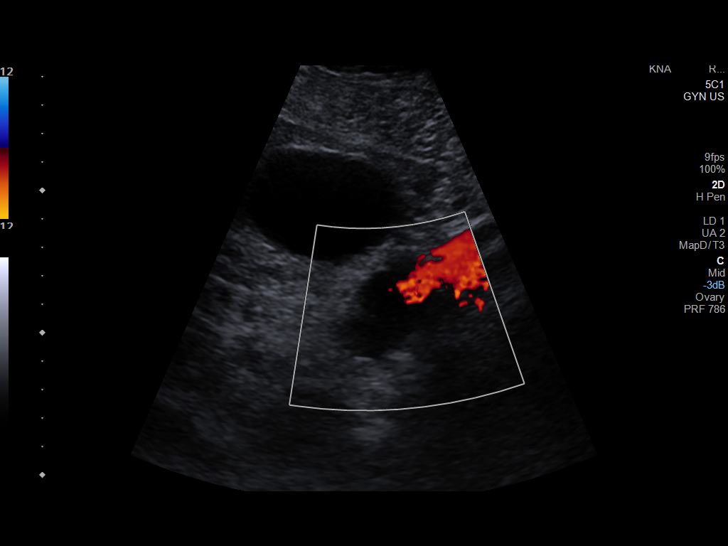
[im 31/67]
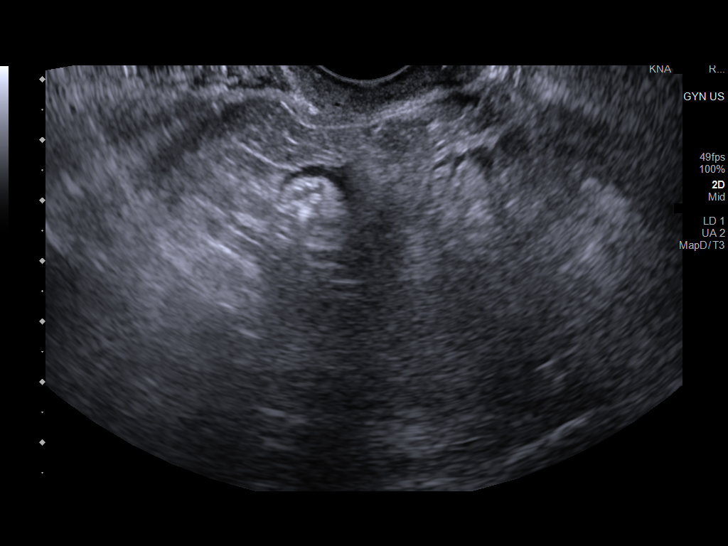
[im 36/67]
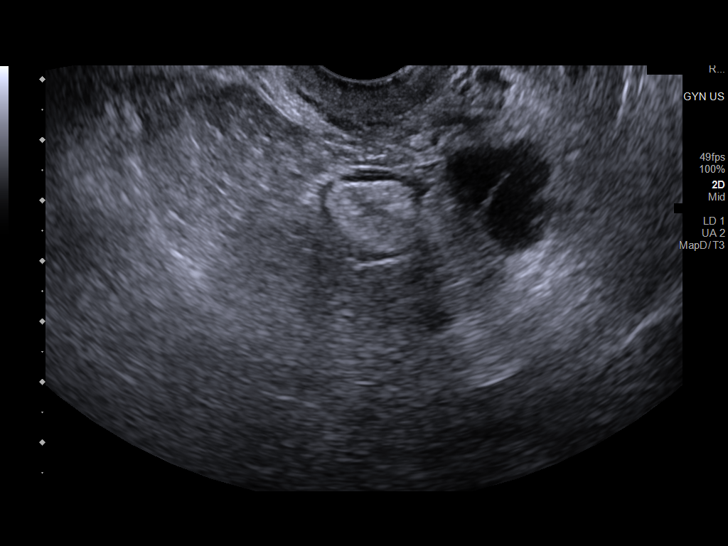
[im 42/67]
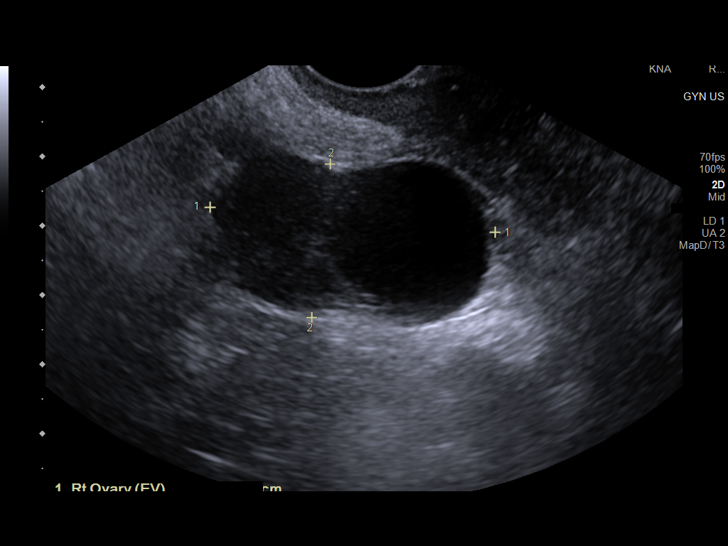
[im 45/67]
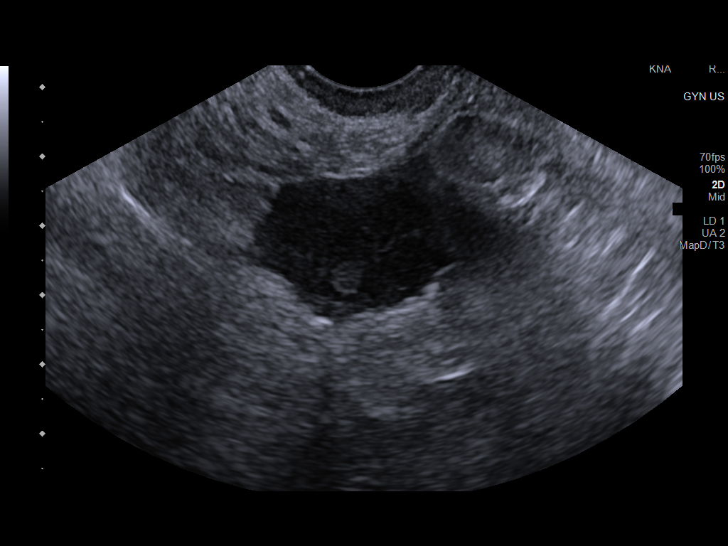
[im 50/67]
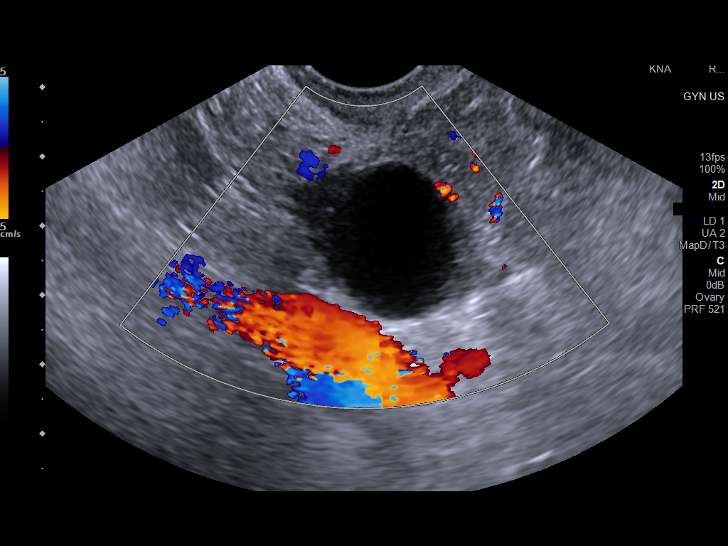
[im 56/67]
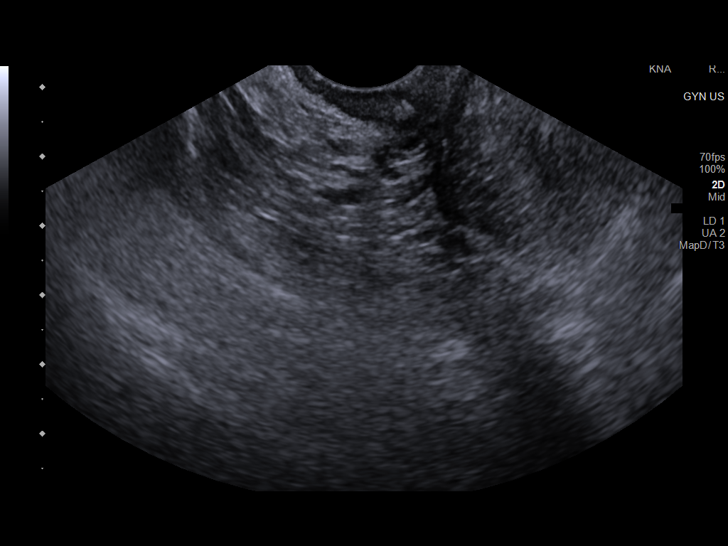
[im 61/67]
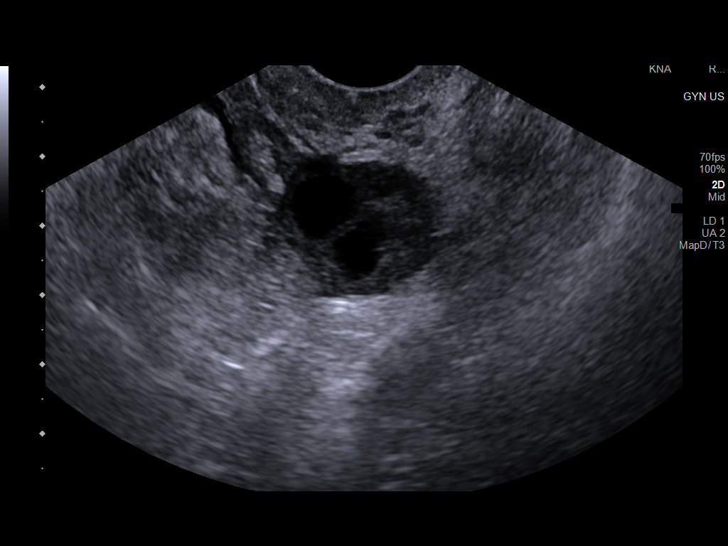
[im 67/67]
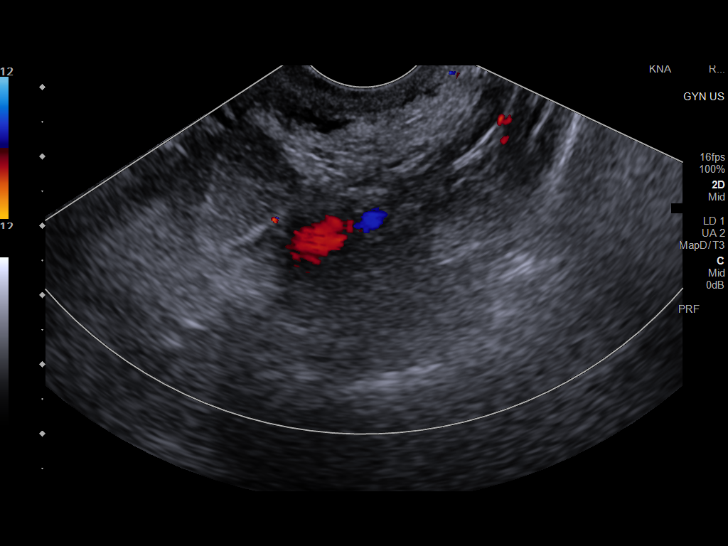

[14 of 25 positions shown; findings below may reference images not displayed]

FINDINGS: Uterus

Measurements: Prior hysterectomy

Endometrium

Thickness: Prior hysterectomy.

Right ovary

Measurements: 4.1 x 2.2 x 2.9 cm = volume: 13.9 mL. Small follicle
measures 1.9 cm. No adnexal mass.

Left ovary

Measurements: 3.6 x 2.2 x 2.3 cm = volume: 9.9 mL. Normal
appearance/no adnexal mass.

Other findings

No abnormal free fluid.
IMPRESSION: No acute findings or significant abnormality.

## 2022-06-23 IMAGING — MG MM DIGITAL SCREENING BILAT W/ TOMO AND CAD
8 series · 8 of 24 positions shown · non-contrast
Comparison: None.

CLINICAL DATA: Screening.

EXAM:
DIGITAL SCREENING BILATERAL MAMMOGRAM WITH TOMOSYNTHESIS AND CAD
TECHNIQUE: Bilateral screening digital craniocaudal and mediolateral oblique
mammograms were obtained. Bilateral screening digital breast
tomosynthesis was performed. The images were evaluated with
computer-aided detection.

[L MLO synth-2D]
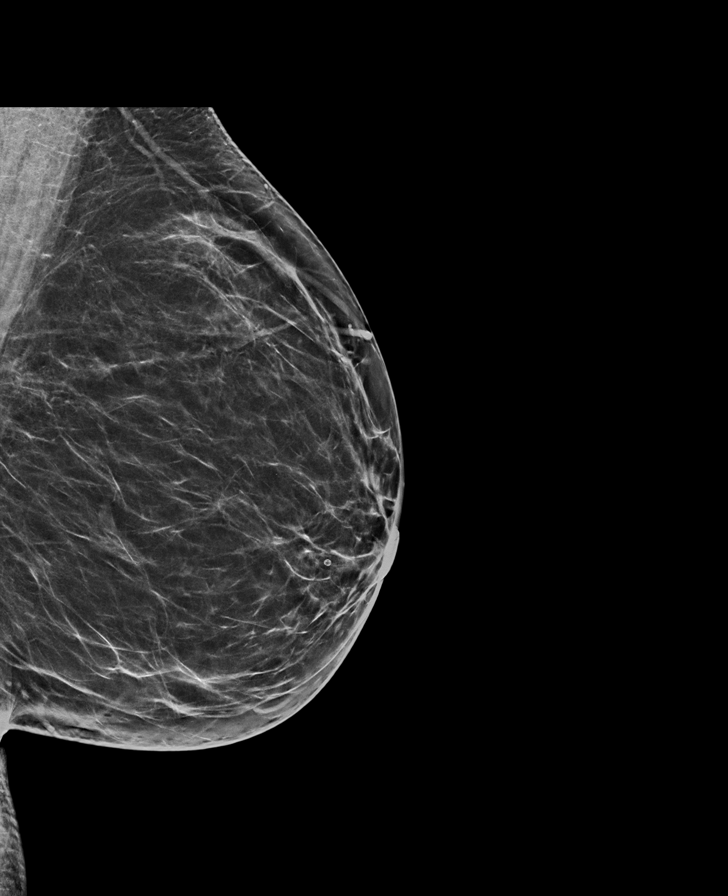

[R MLO synth-2D]
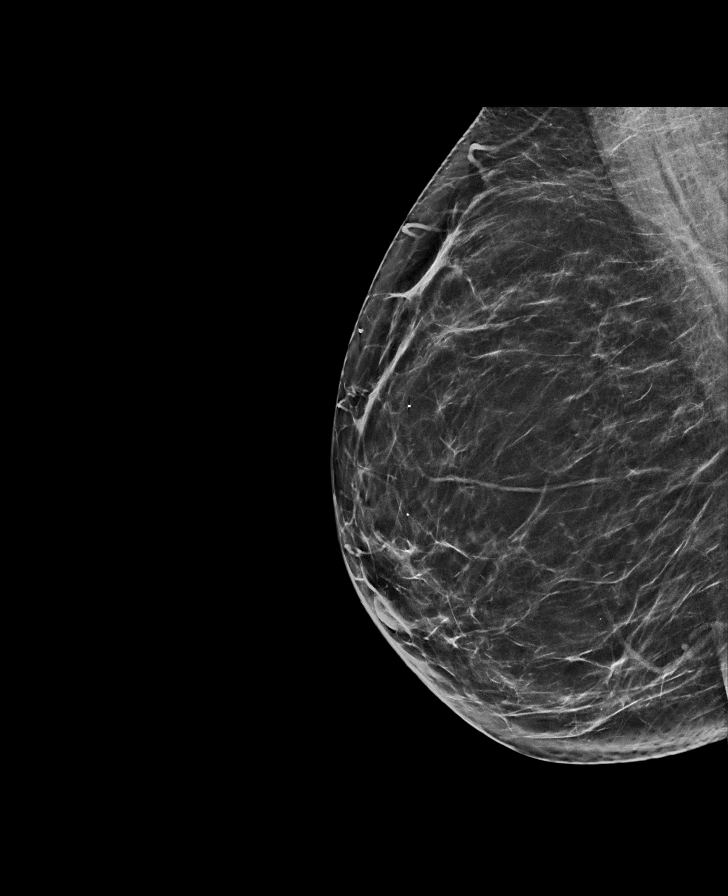

[R CC synth-2D]
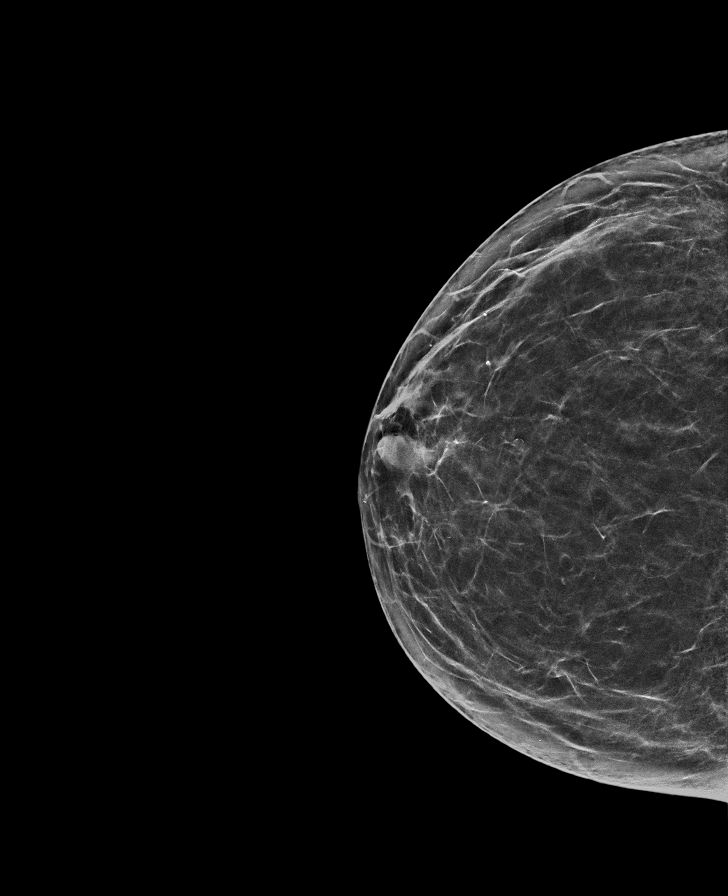

[L CC synth-2D]
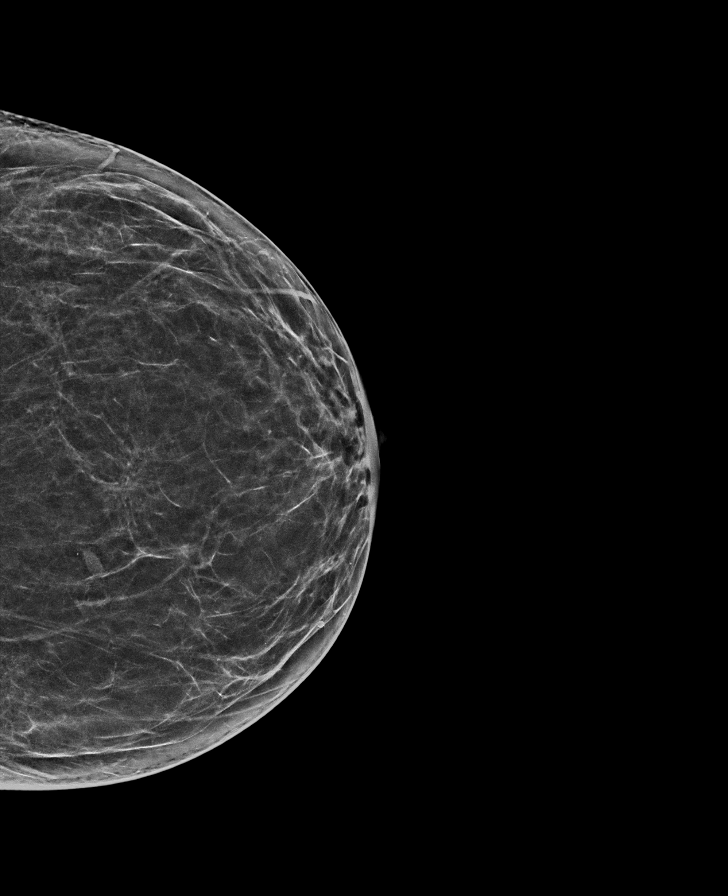

[R CC tomo · tomo slice 34/67.0]
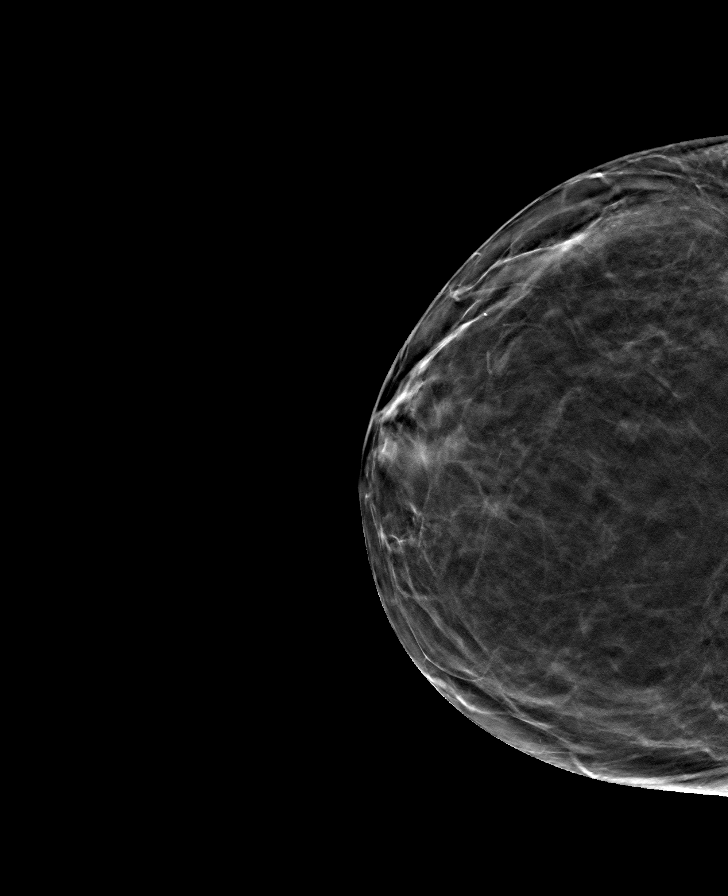

[R MLO tomo · tomo slice 35/69.0]
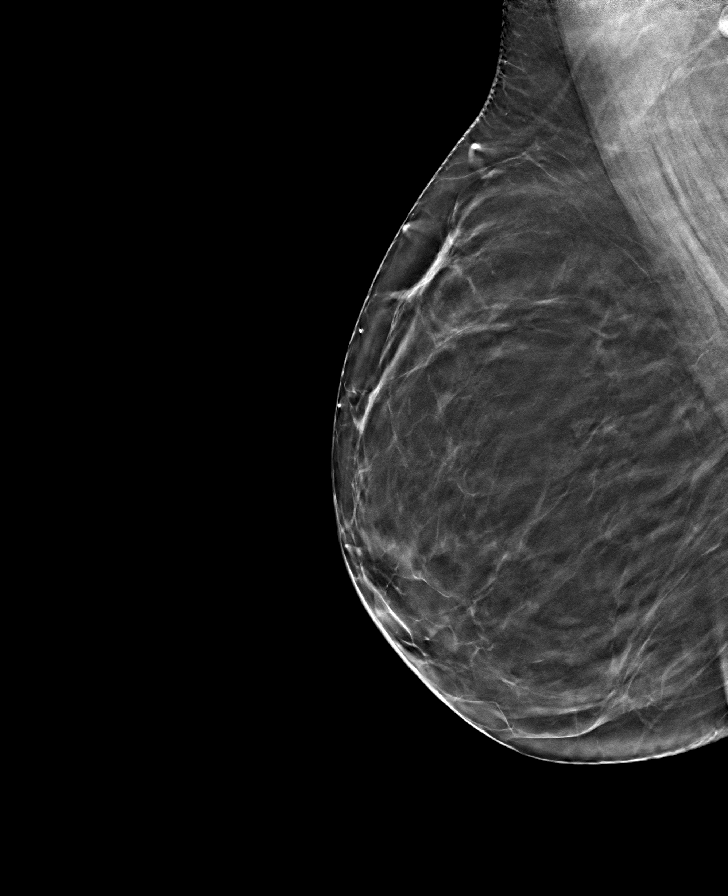

[L MLO tomo · tomo slice 35/70.0]
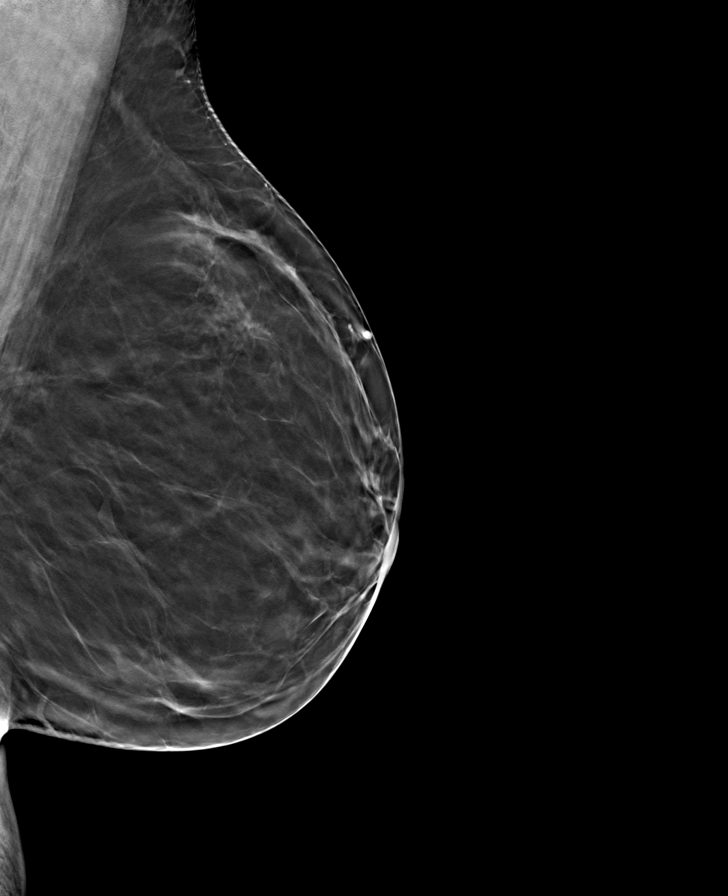

[L CC tomo · tomo slice 33/64.0]
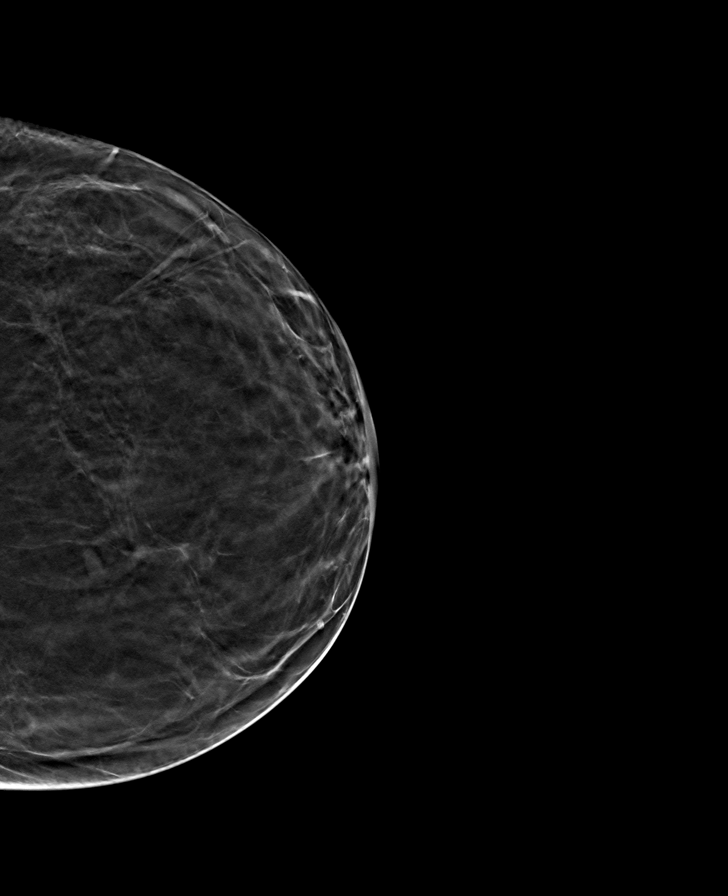

[8 of 24 positions shown; findings below may reference images not displayed]

ACR Breast Density Category b: There are scattered areas of
fibroglandular density.
FINDINGS: There are no findings suspicious for malignancy.
IMPRESSION: No mammographic evidence of malignancy. A result letter of this
screening mammogram will be mailed directly to the patient.

RECOMMENDATION:
Screening mammogram in one year. (Code:XG-X-X7B)

BI-RADS CATEGORY  1: Negative.

## 2022-07-06 DIAGNOSIS — Z79899 Other long term (current) drug therapy: Secondary | ICD-10-CM | POA: Diagnosis not present

## 2022-07-10 DIAGNOSIS — H9011 Conductive hearing loss, unilateral, right ear, with unrestricted hearing on the contralateral side: Secondary | ICD-10-CM | POA: Diagnosis not present

## 2022-07-10 DIAGNOSIS — H6991 Unspecified Eustachian tube disorder, right ear: Secondary | ICD-10-CM | POA: Diagnosis not present

## 2022-07-10 DIAGNOSIS — H6981 Other specified disorders of Eustachian tube, right ear: Secondary | ICD-10-CM | POA: Diagnosis not present

## 2022-07-12 DIAGNOSIS — Z79899 Other long term (current) drug therapy: Secondary | ICD-10-CM | POA: Diagnosis not present

## 2022-07-20 ENCOUNTER — Encounter (INDEPENDENT_AMBULATORY_CARE_PROVIDER_SITE_OTHER): Payer: Self-pay | Admitting: Gastroenterology

## 2022-07-20 ENCOUNTER — Ambulatory Visit (INDEPENDENT_AMBULATORY_CARE_PROVIDER_SITE_OTHER): Payer: Medicaid Other | Admitting: Gastroenterology

## 2022-07-20 VITALS — BP 108/75 | HR 84 | Temp 97.5°F | Ht 66.0 in | Wt 184.3 lb

## 2022-07-20 DIAGNOSIS — K3184 Gastroparesis: Secondary | ICD-10-CM | POA: Diagnosis not present

## 2022-07-20 DIAGNOSIS — R7989 Other specified abnormal findings of blood chemistry: Secondary | ICD-10-CM

## 2022-07-20 DIAGNOSIS — Z79891 Long term (current) use of opiate analgesic: Secondary | ICD-10-CM | POA: Diagnosis not present

## 2022-07-20 DIAGNOSIS — K581 Irritable bowel syndrome with constipation: Secondary | ICD-10-CM

## 2022-07-20 DIAGNOSIS — R110 Nausea: Secondary | ICD-10-CM

## 2022-07-20 DIAGNOSIS — R768 Other specified abnormal immunological findings in serum: Secondary | ICD-10-CM

## 2022-07-20 LAB — COMPREHENSIVE METABOLIC PANEL
AG Ratio: 1.6 (calc) (ref 1.0–2.5)
ALT: 20 U/L (ref 6–29)
AST: 16 U/L (ref 10–35)
Albumin: 3.9 g/dL (ref 3.6–5.1)
Alkaline phosphatase (APISO): 48 U/L (ref 37–153)
BUN/Creatinine Ratio: 7 (calc) (ref 6–22)
BUN: 6 mg/dL — ABNORMAL LOW (ref 7–25)
CO2: 28 mmol/L (ref 20–32)
Calcium: 9.2 mg/dL (ref 8.6–10.4)
Chloride: 103 mmol/L (ref 98–110)
Creat: 0.86 mg/dL (ref 0.50–1.03)
Globulin: 2.4 g/dL (calc) (ref 1.9–3.7)
Glucose, Bld: 90 mg/dL (ref 65–99)
Potassium: 3.3 mmol/L — ABNORMAL LOW (ref 3.5–5.3)
Sodium: 140 mmol/L (ref 135–146)
Total Bilirubin: 0.5 mg/dL (ref 0.2–1.2)
Total Protein: 6.3 g/dL (ref 6.1–8.1)

## 2022-07-20 MED ORDER — LINACLOTIDE 72 MCG PO CAPS: 72.0000 ug | ORAL_CAPSULE | Freq: Every day | ORAL | 1 refills | Status: DC

## 2022-07-20 NOTE — Patient Instructions (Signed)
Rheumatology referral Perform blood workup Continue Reglan 5 mg 2-3 times a day Continue with Bentyl as needed for abdominal pain Start Linzess 72 mcg qday

## 2022-07-20 NOTE — Progress Notes (Signed)
Dominique Hinton, M.D. Gastroenterology & Hepatology [facility] [address] [place], [place] [address]  Primary Care Physician: Asencion Noble, MD [address] [place] [address]  I will communicate my assessment and recommendations to the referring MD via EMR.  Problems: Gastroparesis, possibly related to opiate use Elevated LFTs  History of Present Illness: Dominique Hinton is a 51 y.o. female with past medical history of gastroparesis, anxiety, arthritis, back pain, CAD, HTN, NSTEMI (2022), coming for follow-up of abdominal pain and elevated LFTs.  The patient was last seen on 04/18/2022. At that time, the patient was advised to take Bentyl as needed for abdominal pain and she was advised to start Reglan 5 mg with each meal.  She also underwent an EGD with finding described below.  Underwent blood work-up for elevated LFTs which showed normal iron stores, normal alpha-1 antitrypsin, positive ANA with a titer of 1:320, negative anti-smooth muscle antibodies, with normal ceruloplasmin of 29, negative acute hepatitis panel.  Notably, AMA was 67.  Her previously elevated liver function tests in July 2023 essentially showed elevated AST of 90 and ALT of 96 with total bilirubin of 1.2 and alkaline phosphatase of 104.  Due to this, she underwent a liver biopsy on 05/25/2022 with the following findings: The biopsy shows mild macrovesicular steatosis comprising about 10% of parenchymal volume.  There is no evidence of ballooning  degeneration. Rare foci of lobular necroinflammation are present but do not appear to be targeting the steatotic cells. Portal tracts are generally compact and show mild mostly lymphocytic inflammation. There only focal, mild interface hepatitis with rare plasma cells. Mild ductular reaction is present but native bile ducts and portal vascular structures appear unremarkable.  An incidental, small bile duct hamartoma is  noted. Trichrome and reticulin stains show that portal tracts are mostly compact, appropriately sized for the structures contained within, and are negative for pathologic fibrosis.  There is minimal centrilobular fibrosis of uncertain significance.  Iron stain shows no further pathologic features.  PASD stain shows scattered ceroid-related macrophages within sinusoids and portal tracts.  The overall changes are mild and nonspecific. Differential diagnosis can include mild drug-induced injury. Also, many systemic illnesses can cause non-specific reactive hepatitis.  Patient's positive ANA and AMA tests are noted but histologic features of autoimmune hepatitis or primary biliary cholangitis are not seen in the submitted biopsies. Patient follow-up is suggested as some of these patients are known to progress in future.   Notably, repeat LFTs on 05/07/2022 showed an AST of 23 and ALT of 25, alkaline phosphatase was 68 and total bilirubin was 1.2.  Patient reports that she has noticed recurrent episodes of abdominal pain in the morning. She states she wakes up very sweaty and with nausea, along with significant abdominal bloating which is extremely uncomfortable. She has some improvement while taking Bentyl as needed for abdominal pain. She used to take Phenergan in the past but has stopped after starting the Reglan. She takes Reglan possibly twice a day as she only has 2 meals per day, thinks it has helped her decrease her symptoms.  She reports that she could not tolerate Miralax - she reports having nausea when taking it. Only uses Dulcolax when very constipated. She has 4 Bms per week, which helps relieving her symptoms.  The patient denies having any nausea, vomiting, fever, chills, hematochezia, melena, hematemesis, diarrhea, jaundice, pruritus or weight loss.  She takes Percocet 3 times a day, 1/2 - 1 tablet per  day.  Last EGD:05/18/2022 Normal esophagus, stomach and small bowel.  Path: A. SMALL  BOWEL, BIOPSY:  - Small intestinal mucosa with no specific histopathologic changes  - Negative for increased intraepithelial lymphocytes or villous  architectural changes   B. STOMACH, BIOPSY:  - Gastric antral mucosa with mild nonspecific reactive gastropathy  - Gastric oxyntic mucosa with no specific histopathologic changes  - Helicobacter pylori-like organisms are not identified on routine HE  stain   Last Colonoscopy: 10/25/2021 The perianal and digital rectal examinations were normal. There was a small lipoma, in the ascending colon. Localized areas of mildly congested and erythematous mucosa was found in the sigmoid colon (? SCAD). Biopsies were taken with a cold forceps for histology. The retroflexed view of the distal rectum and anal verge was normal and showed no anal or rectal abnormalities.  Path: FINAL MICROSCOPIC DIAGNOSIS:   A. COLON, RANDOM, SIGMOID, BIOPSY:      - Healed ulcer with dilated blood vessels.      - No evidence of microscopic colitis, active colitis, dysplasia or  malignancy.   Recommended repeat in 10 years  Past Medical History: Past Medical History:  Diagnosis Date   Anxiety    Arthritis    Back pain    CAD (coronary artery disease)    Angioplasty of D1 January 2022   Essential hypertension    NSTEMI (non-ST elevated myocardial infarction) [facility])    January 2022    Past Surgical History: Past Surgical History:  Procedure Laterality Date   ABDOMINAL HYSTERECTOMY     APPENDECTOMY     BIOPSY  10/25/2021   Procedure: BIOPSY;  Surgeon: Harvel Quale, MD;  [facility];  Service: Gastroenterology;;   BIOPSY  05/18/2022   Procedure: BIOPSY;  Surgeon: Harvel Quale, MD;  [facility];  Service: Gastroenterology;;   BREAST SURGERY     CARDIAC CATHETERIZATION  09/21/2020   CHOLECYSTECTOMY     COLONOSCOPY WITH PROPOFOL N/A 10/25/2021   Procedure: COLONOSCOPY WITH PROPOFOL;  Surgeon: Harvel Quale, MD;  [facility]: [facility];  Service: Gastroenterology;  Laterality: N/A;  105   CORONARY BALLOON ANGIOPLASTY  09/21/2020   CORONARY/GRAFT ACUTE MI REVASCULARIZATION N/A 09/21/2020   Procedure: Coronary/Graft Acute MI Revascularization;  Surgeon: Jettie Booze, MD;  Location: [place] [facility];  Service: Cardiovascular;  Laterality: N/A;   ESOPHAGOGASTRODUODENOSCOPY (EGD) WITH PROPOFOL N/A 05/18/2022   Procedure: ESOPHAGOGASTRODUODENOSCOPY (EGD) WITH PROPOFOL;  Surgeon: Harvel Quale, MD;  [facility];  Service: Gastroenterology;  Laterality: N/A;  930 ASA 3   LEFT HEART CATH AND CORONARY ANGIOGRAPHY N/A 09/21/2020   Procedure: LEFT HEART CATH AND CORONARY ANGIOGRAPHY;  Surgeon: Jettie Booze, MD;  Location: [place] [facility];  Service: Cardiovascular;  Laterality: N/A;   TUBAL LIGATION      Family History: Family History  Problem Relation Age of Onset   Alzheimer's disease Father    Hypertension Mother    Other Mother        "borderline diabetic"   Dementia Mother    Thyroid disease Mother    Thyroid disease Brother    Anxiety disorder Daughter    Depression Daughter    Depression Daughter    Anxiety disorder Daughter    ODD Son    ADD / ADHD Son     Social History: Social History   Tobacco Use  Smoking Status Every Day   Packs/day: 1.00   Years: 30.00   Total  pack years: 30.00   Types: Cigarettes   Passive exposure: Current  Smokeless Tobacco Never   Social History   Substance and Sexual Activity  Alcohol Use Yes   Comment: occasionally   Social History   Substance and Sexual Activity  Drug Use Yes   Types: Marijuana   Comment: daily    Allergies: Allergies  Allergen Reactions   Cortisone Shortness Of Breath and Other (See Comments)    Made me feel loopy   Doxycycline Rash    Medications: Current Outpatient Medications  Medication Sig Dispense Refill   aspirin 81 MG chewable tablet Chew 1 tablet (81 mg  total) by mouth daily. 90 tablet 1   citalopram (CELEXA) 40 MG tablet TAKE 1 TABLET BY MOUTH DAILY 90 tablet 2   cloNIDine (CATAPRES) 0.2 MG tablet Take 0.2 mg by mouth at bedtime.     dicyclomine (BENTYL) 10 MG capsule TAKE 1 CAPSULE BY MOUTH TWICE DAILY 180 capsule 0   docusate sodium (COLACE) 100 MG capsule Take 100 mg by mouth 2 (two) times daily.     Fexofenadine HCl (ALLEGRA PO) Take 180 mg by mouth daily.     gabapentin (NEURONTIN) 400 MG capsule Take 400 mg by mouth 3 (three) times daily as needed (pain).     hydrochlorothiazide (HYDRODIURIL) 25 MG tablet Take 1/2 (one-half) tablet by mouth once daily 45 tablet 3   hypromellose (SYSTANE OVERNIGHT THERAPY) 0.3 % GEL ophthalmic ointment Place 1 Application into both eyes at bedtime.     losartan (COZAAR) 50 MG tablet Take 1 tablet (50 mg total) by mouth daily. 90 tablet 3   metoCLOPramide (REGLAN) 10 MG tablet Take 0.5 tablets (5 mg total) by mouth 4 (four) times daily -  before meals and at bedtime. 90 tablet 1   nitroGLYCERIN (NITROSTAT) 0.4 MG SL tablet PLACE 1 TABLET (0.4 MG TOTAL) UNDER THE TONGUE EVERY FIVE MINUTES AS NEEDED FOR CHEST PAIN. 25 tablet 2   Omega-3 Fatty Acids (FISH OIL) 1000 MG CAPS Take by mouth daily.     OVER THE COUNTER MEDICATION Take 1 capsule by mouth in the morning and at bedtime. Women's Natural Transition     oxyCODONE-acetaminophen (PERCOCET/ROXICET) 5-325 MG tablet Take by mouth every 8 (eight) hours as needed for severe pain.     rosuvastatin (CRESTOR) 20 MG tablet TAKE 1 TABLET BY MOUTH EVERY DAY AT 6PM 90 tablet 3   tiZANidine (ZANAFLEX) 4 MG tablet Take 4-8 mg by mouth See admin instructions. Take 4 mg by mouth in the day and take 8 mg at bedtime     UNABLE TO FIND Take 1,500 mg by mouth daily. Soy capsules     valACYclovir (VALTREX) 1000 MG tablet Take 1,000 mg by mouth daily.     Vitamin E 670 MG (1000 UT) CAPS Take by mouth daily.     No current facility-administered medications for this visit.     Review of Systems: GENERAL: negative for malaise, night sweats HEENT: No changes in hearing or vision, no nose bleeds or other nasal problems. NECK: Negative for lumps, goiter, pain and significant neck swelling RESPIRATORY: Negative for cough, wheezing CARDIOVASCULAR: Negative for chest pain, leg swelling, palpitations, orthopnea GI: SEE HPI MUSCULOSKELETAL: Negative for joint pain or swelling, back pain, and muscle pain. SKIN: Negative for lesions, rash PSYCH: Negative for sleep disturbance, mood disorder and recent psychosocial stressors. HEMATOLOGY Negative for prolonged bleeding, bruising easily, and swollen nodes. ENDOCRINE: Negative for cold or heat intolerance, polyuria, polydipsia and  goiter. NEURO: negative for tremor, gait imbalance, syncope and seizures. The remainder of the review of systems is noncontributory.   Physical Exam: BP 108/75 (BP Location: Left Arm, Patient Position: Sitting, Cuff Size: Large)   Pulse 84   Temp (!) 97.5 F (36.4 C) (Temporal)   Ht '5\' 6"'$  (1.676 m)   Wt 184 lb 4.8 oz (83.6 kg)   BMI 29.75 kg/m  GENERAL: The patient is AO x3, in no acute distress. HEENT: Head is normocephalic and atraumatic. EOMI are intact. Mouth is well hydrated and without lesions. NECK: Supple. No masses LUNGS: Clear to auscultation. No presence of rhonchi/wheezing/rales. Adequate chest expansion HEART: RRR, normal s1 and s2. ABDOMEN: Soft, nontender, no guarding, no peritoneal signs, and nondistended. BS +. No masses. EXTREMITIES: Without any cyanosis, clubbing, rash, lesions or edema. NEUROLOGIC: AOx3, no focal motor deficit. SKIN: no jaundice, no rashes  Imaging/Labs: as above  I personally reviewed and interpreted the available labs, imaging and endoscopic files.  Impression and Plan: Dominique Hinton is a 51 y.o. female with past medical history of gastroparesis, anxiety, arthritis, back pain, CAD, HTN, NSTEMI (2022), coming for follow-up of abdominal  pain and elevated LFTs.  The patient has presented with urinary emptying abdominal pain and abdominal bloating with negative endoscopic evaluation for any organic pathology.  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  At this moment, we will start her on Linzess 72 mcg every day to improve her bowel movements and decrease her abdominal distention but she can also continue on Reglan 5 mg 2-3 times per day.  If this fails to lead to significant improvement of her symptoms, may consider taking the Reglan earlier during the day and performing a CT of the abdomen and pelvis with IV contrast.  She had elevation of her liver function tests in the past with presence of positive ANA and AMA, which led to performing a liver biopsy.  Liver biopsy showed inspecific findings but no abnormalities consistent with PBC or autoimmune hepatitis.  Fortunately, her liver function test normalized.  It is possible that her LFT elevation was related to drug-induced liver injury that was self-limited.  We will repeat a CMP to monitor her liver status.  Finally, she had significantly elevated ANA titer, we will refer her to rheumatology.  - Repeat CMP - Continue Reglan 5 mg 2-3 times a day - Continue with Bentyl as needed for abdominal pain - Start Linzess 72 mcg qday - Rheumatology referral  All questions were answered.      Dominique Peppers, MD Gastroenterology and Hepatology [facility]

## 2022-08-02 ENCOUNTER — Other Ambulatory Visit (INDEPENDENT_AMBULATORY_CARE_PROVIDER_SITE_OTHER): Payer: Self-pay | Admitting: Gastroenterology

## 2022-08-05 DIAGNOSIS — E7800 Pure hypercholesterolemia, unspecified: Secondary | ICD-10-CM | POA: Diagnosis not present

## 2022-08-05 DIAGNOSIS — F419 Anxiety disorder, unspecified: Secondary | ICD-10-CM | POA: Diagnosis not present

## 2022-08-05 DIAGNOSIS — M62830 Muscle spasm of back: Secondary | ICD-10-CM | POA: Diagnosis not present

## 2022-08-05 DIAGNOSIS — M549 Dorsalgia, unspecified: Secondary | ICD-10-CM | POA: Diagnosis not present

## 2022-08-05 DIAGNOSIS — F32A Depression, unspecified: Secondary | ICD-10-CM | POA: Diagnosis not present

## 2022-08-05 DIAGNOSIS — I10 Essential (primary) hypertension: Secondary | ICD-10-CM | POA: Diagnosis not present

## 2022-08-05 DIAGNOSIS — I252 Old myocardial infarction: Secondary | ICD-10-CM | POA: Diagnosis not present

## 2022-08-05 DIAGNOSIS — Z0130 Encounter for examination of blood pressure without abnormal findings: Secondary | ICD-10-CM | POA: Diagnosis not present

## 2022-08-05 DIAGNOSIS — Z6829 Body mass index (BMI) 29.0-29.9, adult: Secondary | ICD-10-CM | POA: Diagnosis not present

## 2022-08-05 DIAGNOSIS — Z79899 Other long term (current) drug therapy: Secondary | ICD-10-CM | POA: Diagnosis not present

## 2022-08-05 DIAGNOSIS — G8929 Other chronic pain: Secondary | ICD-10-CM | POA: Diagnosis not present

## 2022-08-05 DIAGNOSIS — M47816 Spondylosis without myelopathy or radiculopathy, lumbar region: Secondary | ICD-10-CM | POA: Diagnosis not present

## 2022-08-11 ENCOUNTER — Other Ambulatory Visit (INDEPENDENT_AMBULATORY_CARE_PROVIDER_SITE_OTHER): Payer: Self-pay | Admitting: Gastroenterology

## 2022-08-11 NOTE — Telephone Encounter (Signed)
Last seen 07/20/22

## 2022-08-18 ENCOUNTER — Other Ambulatory Visit (INDEPENDENT_AMBULATORY_CARE_PROVIDER_SITE_OTHER): Payer: Self-pay

## 2022-08-18 ENCOUNTER — Encounter (INDEPENDENT_AMBULATORY_CARE_PROVIDER_SITE_OTHER): Payer: Self-pay

## 2022-08-18 DIAGNOSIS — R110 Nausea: Secondary | ICD-10-CM

## 2022-08-18 DIAGNOSIS — R7989 Other specified abnormal findings of blood chemistry: Secondary | ICD-10-CM

## 2022-08-18 DIAGNOSIS — R1013 Epigastric pain: Secondary | ICD-10-CM

## 2022-08-18 DIAGNOSIS — E876 Hypokalemia: Secondary | ICD-10-CM

## 2022-09-08 DIAGNOSIS — R1013 Epigastric pain: Secondary | ICD-10-CM | POA: Diagnosis not present

## 2022-09-08 DIAGNOSIS — R7989 Other specified abnormal findings of blood chemistry: Secondary | ICD-10-CM | POA: Diagnosis not present

## 2022-09-08 DIAGNOSIS — E876 Hypokalemia: Secondary | ICD-10-CM | POA: Diagnosis not present

## 2022-09-08 DIAGNOSIS — R110 Nausea: Secondary | ICD-10-CM | POA: Diagnosis not present

## 2022-09-09 LAB — COMPREHENSIVE METABOLIC PANEL
ALT: 15 IU/L (ref 0–32)
AST: 15 IU/L (ref 0–40)
Albumin/Globulin Ratio: 1.7 (ref 1.2–2.2)
Albumin: 4.2 g/dL (ref 3.8–4.9)
Alkaline Phosphatase: 67 IU/L (ref 44–121)
BUN/Creatinine Ratio: 6 — ABNORMAL LOW (ref 9–23)
BUN: 5 mg/dL — ABNORMAL LOW (ref 6–24)
Bilirubin Total: 0.5 mg/dL (ref 0.0–1.2)
CO2: 24 mmol/L (ref 20–29)
Calcium: 9.2 mg/dL (ref 8.7–10.2)
Chloride: 104 mmol/L (ref 96–106)
Creatinine, Ser: 0.85 mg/dL (ref 0.57–1.00)
Globulin, Total: 2.5 g/dL (ref 1.5–4.5)
Glucose: 113 mg/dL — ABNORMAL HIGH (ref 70–99)
Potassium: 3.2 mmol/L — ABNORMAL LOW (ref 3.5–5.2)
Sodium: 144 mmol/L (ref 134–144)
Total Protein: 6.7 g/dL (ref 6.0–8.5)
eGFR: 83 mL/min/{1.73_m2} (ref >59)

## 2022-09-13 DIAGNOSIS — Z1331 Encounter for screening for depression: Secondary | ICD-10-CM | POA: Diagnosis not present

## 2022-09-13 DIAGNOSIS — F331 Major depressive disorder, recurrent, moderate: Secondary | ICD-10-CM | POA: Diagnosis not present

## 2022-09-13 DIAGNOSIS — F411 Generalized anxiety disorder: Secondary | ICD-10-CM | POA: Diagnosis not present

## 2022-09-13 DIAGNOSIS — Z6829 Body mass index (BMI) 29.0-29.9, adult: Secondary | ICD-10-CM | POA: Diagnosis not present

## 2022-09-13 DIAGNOSIS — F17210 Nicotine dependence, cigarettes, uncomplicated: Secondary | ICD-10-CM | POA: Diagnosis not present

## 2022-10-29 ENCOUNTER — Other Ambulatory Visit: Payer: Self-pay | Admitting: Cardiology

## 2022-11-10 NOTE — Progress Notes (Unsigned)
    Cardiology Office Note  Date: 11/10/2022   ID: Dominique Hinton, DOB 1970/12/09, MRN [id number]  History of Present Illness: Dominique Hinton is a 52 y.o. female last seen in September 2023.  Physical Exam: VS:  There were no vitals taken for this visit., BMI There is no height or weight on file to calculate BMI.  Wt Readings from Last 3 Encounters:  07/20/22 184 lb 4.8 oz (83.6 kg)  05/25/22 185 lb (83.9 kg)  05/18/22 179 lb 14.3 oz (81.6 kg)    General: Patient appears comfortable at rest. HEENT: Conjunctiva and lids normal, oropharynx clear with moist mucosa. Neck: Supple, no elevated JVP or carotid bruits, no thyromegaly. Lungs: Clear to auscultation, nonlabored breathing at rest. Cardiac: Regular rate and rhythm, no S3 or significant systolic murmur, no pericardial rub. Abdomen: Soft, nontender, no hepatomegaly, bowel sounds present, no guarding or rebound. Extremities: No pitting edema, distal pulses 2+. Skin: Warm and dry. Musculoskeletal: No kyphosis. Neuropsychiatric: Alert and oriented x3, affect grossly appropriate.  ECG:  An ECG dated 10/12/2021 was personally reviewed today and demonstrated:  Sinus rhythm.  Labwork: February 2023: Cholesterol 117, triglycerides 252, HDL 29, LDL 48 03/14/2022: TSH 2.770 05/07/2022: Magnesium 1.8 05/25/2022: Hemoglobin 15.4; Platelets 293 09/08/2022: ALT 15; AST 15; BUN 5; Creatinine, Ser 0.85; Potassium 3.2; Sodium 144   Other Studies Reviewed Today:  No interval cardiac testing for review today.  Assessment and Plan:  1.  CAD status post angioplasty of the first diagonal in January 2022.  LVEF 55 to 60%.  2.  Essential hypertension.  3.  Mixed hyperlipidemia, LDL 48 in February 2023.  Disposition:  Follow up {follow up:15908}  Signed, Satira Sark, M.D., F.A.C.C.

## 2022-11-13 ENCOUNTER — Encounter: Payer: Self-pay | Admitting: Cardiology

## 2022-11-13 ENCOUNTER — Ambulatory Visit: Payer: Medicaid Other | Attending: Cardiology | Admitting: Cardiology

## 2022-11-13 VITALS — BP 120/86 | HR 71 | Ht 66.0 in | Wt 180.0 lb

## 2022-11-13 DIAGNOSIS — I25119 Atherosclerotic heart disease of native coronary artery with unspecified angina pectoris: Secondary | ICD-10-CM

## 2022-11-13 DIAGNOSIS — E782 Mixed hyperlipidemia: Secondary | ICD-10-CM | POA: Diagnosis not present

## 2022-11-13 DIAGNOSIS — Z9189 Other specified personal risk factors, not elsewhere classified: Secondary | ICD-10-CM

## 2022-11-13 DIAGNOSIS — I10 Essential (primary) hypertension: Secondary | ICD-10-CM

## 2022-11-13 NOTE — Patient Instructions (Signed)
Medication Instructions:  Your physician recommends that you continue on your current medications as directed. Please refer to the Current Medication list given to you today.   Labwork: None  Testing/Procedures: None  Follow-Up: Follow up with Dr. Domenic Polite in 6 months.   Any Other Special Instructions Will Be Listed Below (If Applicable).  You have been referred to Pulmonology. They will contact you with your first appointment.    If you need a refill on your cardiac medications before your next appointment, please call your pharmacy.

## 2022-11-16 ENCOUNTER — Other Ambulatory Visit: Payer: Self-pay | Admitting: Adult Health

## 2022-11-18 ENCOUNTER — Ambulatory Visit
Admission: EM | Admit: 2022-11-18 | Discharge: 2022-11-18 | Disposition: A | Discharge status: Home / Self Care | Payer: Medicaid Other

## 2022-11-18 DIAGNOSIS — K0889 Other specified disorders of teeth and supporting structures: Secondary | ICD-10-CM | POA: Diagnosis not present

## 2022-11-18 MED ORDER — FLUCONAZOLE 150 MG PO TABS: 150.0000 mg | ORAL_TABLET | Freq: Once | ORAL | 0 refills | Status: AC

## 2022-11-18 MED ORDER — AMOXICILLIN-POT CLAVULANATE 875-125 MG PO TABS: 1.0000 | ORAL_TABLET | Freq: Two times a day (BID) | ORAL | 0 refills | Status: DC

## 2022-11-18 MED ORDER — IBUPROFEN 800 MG PO TABS: 800.0000 mg | ORAL_TABLET | Freq: Three times a day (TID) | ORAL | 0 refills | Status: DC

## 2022-11-18 NOTE — Discharge Instructions (Signed)
  Take medication as prescribed. May take over-the-counter Tylenol extra strength 1 to 2 hours after ibuprofen for breakthrough pain. Warm salt water gargles 3-4 times daily until symptoms improve. Continue warm compresses to the affected area to help with pain and discomfort. I have provided a dental resource guide for you to follow-up with a dentist.  Recommend following up within the next 7 to 10 days.  

## 2022-11-18 NOTE — ED Triage Notes (Signed)
Pt reports dental pain x 3 days. Ibuprofen gives no relief.   Pt needs meds for yeast infection if she will be on antibiotic.

## 2022-11-18 NOTE — ED Provider Notes (Signed)
[facility]    CSN: [id number] Arrival date & time: 11/18/22  1227      History   Chief Complaint Chief Complaint  Patient presents with   Dental Pain    HPI Dominique Hinton is a 52 y.o. female.   The history is provided by the patient.   The patient presents with a 3-day history of dental pain and right lower facial swelling..  Patient states the pain is located in the right lower portion of her mouth.  She states that she has pain with swallowing, and she feels like the pain is radiating into her ear and neck.  She states that she has not had fever, chills, chest pain, abdominal pain, nausea, vomiting, or diarrhea.  Patient states that the tooth is "loose" and is also "groin crooked".  She states she has been taking ibuprofen for pain with minimal relief. Past Medical History:  Diagnosis Date   Anxiety    Arthritis    Back pain    CAD (coronary artery disease)    Angioplasty of D1 January 2022   Essential hypertension    NSTEMI (non-ST elevated myocardial infarction) [facility])    January 2022    Patient Active Problem List   Diagnosis Date Noted   Positive ANA (antinuclear antibody) 07/20/2022   Chronic prescription opiate use 07/20/2022   Gastroparesis 04/18/2022   Abdominal pain, epigastric 04/18/2022   Elevated LFTs 04/18/2022   Tooth infection 03/14/2022   Night sweats 12/13/2021   Irritable bowel syndrome with constipation 10/18/2021   Lower abdominal pain 09/13/2021   Screening for colorectal cancer 09/13/2021   History of MI (myocardial infarction) 08/23/2021   Anxiety and depression 08/23/2021   Pelvic pain 08/23/2021   Hot flashes 08/23/2021   Nausea without vomiting 08/23/2021   Screening mammogram for breast cancer 08/23/2021   S/P hysterectomy 08/23/2021   S/P PTCA (percutaneous transluminal coronary angioplasty)    NSTEMI (non-ST elevated myocardial infarction) ([place]) 09/21/2020   Chest pain 09/20/2020   Obesity (BMI 30.0-34.9)  09/20/2020   Essential hypertension 09/20/2020   Tobacco abuse 09/20/2020   Elevated troponin 09/20/2020   Pain in both upper extremities 06/26/2019   Weakness 06/26/2019    Past Surgical History:  Procedure Laterality Date   ABDOMINAL HYSTERECTOMY     APPENDECTOMY     BIOPSY  10/25/2021   Procedure: BIOPSY;  Surgeon: Harvel Quale, MD;  [facility];  Service: Gastroenterology;;   BIOPSY  05/18/2022   Procedure: BIOPSY;  Surgeon: Harvel Quale, MD;  [facility];  Service: Gastroenterology;;   BREAST SURGERY     CARDIAC CATHETERIZATION  09/21/2020   CHOLECYSTECTOMY     COLONOSCOPY WITH PROPOFOL N/A 10/25/2021   Procedure: COLONOSCOPY WITH PROPOFOL;  Surgeon: Harvel Quale, MD;  [facility];  Service: Gastroenterology;  Laterality: N/A;  105   CORONARY BALLOON ANGIOPLASTY  09/21/2020   CORONARY/GRAFT ACUTE MI REVASCULARIZATION N/A 09/21/2020   Procedure: Coronary/Graft Acute MI Revascularization;  Surgeon: Jettie Booze, MD;  Location: [place] [facility];  Service: Cardiovascular;  Laterality: N/A;   ESOPHAGOGASTRODUODENOSCOPY (EGD) WITH PROPOFOL N/A 05/18/2022   Procedure: ESOPHAGOGASTRODUODENOSCOPY (EGD) WITH PROPOFOL;  Surgeon: Harvel Quale, MD;  [facility];  Service: Gastroenterology;  Laterality: N/A;  930 ASA 3   LEFT HEART CATH AND CORONARY ANGIOGRAPHY N/A 09/21/2020   Procedure: LEFT HEART CATH AND CORONARY ANGIOGRAPHY;  Surgeon: Jettie Booze, MD;  Location: [place] [facility];  Service: Cardiovascular;  Laterality: N/A;   TUBAL LIGATION      OB History     Gravida  4   Para  3   Term  3   Preterm      AB  1   Living         SAB  1   IAB      Ectopic      Multiple      Live Births               Home Medications    Prior to Admission medications   Medication Sig Start Date End Date Taking? Authorizing Provider  amoxicillin-clavulanate  (AUGMENTIN) 875-125 MG tablet Take 1 tablet by mouth every 12 (twelve) hours. 11/18/22  Yes Tra Wilemon-Warren, Alda Lea, NP  fluconazole (DIFLUCAN) 150 MG tablet Take 1 tablet (150 mg total) by mouth once for 1 dose. 11/18/22 11/18/22 Yes Jatorian Renault-Warren, Alda Lea, NP  fluticasone (FLONASE) 50 MCG/ACT nasal spray Place into both nostrils. 11/17/22  Yes [provider]  ibuprofen (ADVIL) 800 MG tablet Take 1 tablet (800 mg total) by mouth 3 (three) times daily. 11/18/22  Yes Jaimi Belle-Warren, Alda Lea, NP  LORazepam (ATIVAN) 1 MG tablet Take 1 mg by mouth 2 (two) times daily as needed. 11/16/22  Yes [provider]  aspirin 81 MG chewable tablet Chew 1 tablet (81 mg total) by mouth daily. 09/22/20   Cheryln Manly, NP  citalopram (CELEXA) 20 MG tablet Take 20 mg by mouth daily.    [provider]  cloNIDine (CATAPRES) 0.2 MG tablet Take 0.2 mg by mouth 2 (two) times daily.    [provider]  dicyclomine (BENTYL) 10 MG capsule TAKE 1 CAPSULE(10 MG) BY MOUTH TWICE DAILY AS NEEDED FOR SPASMS 08/11/22   Montez Morita, Quillian Quince, MD  docusate sodium (COLACE) 100 MG capsule Take 100 mg by mouth 2 (two) times daily.    [provider]  Fexofenadine HCl (ALLEGRA PO) Take 180 mg by mouth daily.    [provider]  gabapentin (NEURONTIN) 400 MG capsule Take 400 mg by mouth 3 (three) times daily as needed (pain). 08/19/20   [provider]  hydrochlorothiazide (HYDRODIURIL) 25 MG tablet TAKE 1/2 TABLET BY MOUTH DAILY 10/30/22   Satira Sark, MD  hypromellose (SYSTANE OVERNIGHT THERAPY) 0.3 % GEL ophthalmic ointment Place 1 Application into both eyes at bedtime.    [provider]  linaclotide Rolan Lipa) 72 MCG capsule Take 1 capsule (72 mcg total) by mouth daily before breakfast. 07/20/22   Montez Morita, Quillian Quince, MD  losartan (COZAAR) 50 MG tablet Take 1 tablet (50 mg total) by mouth daily. 05/05/22 05/05/23  Satira Sark, MD   metoCLOPramide (REGLAN) 10 MG tablet TAKE 1/2 TABLET(5 MG) BY MOUTH FOUR TIMES DAILY- BEFORE MEALS AND AT BEDTIME Patient not taking: Reported on 11/13/2022 08/02/22   Montez Morita, Quillian Quince, MD  nitroGLYCERIN (NITROSTAT) 0.4 MG SL tablet PLACE 1 TABLET (0.4 MG TOTAL) UNDER THE TONGUE EVERY FIVE MINUTES AS NEEDED FOR CHEST PAIN. 05/05/22 05/05/23  Satira Sark, MD  Omega-3 Fatty Acids (FISH OIL) 1000 MG CAPS Take by mouth daily.    [provider]  OVER THE COUNTER MEDICATION Take 1 capsule by mouth in the morning and at bedtime. Women's Natural Transition    [provider]  oxyCODONE-acetaminophen (PERCOCET/ROXICET) 5-325 MG tablet Take by mouth every 8 (eight) hours as needed for severe pain. Patient not taking: Reported on 11/13/2022  [provider]  promethazine (PHENERGAN) 25 MG tablet TAKE 1 TABLET(25 MG) BY MOUTH EVERY 6 HOURS AS NEEDED 11/16/22   Derrek Monaco A, NP  rosuvastatin (CRESTOR) 20 MG tablet TAKE 1 TABLET BY MOUTH EVERY DAY AT [facility] 01/10/22   Satira Sark, MD  tiZANidine (ZANAFLEX) 4 MG tablet Take 4-8 mg by mouth See admin instructions. Take 4 mg by mouth in the day and take 8 mg at bedtime 09/16/20   [provider]  UNABLE TO FIND Take 1,500 mg by mouth daily. Soy capsules    [provider]  valACYclovir (VALTREX) 1000 MG tablet Take 1,000 mg by mouth daily. 05/06/19   [provider]  Vitamin E 670 MG (1000 UT) CAPS Take by mouth daily.    [provider]    Family History Family History  Problem Relation Age of Onset   Alzheimer's disease Father    Hypertension Mother    Other Mother        "borderline diabetic"   Dementia Mother    Thyroid disease Mother    Thyroid disease Brother    Anxiety disorder Daughter    Depression Daughter    Depression Daughter    Anxiety disorder Daughter    ODD Son    ADD / ADHD Son     Social History Social History   Tobacco Use   Smoking status:  Every Day    Packs/day: 1.00    Years: 30.00    Additional pack years: 0.00    Total pack years: 30.00    Types: Cigarettes    Passive exposure: Current   Smokeless tobacco: Never  Vaping Use   Vaping Use: Never used  Substance Use Topics   Alcohol use: Yes    Comment: occasionally   Drug use: Yes    Types: Marijuana    Comment: daily     Allergies   Cortisone and Doxycycline   Review of Systems Review of Systems Per HPI  Physical Exam Triage Vital Signs ED Triage Vitals [11/18/22 1237]  Enc Vitals Group     BP 116/84     Pulse Rate 93     Resp 18     Temp 98.5 F (36.9 C)     Temp Source Oral     SpO2 98 %     Weight      Height      Head Circumference      Peak Flow      Pain Score      Pain Loc      Pain Edu?      Excl. in [place]?    No data found.  Updated Vital Signs BP 116/84 (BP Location: Right Arm)   Pulse 93   Temp 98.5 F (36.9 C) (Oral)   Resp 18   SpO2 98%   Visual Acuity Right Eye Distance:   Left Eye Distance:   Bilateral Distance:    Right Eye Near:   Left Eye Near:    Bilateral Near:     Physical Exam Vitals and nursing note reviewed.  Constitutional:      General: She is not in acute distress.    Appearance: Normal appearance.  HENT:     Mouth/Throat:     Lips: Pink.     Mouth: Mucous membranes are moist.     Dentition: Abnormal dentition. Dental tenderness, gingival swelling and dental abscesses present. No dental caries.     Pharynx: Oropharynx is clear. Uvula  midline.      Comments: Tooth #31 with tenderness and gingival swelling.  Tooth is loose. Eyes:     Extraocular Movements: Extraocular movements intact.     Pupils: Pupils are equal, round, and reactive to light.  Pulmonary:     Effort: Pulmonary effort is normal.  Musculoskeletal:     Cervical back: Normal range of motion.  Lymphadenopathy:     Cervical: No cervical adenopathy.  Skin:    General: Skin is warm and dry.  Neurological:     General: No focal  deficit present.     Mental Status: She is alert and oriented to person, place, and time.  Psychiatric:        Mood and Affect: Mood normal.        Behavior: Behavior normal.      UC Treatments / Results  Labs (all labs ordered are listed, but only abnormal results are displayed) Labs Reviewed - No data to display  EKG   Radiology No results found.  Procedures Procedures (including critical care time)  Medications Ordered in UC Medications - No data to display  Initial Impression / Assessment and Plan / UC Course  I have reviewed the triage vital signs and the nursing notes.  Pertinent labs & imaging results that were available during my care of the patient were reviewed by me and considered in my medical decision making (see chart for details).  Is well-appearing, she is in no acute distress, vital signs are stable.  Patient presents for complaints dental pain with facial swelling and tenderness to tooth #31.  Will consider dental abscess in the presence of facial swelling.  Admission 875/125 mg twice daily for the next 7 days was prescribed, and for her pain, ibuprofen 800 mg 3 times daily was prescribed for pain.  Patient was advised to start Tylenol in conjunction with ibuprofen to help with pain control.  Supportive care recommendations were provided to the patient.  Dental resource guide was also provided to the patient and encourage follow-up with a dentist within the next 7 to 10 days.  Patient advised to follow-up as needed.   Final Clinical Impressions(s) / UC Diagnoses   Final diagnoses:  Dentalgia     Discharge Instructions         Take medication as prescribed. May take over-the-counter Tylenol extra strength 1 to 2 hours after ibuprofen for breakthrough pain. Warm salt water gargles 3-4 times daily until symptoms improve. Continue warm compresses to the affected area to help with pain and discomfort. I have provided a dental resource guide for you to  follow-up with a dentist.  Recommend following up within the next 7 to 10 days.      ED Prescriptions     Medication Sig Dispense Auth. Provider   amoxicillin-clavulanate (AUGMENTIN) 875-125 MG tablet Take 1 tablet by mouth every 12 (twelve) hours. 14 tablet Litsy Epting-Warren, Alda Lea, NP   ibuprofen (ADVIL) 800 MG tablet Take 1 tablet (800 mg total) by mouth 3 (three) times daily. 21 tablet Daryan Cagley-Warren, Alda Lea, NP   fluconazole (DIFLUCAN) 150 MG tablet Take 1 tablet (150 mg total) by mouth once for 1 dose. 1 tablet Kyrielle Urbanski-Warren, Alda Lea, NP      PDMP not reviewed this encounter.   Tish Men, NP 11/18/22 1317

## 2022-11-20 ENCOUNTER — Ambulatory Visit (INDEPENDENT_AMBULATORY_CARE_PROVIDER_SITE_OTHER): Payer: Medicaid Other | Admitting: Gastroenterology

## 2022-11-20 ENCOUNTER — Encounter (INDEPENDENT_AMBULATORY_CARE_PROVIDER_SITE_OTHER): Payer: Self-pay | Admitting: Gastroenterology

## 2022-11-20 VITALS — BP 134/90 | HR 74 | Temp 98.7°F | Ht 66.0 in | Wt 180.0 lb

## 2022-11-20 DIAGNOSIS — K581 Irritable bowel syndrome with constipation: Secondary | ICD-10-CM

## 2022-11-20 DIAGNOSIS — K3184 Gastroparesis: Secondary | ICD-10-CM | POA: Diagnosis not present

## 2022-11-20 NOTE — Progress Notes (Signed)
Dominique Hinton, M.D. Gastroenterology & Hepatology [facility] [address] [place], [place] [address]  Primary Care Physician: Asencion Noble, MD [address] [place] [address]  I will communicate my assessment and recommendations to the referring MD via EMR.  Problems: Gastroparesis IBS-C  History of Present Illness: Dominique Hinton is a 52 y.o. female with past medical history of gastroparesis, anxiety, arthritis, back pain, CAD, HTN, NSTEMI (2022), IVC, who presents for follow up of gastroparesis and IBS-C.  The patient was last seen on 07/20/2022. At that time, the patient was advised to continue Reglan 5 mg to 3 times a day and to take Bentyl as needed for abdominal pain.  She was also advised to take Linzess 72 mcg daily.  Repeat CMP was performed which showed normalization of LFTs.  Patient reports she tried taking Reglan two times a day, but she reports that she did not feel any improvement with this medication. She stopped using Reglan due to this, as she was having significant bloating and pain in her abdomen despite trying to be regular with the medication intake. However, she states that she restarted using phenergan and felt this medication relieved effectively the bloating sensation. Takes Phenergan possibly once a day, but sometimes she does not take it.  She states that she has been taking dicyclomine BID to relieve her abdominal pain in the lower abdomen and has found this medication is useful for this.  Patient was taking Percocet twice a day, but has been off of the pain medication recently as there are some restrictions by DEA with the use of opiates and if using cannabinoids.  She states that she was having episodes of fecal seepage and accidents for 3 days after starting Linzess. Due to this, she stopped using it. Now she is taking Dulcolax once a week. Is having a bowel movement 2-3 times a week.  Does not  strain to move her bowels.  Has a rheumatology appointment on 02/08/23.  The patient denies having any nausea, vomiting, fever, chills, hematochezia, melena, hematemesis, diarrhea, jaundice, pruritus or weight loss.  Last EGD:05/18/2022 Normal esophagus, stomach and small bowel.   Path: A. SMALL BOWEL, BIOPSY:  - Small intestinal mucosa with no specific histopathologic changes  - Negative for increased intraepithelial lymphocytes or villous  architectural changes   B. STOMACH, BIOPSY:  - Gastric antral mucosa with mild nonspecific reactive gastropathy  - Gastric oxyntic mucosa with no specific histopathologic changes  - Helicobacter pylori-like organisms are not identified on routine HE  stain    Last Colonoscopy: 10/25/2021 The perianal and digital rectal examinations were normal. There was a small lipoma, in the ascending colon. Localized areas of mildly congested and erythematous mucosa was found in the sigmoid colon (? SCAD). Biopsies were taken with a cold forceps for histology. The retroflexed view of the distal rectum and anal verge was normal and showed no anal or rectal abnormalities.   Path: FINAL MICROSCOPIC DIAGNOSIS:   A. COLON, RANDOM, SIGMOID, BIOPSY:      - Healed ulcer with dilated blood vessels.      - No evidence of microscopic colitis, active colitis, dysplasia or  malignancy.    Recommended repeat in 10 years  Past Medical History: Past Medical History:  Diagnosis Date   Anxiety    Arthritis    Back pain    CAD (coronary artery disease)    Angioplasty of D1 January 2022   Essential hypertension  NSTEMI (non-ST elevated myocardial infarction) [facility])    January 2022    Past Surgical History: Past Surgical History:  Procedure Laterality Date   ABDOMINAL HYSTERECTOMY     APPENDECTOMY     BIOPSY  10/25/2021   Procedure: BIOPSY;  Surgeon: Harvel Quale, MD;  [facility];  Service: Gastroenterology;;   BIOPSY  05/18/2022    Procedure: BIOPSY;  Surgeon: Harvel Quale, MD;  [facility];  Service: Gastroenterology;;   BREAST SURGERY     CARDIAC CATHETERIZATION  09/21/2020   CHOLECYSTECTOMY     COLONOSCOPY WITH PROPOFOL N/A 10/25/2021   Procedure: COLONOSCOPY WITH PROPOFOL;  Surgeon: Harvel Quale, MD;  [facility];  Service: Gastroenterology;  Laterality: N/A;  105   CORONARY BALLOON ANGIOPLASTY  09/21/2020   CORONARY/GRAFT ACUTE MI REVASCULARIZATION N/A 09/21/2020   Procedure: Coronary/Graft Acute MI Revascularization;  Surgeon: Jettie Booze, MD;  Location: [place] [facility];  Service: Cardiovascular;  Laterality: N/A;   ESOPHAGOGASTRODUODENOSCOPY (EGD) WITH PROPOFOL N/A 05/18/2022   Procedure: ESOPHAGOGASTRODUODENOSCOPY (EGD) WITH PROPOFOL;  Surgeon: Harvel Quale, MD;  [facility];  Service: Gastroenterology;  Laterality: N/A;  930 ASA 3   LEFT HEART CATH AND CORONARY ANGIOGRAPHY N/A 09/21/2020   Procedure: LEFT HEART CATH AND CORONARY ANGIOGRAPHY;  Surgeon: Jettie Booze, MD;  Location: [place] [facility];  Service: Cardiovascular;  Laterality: N/A;   TUBAL LIGATION      Family History: Family History  Problem Relation Age of Onset   Alzheimer's disease Father    Hypertension Mother    Other Mother        "borderline diabetic"   Dementia Mother    Thyroid disease Mother    Thyroid disease Brother    Anxiety disorder Daughter    Depression Daughter    Depression Daughter    Anxiety disorder Daughter    ODD Son    ADD / ADHD Son     Social History: Social History   Tobacco Use  Smoking Status Every Day   Packs/day: 1.00   Years: 30.00   Additional pack years: 0.00   Total pack years: 30.00   Types: Cigarettes   Passive exposure: Current  Smokeless Tobacco Never   Social History   Substance and Sexual Activity  Alcohol Use Yes   Comment: occasionally   Social History   Substance and Sexual Activity   Drug Use Yes   Types: Marijuana   Comment: daily    Allergies: Allergies  Allergen Reactions   Cortisone Shortness Of Breath and Other (See Comments)    Made me feel loopy   Doxycycline Rash    Medications: Current Outpatient Medications  Medication Sig Dispense Refill   amoxicillin-clavulanate (AUGMENTIN) 875-125 MG tablet Take 1 tablet by mouth every 12 (twelve) hours. 14 tablet 0   aspirin 81 MG chewable tablet Chew 1 tablet (81 mg total) by mouth daily. 90 tablet 1   bisacodyl (DULCOLAX) 5 MG EC tablet Take 5 mg by mouth daily as needed for moderate constipation.     citalopram (CELEXA) 20 MG tablet Take 20 mg by mouth daily.     cloNIDine (CATAPRES) 0.2 MG tablet Take 0.2 mg by mouth. One at bedtime     dicyclomine (BENTYL) 10 MG capsule TAKE 1 CAPSULE(10 MG) BY MOUTH TWICE DAILY AS NEEDED FOR SPASMS 90 capsule 3   Fexofenadine HCl (ALLEGRA PO) Take 180 mg by mouth daily.     fluticasone (FLONASE)  50 MCG/ACT nasal spray Place into both nostrils.     gabapentin (NEURONTIN) 400 MG capsule Take 400 mg by mouth 3 (three) times daily as needed (pain).     hydrochlorothiazide (HYDRODIURIL) 25 MG tablet TAKE 1/2 TABLET BY MOUTH DAILY 45 tablet 3   hypromellose (SYSTANE OVERNIGHT THERAPY) 0.3 % GEL ophthalmic ointment Place 1 Application into both eyes at bedtime.     ibuprofen (ADVIL) 800 MG tablet Take 1 tablet (800 mg total) by mouth 3 (three) times daily. 21 tablet 0   LORazepam (ATIVAN) 1 MG tablet Take 1 mg by mouth 2 (two) times daily as needed.     losartan (COZAAR) 50 MG tablet Take 1 tablet (50 mg total) by mouth daily. 90 tablet 3   nitroGLYCERIN (NITROSTAT) 0.4 MG SL tablet PLACE 1 TABLET (0.4 MG TOTAL) UNDER THE TONGUE EVERY FIVE MINUTES AS NEEDED FOR CHEST PAIN. 25 tablet 2   Omega-3 Fatty Acids (FISH OIL) 1000 MG CAPS Take by mouth daily.     OVER THE COUNTER MEDICATION Take 1 capsule by mouth in the morning and at bedtime. Women's Natural Transition      oxyCODONE-acetaminophen (PERCOCET/ROXICET) 5-325 MG tablet Take by mouth every 8 (eight) hours as needed for severe pain.     promethazine (PHENERGAN) 25 MG tablet TAKE 1 TABLET(25 MG) BY MOUTH EVERY 6 HOURS AS NEEDED 30 tablet 0   rosuvastatin (CRESTOR) 20 MG tablet TAKE 1 TABLET BY MOUTH EVERY DAY AT 6PM 90 tablet 3   tiZANidine (ZANAFLEX) 4 MG tablet Take 4-8 mg by mouth See admin instructions. Take 4 mg by mouth in the day and take 8 mg at bedtime     UNABLE TO FIND Take 1,500 mg by mouth daily. Soy capsules     valACYclovir (VALTREX) 1000 MG tablet Take 1,000 mg by mouth daily.     Vilazodone HCl (VIIBRYD PO) Take by mouth. 10mg  daily     Vitamin E 670 MG (1000 UT) CAPS Take by mouth daily.     No current facility-administered medications for this visit.    Review of Systems: GENERAL: negative for malaise, night sweats HEENT: No changes in hearing or vision, no nose bleeds or other nasal problems. NECK: Negative for lumps, goiter, pain and significant neck swelling RESPIRATORY: Negative for cough, wheezing CARDIOVASCULAR: Negative for chest pain, leg swelling, palpitations, orthopnea GI: SEE HPI MUSCULOSKELETAL: Negative for joint pain or swelling, back pain, and muscle pain. SKIN: Negative for lesions, rash PSYCH: Negative for sleep disturbance, mood disorder and recent psychosocial stressors. HEMATOLOGY Negative for prolonged bleeding, bruising easily, and swollen nodes. ENDOCRINE: Negative for cold or heat intolerance, polyuria, polydipsia and goiter. NEURO: negative for tremor, gait imbalance, syncope and seizures. The remainder of the review of systems is noncontributory.   Physical Exam: BP (!) 134/90 (BP Location: Left Arm, Patient Position: Sitting, Cuff Size: Large)   Pulse 74   Temp 98.7 F (37.1 C) (Oral)   Ht 5\' 6"  (1.676 m)   Wt 180 lb (81.6 kg)   BMI 29.05 kg/m  GENERAL: The patient is AO x3, in no acute distress. HEENT: Head is normocephalic and atraumatic.  EOMI are intact. Mouth is well hydrated and without lesions. NECK: Supple. No masses LUNGS: Clear to auscultation. No presence of rhonchi/wheezing/rales. Adequate chest expansion HEART: RRR, normal s1 and s2. ABDOMEN: tender to palpation in the R side of the abdomen, no guarding, no peritoneal signs, and nondistended. BS +. No masses. EXTREMITIES: Without any cyanosis, clubbing, rash,  lesions or edema. NEUROLOGIC: AOx3, no focal motor deficit. SKIN: no jaundice, no rashes  Imaging/Labs: as above  I personally reviewed and interpreted the available labs, imaging and endoscopic files.  Impression and Plan: Dominique Hinton is a 52 y.o. female with past medical history of gastroparesis, anxiety, arthritis, back pain, CAD, HTN, NSTEMI (2022), IVC, who presents for follow up of gastroparesis and IBS-C.  The patient has presented improvement of her gastroparesis symptoms while taking Phenergan as needed.  She did not have significant improvement with use of Reglan, which she should discontinue.  It seems that the opiates have not driven her disease primarily as she has not felt any significant relief after recently stopping these medications.  However, they could be contributing to part of her dysmotility.  For now, she will continue with the use of Phenergan as needed.  Her IBS-C seems to be partially responding to as needed laxatives.  I advised her to take MiraLAX on a regular basis and to use rescue doses of Dulcolax if presenting significant constipation.  She can continue with the use of dicyclomine to relieve her abdominal pain episodes.  -Continue Phenergan 25 mg every 8 hours as needed for bloating and feeling abdominal fullness - Continue dicyclomine 10 mg every 12 hours as needed for abdominal pain - Start taking Miralax 1 capful every day , can take rescue doses of Dulcolax as needed for constipation  All questions were answered.      Dominique Peppers, MD Gastroenterology and  Hepatology [facility]

## 2022-11-20 NOTE — Patient Instructions (Signed)
Continue Phenergan 25 mg every 8 hours as needed for bloating and feeling abdominal fullness Continue dicyclomine 10 mg every 12 hours as needed for abdominal pain Start taking Miralax 1 capful every day , can take rescue doses of Dulcolax as needed for constipation

## 2022-12-04 ENCOUNTER — Encounter (HOSPITAL_BASED_OUTPATIENT_CLINIC_OR_DEPARTMENT_OTHER): Payer: Self-pay | Admitting: Pulmonary Disease

## 2022-12-04 ENCOUNTER — Ambulatory Visit (INDEPENDENT_AMBULATORY_CARE_PROVIDER_SITE_OTHER): Payer: Medicaid Other | Admitting: Pulmonary Disease

## 2022-12-04 VITALS — BP 124/80 | HR 94 | Temp 99.0°F | Ht 66.0 in | Wt 184.2 lb

## 2022-12-04 DIAGNOSIS — R0683 Snoring: Secondary | ICD-10-CM | POA: Diagnosis not present

## 2022-12-04 DIAGNOSIS — G2581 Restless legs syndrome: Secondary | ICD-10-CM | POA: Diagnosis not present

## 2022-12-04 DIAGNOSIS — Z720 Tobacco use: Secondary | ICD-10-CM | POA: Diagnosis not present

## 2022-12-04 NOTE — Patient Instructions (Signed)
X split night study @ [place] 

## 2022-12-04 NOTE — Progress Notes (Signed)
Subjective:    Patient ID: Dominique Hinton, female    DOB: Jan 22, 1971, 52 y.o.   MRN: [id number]  HPI   52 year old smoker referred by cardiology for evaluation of OSA and restless leg syndrome. She smokes about a pack per day, more than 30 pack years She admits to smoking weed  PMH - gastroparesis, anxiety, arthritis, back pain, CAD, HTN, NSTEMI (2022),  -elevated LFTs possibly related to drug-induced liver injury, on liver biopsy-workup showed normal iron stores, normal alpha-1 antitrypsin, positive ANA with a titer of 1:320, negative anti-smooth muscle antibodies, with normal ceruloplasmin of 29, negative acute hepatitis panel.  AMA was 51.   Loud snoring has been noted by family members.  She never feels rested.  She reports that both her legs ache feels like she has a torn Account all the time and at night she feels her legs jumping around and waking her up from sleep. Epworth Sleepiness Scale is 11 and she reports sleepiness while watching TV, lying down to rest in the afternoons are sitting and reading.  Bedtime is between 9 and 11 PM sleep latency is about 30 minutes, she sleeps on his side with 1 pillow, reports 3-4 nocturnal awakenings and is out of bed by 9 AM liters, with dryness of mouth and occasional headache. She reports episodes of incontinence when she took lorazepam together with gabapentin and muscle relaxant  There is no history suggestive of cataplexy, sleep paralysis or parasomnias  Labs-iron/TIBC 96/303= 32%     Past Medical History:  Diagnosis Date   Anxiety    Arthritis    Back pain    CAD (coronary artery disease)    Angioplasty of D1 January 2022   Essential hypertension    NSTEMI (non-ST elevated myocardial infarction)    January 2022   Past Surgical History:  Procedure Laterality Date   ABDOMINAL HYSTERECTOMY     APPENDECTOMY     BIOPSY  10/25/2021   Procedure: BIOPSY;  Surgeon: Harvel Quale, MD;  [facility]: [facility];   Service: Gastroenterology;;   BIOPSY  05/18/2022   Procedure: BIOPSY;  Surgeon: Harvel Quale, MD;  [facility]: [facility];  Service: Gastroenterology;;   BREAST SURGERY     CARDIAC CATHETERIZATION  09/21/2020   CHOLECYSTECTOMY     COLONOSCOPY WITH PROPOFOL N/A 10/25/2021   Procedure: COLONOSCOPY WITH PROPOFOL;  Surgeon: Harvel Quale, MD;  [facility]: [facility];  Service: Gastroenterology;  Laterality: N/A;  105   CORONARY BALLOON ANGIOPLASTY  09/21/2020   CORONARY/GRAFT ACUTE MI REVASCULARIZATION N/A 09/21/2020   Procedure: Coronary/Graft Acute MI Revascularization;  Surgeon: Jettie Booze, MD;  Location: [facility];  Service: Cardiovascular;  Laterality: N/A;   ESOPHAGOGASTRODUODENOSCOPY (EGD) WITH PROPOFOL N/A 05/18/2022   Procedure: ESOPHAGOGASTRODUODENOSCOPY (EGD) WITH PROPOFOL;  Surgeon: Harvel Quale, MD;  [facility]: [facility];  Service: Gastroenterology;  Laterality: N/A;  930 ASA 3   LEFT HEART CATH AND CORONARY ANGIOGRAPHY N/A 09/21/2020   Procedure: LEFT HEART CATH AND CORONARY ANGIOGRAPHY;  Surgeon: Jettie Booze, MD;  Location: [facility];  Service: Cardiovascular;  Laterality: N/A;   TUBAL LIGATION      Allergies  Allergen Reactions   Cortisone Shortness Of Breath and Other (See Comments)    Made me feel loopy   Doxycycline Rash   Social History   Socioeconomic History   Marital status: Widowed    Spouse name: Not on file   Number of children: 3  Years of education: 51   Highest education level: Not on file  Occupational History   Occupation: waitress/cook  Tobacco Use   Smoking status: Every Day    Packs/day: 1.00    Years: 30.00    Additional pack years: 0.00    Total pack years: 30.00    Types: Cigarettes    Passive exposure: Current   Smokeless tobacco: Never   Tobacco comments:    Started smoking at age 36  Vaping Use   Vaping Use: Never used  Substance and Sexual Activity   Alcohol  use: Yes    Comment: occasionally   Drug use: Yes    Types: Marijuana    Comment: daily   Sexual activity: Yes    Birth control/protection: Surgical    Comment: hyst  Other Topics Concern   Not on file  Social History Narrative   Lives at home with daughter, Joslyn Hy and fiance.   10-12 cups caffeine per day.   Right-handed.   Social Determinants of Health   Financial Resource Strain: Medium Risk (08/23/2021)   Overall Financial Resource Strain (CARDIA)    Difficulty of Paying Living Expenses: Somewhat hard  Food Insecurity: Food Insecurity Present (08/23/2021)   Hunger Vital Sign    Worried About Running Out of Food in the Last Year: Sometimes true    Ran Out of Food in the Last Year: Never true  Transportation Needs: No Transportation Needs (08/23/2021)   PRAPARE - Hydrologist (Medical): No    Lack of Transportation (Non-Medical): No  Physical Activity: Insufficiently Active (08/23/2021)   Exercise Vital Sign    Days of Exercise per Week: 2 days    Minutes of Exercise per Session: 10 min  Stress: Stress Concern Present (08/23/2021)   [place]    Feeling of Stress : To some extent  Social Connections: Moderately Isolated (08/23/2021)   Social Connection and Isolation Panel [NHANES]    Frequency of Communication with Friends and Family: More than three times a week    Frequency of Social Gatherings with Friends and Family: Once a week    Attends Religious Services: Never    Marine scientist or Organizations: No    Attends Archivist Meetings: Never    Marital Status: Living with partner  Intimate Partner Violence: Not At Risk (08/23/2021)   Humiliation, Afraid, Rape, and Kick questionnaire    Fear of Current or Ex-Partner: No    Emotionally Abused: No    Physically Abused: No    Sexually Abused: No    Family History  Problem Relation Age of Onset    Alzheimer's disease Father    Hypertension Mother    Other Mother        "borderline diabetic"   Dementia Mother    Thyroid disease Mother    Thyroid disease Brother    Anxiety disorder Daughter    Depression Daughter    Depression Daughter    Anxiety disorder Daughter    ODD Son    ADD / ADHD Son       Review of Systems  Tooth problems Occasional headaches Anxiety and depression  Constitutional: negative for anorexia, fevers and sweats  Eyes: negative for irritation, redness and visual disturbance  Ears, nose, mouth, throat, and face: negative for earaches, epistaxis, nasal congestion and sore throat  Respiratory: negative for cough, dyspnea on exertion, sputum and wheezing  Cardiovascular: negative for chest pain,  dyspnea, lower extremity edema, orthopnea, palpitations and syncope  Gastrointestinal: negative for abdominal pain, constipation, diarrhea, melena, nausea and vomiting  Genitourinary:negative for dysuria, frequency and hematuria  Hematologic/lymphatic: negative for bleeding, easy bruising and lymphadenopathy  Musculoskeletal:negative for arthralgias, muscle weakness and stiff joints  Neurological: negative for coordination problems, gait problems and weakness  Endocrine: negative for diabetic symptoms including polydipsia, polyuria and weight loss     Objective:   Physical Exam  Gen. Pleasant, well-nourished, in no distress, normal affect ENT - no pallor,icterus, no post nasal drip Neck: No JVD, no thyromegaly, no carotid bruits Lungs: no use of accessory muscles, no dullness to percussion, clear without rales or rhonchi  Cardiovascular: Rhythm regular, heart sounds  normal, no murmurs or gallops, no peripheral edema Abdomen: soft and non-tender, no hepatosplenomegaly, BS normal. Musculoskeletal: No deformities, no cyanosis or clubbing Neuro:  alert, non focal       Assessment & Plan:    OSA - Given excessive daytime somnolence, narrow pharyngeal  exam, witnessed apneas & loud snoring, obstructive sleep apnea is very likely & an overnight polysomnogram will be scheduled as a split study. The pathophysiology of obstructive sleep apnea , it's cardiovascular consequences & modes of treatment including CPAP were discused with the patient in detail & they evidenced understanding.   Restless leg syndrome -her symptoms of aching legs and legs jumping around and waking her up from sleep is very suggestive of restless legs.  If sleep study does not show significant OSA then we will consider treatment for restless legs with dopamine agonist.  She is already on gabapentin.  I have asked her to discontinue lorazepam given episodes of incontinence that she described

## 2022-12-04 NOTE — Assessment & Plan Note (Signed)
Smoking cessation emphasized is the most important intervention that would add years to her life. Ordered lung cancer screening program in [place]

## 2023-01-10 ENCOUNTER — Telehealth: Payer: Self-pay | Admitting: Cardiology

## 2023-01-10 DIAGNOSIS — R002 Palpitations: Secondary | ICD-10-CM

## 2023-01-10 MED ORDER — HYDROCHLOROTHIAZIDE 25 MG PO TABS: 25.0000 mg | ORAL_TABLET | Freq: Every day | ORAL | 3 refills | Status: DC

## 2023-01-10 NOTE — Telephone Encounter (Signed)
   Per Dr.McDowell, patient may increase HCTZ to 25 mg qd, patient agrees with dispo.  E-scribed to [employer]

## 2023-01-10 NOTE — Telephone Encounter (Signed)
Patient states that for the last few weeks she has noted feet and ankle swelling. Awakens in the am w/o swelling,by the end of the day feet swollen. Denies salt use. She is weighed once a month at [facility] and was 184 lbs on 12/25/22  She says she elevates her legs when sitting, has not tried compression stockings.   Denies SOB,CP,etc. She does not monitor BP at home. She asks if she can increase her HCTZ.

## 2023-01-10 NOTE — Telephone Encounter (Signed)
Pt c/o swelling: STAT is pt has developed SOB within 24 hours  If swelling, where is the swelling located? LE  How much weight have you gained and in what time span? 3 or 4 lbs  Have you gained 3 pounds in a day or 5 pounds in a week? Yes   Do you have a log of your daily weights (if so, list)? No   Are you currently taking a fluid pill? Yes   Are you currently SOB? No   Have you traveled recently? No

## 2023-01-15 ENCOUNTER — Other Ambulatory Visit: Payer: Self-pay | Admitting: Adult Health

## 2023-01-15 MED ORDER — PROMETHAZINE HCL 25 MG PO TABS: 25.0000 mg | ORAL_TABLET | Freq: Three times a day (TID) | ORAL | 1 refills | Status: DC | PRN

## 2023-01-15 NOTE — Telephone Encounter (Signed)
From: Zada Finders To: Office of Cyril Mourning, [place] Sent: 01/12/2023 4:08 PM EDT Subject: Medication Renewal Request  Refills have been requested for the following medications:   promethazine (PHENERGAN) 25 MG tablet [Ciclaly Mulcahey]  Preferred pharmacy: [facility] [phone] - [place], [place] - [address] AT [facility] [address] Delivery method: Pickup

## 2023-01-15 NOTE — Telephone Encounter (Signed)
Refilled phenergan

## 2023-01-25 NOTE — Progress Notes (Signed)
Office Visit Note  Patient: Dominique Hinton             Date of Birth: Mar 20, 1971           MRN: [id number]             PCP: Carylon Perches, MD Referring: Marguerita Merles, Valentino Saxon* Visit Date: 02/08/2023 Occupation: @GUAROCC @  Subjective:  Positive ANA  History of Present Illness: Dominique Hinton is a 52 y.o. female seen in consultation per request of her gastroenterologist for evaluation of positive ANA.  Patient states she has had abdominal discomfort for at least 12 years.  She was evaluated by Dr. Levon Hedger for elevated liver function recently.  She states she had an extensive workup.  The labs showed positive ANA and for that reason she was referred to me.  Her liver functions eventually normalized.  She also has diagnosis of IBS and gastroparesis.  She complains of some discomfort in her neck and chronic pain and discomfort in lower back.  She states her lower back pain is severe and she goes to pain management.  She has had several injections in her back over the years.  She also had nerve ablation a year ago which is wearing out and she will need repeat ablation procedure.  She has had some pedal edema which causes discomfort in her feet.  The discomfort gets better once the edema resolves.  None of the other joints are painful or swollen.  There is no history of oral ulcers, nasal ulcers, malar rash, photosensitivity, Raynaud's, inflammatory arthritis or lymphadenopathy.  She gives history of dry mouth and dry eyes.  She is gravida 4, para 3, miscarriages 1.  There is no history of DVTs.  There is no family history of autoimmune disease.    Activities of Daily Living:  Patient reports morning stiffness for all day. Patient Reports nocturnal pain.  Difficulty dressing/grooming: Denies Difficulty climbing stairs: Denies Difficulty getting out of chair: Denies Difficulty using hands for taps, buttons, cutlery, and/or writing: Reports  Review of Systems  Constitutional:  Positive for  fatigue.  HENT:  Positive for mouth dryness. Negative for mouth sores.   Eyes:  Positive for dryness.  Respiratory:  Negative for shortness of breath.   Cardiovascular:  Positive for swelling in legs/feet. Negative for chest pain and palpitations.  Gastrointestinal:  Positive for abdominal pain, constipation, diarrhea and nausea. Negative for blood in stool.  Endocrine: Negative for increased urination.  Genitourinary:  Positive for involuntary urination.  Musculoskeletal:  Positive for joint pain, gait problem, joint pain, myalgias, morning stiffness, muscle tenderness and myalgias. Negative for joint swelling and muscle weakness.  Skin:  Negative for color change, rash, hair loss and sensitivity to sunlight.  Allergic/Immunologic: Negative for susceptible to infections.  Neurological:  Positive for dizziness, numbness, headaches and parasthesias.  Hematological:  Negative for swollen glands.  Psychiatric/Behavioral:  Positive for depressed mood and sleep disturbance. The patient is nervous/anxious.     PMFS History:  Patient Active Problem List   Diagnosis Date Noted   Positive ANA (antinuclear antibody) 07/20/2022   Chronic prescription opiate use 07/20/2022   Gastroparesis 04/18/2022   Abdominal pain, epigastric 04/18/2022   Elevated LFTs 04/18/2022   Tooth infection 03/14/2022   Night sweats 12/13/2021   Irritable bowel syndrome with constipation 10/18/2021   Lower abdominal pain 09/13/2021   Screening for colorectal cancer 09/13/2021   History of MI (myocardial infarction) 08/23/2021   Anxiety and depression 08/23/2021   Pelvic pain  08/23/2021   Hot flashes 08/23/2021   Nausea without vomiting 08/23/2021   Screening mammogram for breast cancer 08/23/2021   S/P hysterectomy 08/23/2021   S/P PTCA (percutaneous transluminal coronary angioplasty)    NSTEMI (non-ST elevated myocardial infarction) (HCC) 09/21/2020   Chest pain 09/20/2020   Obesity (BMI 30.0-34.9) 09/20/2020    Essential hypertension 09/20/2020   Tobacco abuse 09/20/2020   Elevated troponin 09/20/2020   Pain in both upper extremities 06/26/2019   Weakness 06/26/2019    Past Medical History:  Diagnosis Date   Anxiety    Arthritis    Back pain    CAD (coronary artery disease)    Angioplasty of D1 January 2022   Essential hypertension    Fibromyalgia    per patient   NSTEMI (non-ST elevated myocardial infarction) [facility])    January 2022    Family History  Problem Relation Age of Onset   Diabetes Mother    Hypertension Mother    Dementia Mother    Thyroid disease Mother    Alzheimer's disease Father    Thyroid disease Brother    Anxiety disorder Daughter    Depression Daughter    Depression Daughter    Anxiety disorder Daughter    Depression Son    Anxiety disorder Son        social anxiety   ODD Son    ADD / ADHD Son    Past Surgical History:  Procedure Laterality Date   ABDOMINAL HYSTERECTOMY     APPENDECTOMY     BIOPSY  10/25/2021   Procedure: BIOPSY;  Surgeon: Dolores Frame, MD;  [facility];  Service: Gastroenterology;;   BIOPSY  05/18/2022   Procedure: BIOPSY;  Surgeon: Dolores Frame, MD;  [facility];  Service: Gastroenterology;;   BREAST SURGERY     CARDIAC CATHETERIZATION  09/21/2020   CHOLECYSTECTOMY     COLONOSCOPY WITH PROPOFOL N/A 10/25/2021   Procedure: COLONOSCOPY WITH PROPOFOL;  Surgeon: Dolores Frame, MD;  [facility]: [facility];  Service: Gastroenterology;  Laterality: N/A;  105   CORONARY BALLOON ANGIOPLASTY  09/21/2020   CORONARY/GRAFT ACUTE MI REVASCULARIZATION N/A 09/21/2020   Procedure: Coronary/Graft Acute MI Revascularization;  Surgeon: Corky Crafts, MD;  Location: [facility] INVASIVE CV LAB;  Service: Cardiovascular;  Laterality: N/A;   ESOPHAGOGASTRODUODENOSCOPY (EGD) WITH PROPOFOL N/A 05/18/2022   Procedure: ESOPHAGOGASTRODUODENOSCOPY (EGD) WITH PROPOFOL;  Surgeon: Dolores Frame,  MD;  [facility]: [facility];  Service: Gastroenterology;  Laterality: N/A;  930 ASA 3   LEFT HEART CATH AND CORONARY ANGIOGRAPHY N/A 09/21/2020   Procedure: LEFT HEART CATH AND CORONARY ANGIOGRAPHY;  Surgeon: Corky Crafts, MD;  Location: [facility] INVASIVE CV LAB;  Service: Cardiovascular;  Laterality: N/A;   TUBAL LIGATION     Social History   Social History Narrative   Lives at home with daughter, Doreene Adas and fiance.   10-12 cups caffeine per day.   Right-handed.   Immunization History  Administered Date(s) Administered   Influenza-Unspecified 06/16/2019, 04/21/2020, 09/22/2021   Tdap 06/14/2022     Objective: Vital Signs: BP (!) 132/92 (BP Location: Right Arm, Patient Position: Sitting, Cuff Size: Normal)   Pulse 97   Resp 17   Ht 5\' 5"  (1.651 m)   Wt 177 lb 6.4 oz (80.5 kg)   BMI 29.52 kg/m    Physical Exam Vitals and nursing note reviewed.  Constitutional:      Appearance: She is well-developed.  HENT:     Head:  Normocephalic and atraumatic.  Eyes:     Conjunctiva/sclera: Conjunctivae normal.  Cardiovascular:     Rate and Rhythm: Normal rate and regular rhythm.     Heart sounds: Normal heart sounds.  Pulmonary:     Effort: Pulmonary effort is normal.     Breath sounds: Normal breath sounds.  Abdominal:     General: Bowel sounds are normal.     Palpations: Abdomen is soft.  Musculoskeletal:     Cervical back: Normal range of motion.  Lymphadenopathy:     Cervical: No cervical adenopathy.  Skin:    General: Skin is warm and dry.     Capillary Refill: Capillary refill takes less than 2 seconds.     Comments: No sclerodactyly, Telangiectasia or nailbed capillary changes were noted.  Neurological:     Mental Status: She is alert and oriented to person, place, and time.  Psychiatric:        Behavior: Behavior normal.      Musculoskeletal Exam: She had good range of motion of the cervical spine with no discomfort.  She had limited painful range of motion of  her lumbar spine.  Shoulder joints, elbow joints, wrist joints, MCPs PIPs and DIPs were in good range of motion with no synovitis.  Hip joints and knee joints were in good range of motion without any warmth swelling or effusion.  There was no tenderness over ankles, MTPs PIPs.  There was no plantar fasciitis or Achilles tendinitis.  CDAI Exam: CDAI Score: -- Patient Global: --; Provider Global: -- Swollen: --; Tender: -- Joint Exam 02/08/2023   No joint exam has been documented for this visit   There is currently no information documented on the homunculus. Go to the Rheumatology activity and complete the homunculus joint exam.  Investigation: No additional findings.  Imaging: SLEEP STUDY DOCUMENTS  Result Date: 02/06/2023 Ordered by an unspecified provider.  Split night study  Result Date: 02/05/2023 Oretha Milch, MD     02/07/2023 11:36 AM Patient Name: Neoma Laming Study Date: 02/05/2023 Gender: Female D.O.B: 1971/08/01 Age (years): 51 Referring Provider: Cyril Mourning MD, ABSM Height (inches): 66 Interpreting Physician: Cyril Mourning MD, ABSM Weight (lbs): 184 RPSGT: Alfonso Ellis BMI: 30 MRN: [id number] Neck Size: 16.50 <br> <br> CLINICAL INFORMATION Sleep Study Type: NPSG Indication for sleep study: excessive daytime somnolence, narrow pharyngeal exam & loud snoring Epworth Sleepiness Score: 10 SLEEP STUDY TECHNIQUE As per the AASM Manual for the Scoring of Sleep and Associated Events v2.3 (April 2016) with a hypopnea requiring 4% desaturations. The channels recorded and monitored were frontal, central and occipital EEG, electrooculogram (EOG), submentalis EMG (chin), nasal and oral airflow, thoracic and abdominal wall motion, anterior tibialis EMG, snore microphone, electrocardiogram, and pulse oximetry. MEDICATIONS Medications self-administered by patient taken the night of the study : ACETAMINOPHEN W/OXYCODONE, CLONAZEPAM, CLONIDINE, GABAPENTIN, Rosuvastatin, SOY, TIZANIDINE SLEEP  ARCHITECTURE The study was initiated at 10:11:08 PM and ended at 5:27:10 AM. Sleep onset time was 5.3 minutes and the sleep efficiency was 92.5%. The total sleep time was 403.3 minutes. Stage REM latency was 237.5 minutes. The patient spent 3.35% of the night in stage N1 sleep, 63.17% in stage N2 sleep, 16.61% in stage N3 and 16.9% in REM. Alpha intrusion was absent. Supine sleep was 27.96%. RESPIRATORY PARAMETERS The overall apnea/hypopnea index (AHI) was 1.2 per hour. There were 0 total apneas, including 0 obstructive, 0 central and 0 mixed apneas. There were 8 hypopneas and 0 RERAs. The AHI during Stage REM sleep was  5.3 per hour. AHI while supine was 4.3 per hour. The mean oxygen saturation was 89.62%. The minimum SpO2 during sleep was 85.00%. moderate snoring was noted during this study. CARDIAC DATA The 2 lead EKG demonstrated sinus rhythm. The mean heart rate was 51.76 beats per minute. Other EKG findings include: None. LEG MOVEMENT DATA The total PLMS were 0 with a resulting PLMS index of 0.00. Associated arousal with leg movement index was 0.0 . IMPRESSIONS - No significant obstructive sleep apnea occurred during this study (AHI = 1.2/h). - Mild oxygen desaturation was noted during this study (Min O2 = 85.00%). - The patient snored with moderate snoring volume. - No cardiac abnormalities were noted during this study. - Clinically significant periodic limb movements did not occur during sleep. No significant associated arousals. DIAGNOSIS - Nocturnal Hypoxemia (G47.36) RECOMMENDATIONS - Consider nocturnal oxygen - Avoid alcohol, sedatives and other CNS depressants that may worsen sleep apnea and disrupt normal sleep architecture. - Sleep hygiene should be reviewed to assess factors that may improve sleep quality. - Weight management and regular exercise should be initiated or continued if appropriate. [Electronically signed] 02/07/2023 11:35 AM Cyril Mourning MD, ABSM Diplomate, American Board of Sleep Medicine  NPI: [id number]   Recent Labs: Lab Results  Component Value Date   WBC 6.8 05/25/2022   HGB 15.4 (H) 05/25/2022   PLT 293 05/25/2022   NA 144 09/08/2022   K 3.2 (L) 09/08/2022   CL 104 09/08/2022   CO2 24 09/08/2022   GLUCOSE 113 (H) 09/08/2022   BUN 5 (L) 09/08/2022   CREATININE 0.85 09/08/2022   BILITOT 0.5 09/08/2022   ALKPHOS 67 09/08/2022   AST 15 09/08/2022   ALT 15 09/08/2022   PROT 6.7 09/08/2022   ALBUMIN 4.2 09/08/2022   CALCIUM 9.2 09/08/2022   GFRAA >60 02/26/2020    Speciality Comments: No specialty comments available.  Procedures:  No procedures performed Allergies: Cortisone and Doxycycline   Assessment / Plan:     Visit Diagnoses: Positive ANA (antinuclear antibody) - 04/18/22: ANA 1:320 NN, 1:40NS, Actin ab-, mitochrondrial ab 51.0 -patient was found to have positive ANA during her GI workup.  She denies any history of oral ulcers, nasal ulcers, malar rash, photosensitivity, Raynaud's or lymphadenopathy.  She gives history of sicca symptoms which may be related to her medication use.  No sclerodactyly, nailbed capillary changes were noted.  She had good capillary refill.  I will obtain additional labs today.  Plan: Protein / creatinine ratio, urine, ANA, Anti-scleroderma antibody, RNP Antibody, Anti-Smith antibody, Sjogrens syndrome-B extractable nuclear antibody, Sjogrens syndrome-A extractable nuclear antibody, Anti-DNA antibody, double-stranded, C3 and C4  DDD (degenerative disc disease), cervical-she complains of discomfort in the cervical spine.  CT scan of the cervical spine from May 2020 was reviewed which showed mild to moderate facet joint degeneration without any spinal stenosis.  Mild disc bulge were also noted.  DDD (degenerative disc disease), lumbar-patient has been going to pain management and is taking narcotics due to severe pain and discomfort in the lower back.  She had a lot of difficulty with mobility due to lower back pain.  She had limited  painful range of motion of her lumbar spine.  I reviewed MRI of her lumbar spine from March 17, 2019 which showed mild broad-based disc bulge and bilateral facet joint arthropathy at L3-4 and L4-5.  No spinal stenosis was noted.  She has had several injections over the years and also had nerve ablation 1 year ago.  Chronic  prescription opiate use-she is on Percocet by pain management.  Elevated LFTs -her liver functions were elevated.  She had MRI of her liver which showed benign segment V hepatic hemangioma.  She also had a core biopsy of her liver which was unremarkable.  Her liver functions normalized.  Gastroparesis-most likely due to opiate use.  She has been followed by Dr. Levon Hedger.  Irritable bowel syndrome with constipation-followed by Dr. Levon Hedger.  Essential hypertension-blood pressure was 132/92.  Repeat blood pressure was 124/88.  She was advised to monitor blood pressure closely and follow-up with her PCP.  NSTEMI (non-ST elevated myocardial infarction) (HCC)  S/P PTCA (percutaneous transluminal coronary angioplasty)  Anxiety and depression  Tobacco abuse - 1PPDx20 years  S/P hysterectomy  Orders: Orders Placed This Encounter  Procedures   Protein / creatinine ratio, urine   ANA   Anti-scleroderma antibody   RNP Antibody   Anti-Smith antibody   Sjogrens syndrome-B extractable nuclear antibody   Sjogrens syndrome-A extractable nuclear antibody   Anti-DNA antibody, double-stranded   C3 and C4   No orders of the defined types were placed in this encounter.    Follow-Up Instructions: Return for Positive ANA.   Pollyann Savoy, MD  Note - This record has been created using Animal nutritionist.  Chart creation errors have been sought, but may not always  have been located. Such creation errors do not reflect on  the standard of medical care.

## 2023-02-05 ENCOUNTER — Ambulatory Visit: Payer: Medicaid Other | Attending: Pulmonary Disease | Admitting: Pulmonary Disease

## 2023-02-05 DIAGNOSIS — R0683 Snoring: Secondary | ICD-10-CM | POA: Diagnosis present

## 2023-02-05 DIAGNOSIS — G4736 Sleep related hypoventilation in conditions classified elsewhere: Secondary | ICD-10-CM | POA: Diagnosis not present

## 2023-02-05 DIAGNOSIS — G4734 Idiopathic sleep related nonobstructive alveolar hypoventilation: Secondary | ICD-10-CM

## 2023-02-07 ENCOUNTER — Telehealth: Payer: Self-pay | Admitting: Pulmonary Disease

## 2023-02-07 DIAGNOSIS — R0683 Snoring: Secondary | ICD-10-CM

## 2023-02-07 DIAGNOSIS — G4734 Idiopathic sleep related nonobstructive alveolar hypoventilation: Secondary | ICD-10-CM

## 2023-02-07 DIAGNOSIS — Z720 Tobacco use: Secondary | ICD-10-CM

## 2023-02-07 NOTE — Procedures (Signed)
Patient Name: Dominique Hinton, [place] Date: 02/05/2023 Gender: Female D.O.B: 01/20/1971 Age (years): 58 Referring Provider: Cyril Mourning MD, ABSM Height (inches): 66 Interpreting Physician: Cyril Mourning MD, ABSM Weight (lbs): 184 RPSGT: Alfonso Ellis BMI: 30 MRN: [id number] Neck Size: 16.50 <br> <br> CLINICAL INFORMATION Sleep Study Type: NPSG    Indication for sleep study: excessive daytime somnolence, narrow pharyngeal exam & loud snoring    Epworth Sleepiness Score: 10    SLEEP STUDY TECHNIQUE As per the AASM Manual for the Scoring of Sleep and Associated Events v2.3 (April 2016) with a hypopnea requiring 4% desaturations.  The channels recorded and monitored were frontal, central and occipital EEG, electrooculogram (EOG), submentalis EMG (chin), nasal and oral airflow, thoracic and abdominal wall motion, anterior tibialis EMG, snore microphone, electrocardiogram, and pulse oximetry.  MEDICATIONS Medications self-administered by patient taken the night of the study : ACETAMINOPHEN W/OXYCODONE, CLONAZEPAM, CLONIDINE, GABAPENTIN, Rosuvastatin, SOY, TIZANIDINE  SLEEP ARCHITECTURE The study was initiated at 10:11:08 PM and ended at 5:27:10 AM.  Sleep onset time was 5.3 minutes and the sleep efficiency was 92.5%. The total sleep time was 403.3 minutes.  Stage REM latency was 237.5 minutes.  The patient spent 3.35% of the night in stage N1 sleep, 63.17% in stage N2 sleep, 16.61% in stage N3 and 16.9% in REM.  Alpha intrusion was absent.  Supine sleep was 27.96%.  RESPIRATORY PARAMETERS The overall apnea/hypopnea index (AHI) was 1.2 per hour. There were 0 total apneas, including 0 obstructive, 0 central and 0 mixed apneas. There were 8 hypopneas and 0 RERAs.  The AHI during Stage REM sleep was 5.3 per hour.  AHI while supine was 4.3 per hour.  The mean oxygen saturation was 89.62%. The minimum SpO2 during sleep was 85.00%.  moderate snoring was noted during this  study.  CARDIAC DATA The 2 lead EKG demonstrated sinus rhythm. The mean heart rate was 51.76 beats per minute. Other EKG findings include: None.   LEG MOVEMENT DATA The total PLMS were 0 with a resulting PLMS index of 0.00. Associated arousal with leg movement index was 0.0 .  IMPRESSIONS - No significant obstructive sleep apnea occurred during this study (AHI = 1.2/h). - Mild oxygen desaturation was noted during this study (Min O2 = 85.00%). - The patient snored with moderate snoring volume. - No cardiac abnormalities were noted during this study. - Clinically significant periodic limb movements did not occur during sleep.  No significant associated arousals.   DIAGNOSIS - Nocturnal Hypoxemia (G47.36)   RECOMMENDATIONS - Consider nocturnal oxygen - Avoid alcohol, sedatives and other CNS depressants that may worsen sleep apnea and disrupt normal sleep architecture. - Sleep hygiene should be reviewed to assess factors that may improve sleep quality. - Weight management and regular exercise should be initiated or continued if appropriate.  [Electronically signed] 02/07/2023 11:35 AM  Cyril Mourning MD, ABSM Diplomate, American Board of Sleep Medicine NPI: [id number]

## 2023-02-07 NOTE — Telephone Encounter (Signed)
Sleep study did not show significant OSA. However it did show drop in oxygen levels about 2.5 hours. We must investigate for underlying lung disease that is causing this. suggest that you obtain PFTs We can consider starting oxygen during sleep if she is willing or she can focus on quitting smoking Follow-up office visit in 3 months with APP/me at [facility]

## 2023-02-08 ENCOUNTER — Encounter: Payer: Self-pay | Admitting: Rheumatology

## 2023-02-08 ENCOUNTER — Ambulatory Visit: Payer: Medicaid Other | Attending: Rheumatology | Admitting: Rheumatology

## 2023-02-08 VITALS — BP 132/92 | HR 97 | Resp 17 | Ht 65.0 in | Wt 177.4 lb

## 2023-02-08 DIAGNOSIS — M5136 Other intervertebral disc degeneration, lumbar region: Secondary | ICD-10-CM | POA: Diagnosis not present

## 2023-02-08 DIAGNOSIS — R7989 Other specified abnormal findings of blood chemistry: Secondary | ICD-10-CM

## 2023-02-08 DIAGNOSIS — R768 Other specified abnormal immunological findings in serum: Secondary | ICD-10-CM

## 2023-02-08 DIAGNOSIS — I10 Essential (primary) hypertension: Secondary | ICD-10-CM

## 2023-02-08 DIAGNOSIS — Z79891 Long term (current) use of opiate analgesic: Secondary | ICD-10-CM

## 2023-02-08 DIAGNOSIS — M5030 Other cervical disc degeneration, unspecified cervical region: Secondary | ICD-10-CM

## 2023-02-08 DIAGNOSIS — F419 Anxiety disorder, unspecified: Secondary | ICD-10-CM

## 2023-02-08 DIAGNOSIS — Z9861 Coronary angioplasty status: Secondary | ICD-10-CM

## 2023-02-08 DIAGNOSIS — K3184 Gastroparesis: Secondary | ICD-10-CM

## 2023-02-08 DIAGNOSIS — F32A Depression, unspecified: Secondary | ICD-10-CM

## 2023-02-08 DIAGNOSIS — Z720 Tobacco use: Secondary | ICD-10-CM

## 2023-02-08 DIAGNOSIS — I214 Non-ST elevation (NSTEMI) myocardial infarction: Secondary | ICD-10-CM

## 2023-02-08 DIAGNOSIS — K581 Irritable bowel syndrome with constipation: Secondary | ICD-10-CM

## 2023-02-08 DIAGNOSIS — Z90710 Acquired absence of both cervix and uterus: Secondary | ICD-10-CM

## 2023-02-09 LAB — ANTI-DNA ANTIBODY, DOUBLE-STRANDED: ds DNA Ab: 1 IU/mL

## 2023-02-09 LAB — C3 AND C4: C4 Complement: 23 mg/dL (ref 15–57)

## 2023-02-09 LAB — PROTEIN / CREATININE RATIO, URINE: Creatinine, Urine: 23 mg/dL (ref 20–275)

## 2023-02-10 LAB — ANA: Anti Nuclear Antibody (ANA): NEGATIVE

## 2023-02-10 LAB — RNP ANTIBODY: Ribonucleic Protein(ENA) Antibody, IgG: <1 AI

## 2023-02-10 LAB — ANTI-SMITH ANTIBODY: ENA SM Ab Ser-aCnc: <1 AI

## 2023-02-10 LAB — SJOGRENS SYNDROME-B EXTRACTABLE NUCLEAR ANTIBODY: SSB (La) (ENA) Antibody, IgG: <1 AI

## 2023-02-10 LAB — PROTEIN / CREATININE RATIO, URINE: Total Protein, Urine: <4 mg/dL — ABNORMAL LOW (ref 5–24)

## 2023-02-10 LAB — ANTI-SCLERODERMA ANTIBODY: Scleroderma (Scl-70) (ENA) Antibody, IgG: <1 AI

## 2023-02-10 LAB — SJOGRENS SYNDROME-A EXTRACTABLE NUCLEAR ANTIBODY: SSA (Ro) (ENA) Antibody, IgG: <1 AI

## 2023-02-10 LAB — C3 AND C4: C3 Complement: 152 mg/dL (ref 83–193)

## 2023-02-10 NOTE — Progress Notes (Signed)
ANA negative, ENA panel negative, complements normal, urine protein creatinine ratio normal.  All autoimmune workup is negative.  Patient may cancel new patient follow-up appointment.  Please forward her labs to her PCP and her gastroenterologist.

## 2023-02-14 NOTE — Telephone Encounter (Signed)
ATC patient. LVMTCB. 

## 2023-02-16 NOTE — Telephone Encounter (Signed)
Made appt for PT @ [facility] based on preference. Also set PFT. Please put in order for PFT so I can link it to that appt. NFN

## 2023-02-19 ENCOUNTER — Other Ambulatory Visit: Payer: Self-pay | Admitting: *Deleted

## 2023-02-19 DIAGNOSIS — Z87891 Personal history of nicotine dependence: Secondary | ICD-10-CM

## 2023-02-19 DIAGNOSIS — Z122 Encounter for screening for malignant neoplasm of respiratory organs: Secondary | ICD-10-CM

## 2023-02-19 DIAGNOSIS — F17210 Nicotine dependence, cigarettes, uncomplicated: Secondary | ICD-10-CM

## 2023-02-19 NOTE — Telephone Encounter (Signed)
Order for PFT has been placed. NFN att.

## 2023-02-27 ENCOUNTER — Other Ambulatory Visit: Payer: Self-pay | Admitting: Cardiology

## 2023-03-02 ENCOUNTER — Ambulatory Visit: Payer: Medicaid Other | Admitting: Rheumatology

## 2023-03-23 ENCOUNTER — Ambulatory Visit (HOSPITAL_BASED_OUTPATIENT_CLINIC_OR_DEPARTMENT_OTHER): Payer: Medicaid Other | Admitting: Pulmonary Disease

## 2023-03-27 ENCOUNTER — Encounter: Payer: Self-pay | Admitting: Physician Assistant

## 2023-03-27 ENCOUNTER — Ambulatory Visit: Payer: Medicaid Other | Admitting: Physician Assistant

## 2023-03-27 DIAGNOSIS — F17210 Nicotine dependence, cigarettes, uncomplicated: Secondary | ICD-10-CM

## 2023-03-27 NOTE — Patient Instructions (Signed)

## 2023-03-27 NOTE — Progress Notes (Signed)
Virtual Visit via Telephone Note  I connected with Dominique Hinton on 03/27/23  at  1030by telephone and verified that I am speaking with the correct person using two identifiers.  Location: Patient: home Provider: working virtually from home   I discussed the limitations, risks, security and privacy concerns of performing an evaluation and management service by telephone and the availability of in person appointments. I also discussed with the patient that there may be a patient responsible charge related to this service. The patient expressed understanding and agreed to proceed.     Shared Decision Making Visit Lung Cancer Screening Program [phone])   Eligibility: Age 52 Pack Years Smoking History Calculation 32 (# packs/per year x # years smoked) Recent History of coughing up blood  No Unexplained weight loss? No ( >Than 15 pounds within the last 6 months ) Prior History Lung / other cancer No (Diagnosis within the last 5 years already requiring surveillance chest CT Scans). Smoking Status Current Smoker  Visit Components: Discussion included one or more decision making aids. Yes Discussion included risk/benefits of screening. Yes Discussion included potential follow up diagnostic testing for abnormal scans. Yes Discussion included meaning and risk of over diagnosis. Yes Discussion included meaning and risk of False Positives. Yes Discussion included meaning of total radiation exposure. Yes  Counseling Included: Importance of adherence to annual lung cancer LDCT screening. Yes Impact of comorbidities on ability to participate in the program. Yes Ability and willingness to under diagnostic treatment: Yes  Smoking Cessation Counseling: Current Smokers:  Discussed importance of smoking cessation. Yes Information about tobacco cessation classes and interventions provided to patient. Yes Symptomatic Patient. No Diagnosis Code: Tobacco Use Z72.0 Asymptomatic Patient  Yes  Counseling (Intermediate counseling: > three minutes counseling) X3235 Information about tobacco cessation classes and interventions provided to patient. Yes Written Order for Lung Cancer Screening with LDCT placed in Epic. Yes (CT Chest Lung Cancer Screening Low Dose W/O CM) [id number] Z12.2-Screening of respiratory organs Z87.891-Personal history of nicotine dependence   I have spent 25 minutes of face to face/ virtual visit  time with the patient discussing the risks and benefits of lung cancer screening. We discussed the above noted topics. We paused at intervals to allow for questions to be asked and answered to ensure understanding.We discussed that the single most powerful action that anyone can take to decrease their risk of developing lung cancer is to quit smoking.  We discussed options for tools to aid in quitting smoking including nicotine replacement therapy, non-nicotine medications, support groups, Quit Smart classes, and behavior modification. We discussed that often times setting smaller, more achievable goals, such as eliminating 1 cigarette a day for a week and then 2 cigarettes a day for a week can be helpful in slowly decreasing the number of cigarettes smoked. I provided  them  with smoking cessation  information  with contact information for community resources, classes, free nicotine replacement therapy, and access to mobile apps, text messaging, and on-line smoking cessation help. I have also provided  them  the office contact information in the event they have any questions. We discussed the time and location of the scan, and that either Dominique Miyamoto RN, Dominique Lemon, RN  or I will call / send a letter with the results within 24-72 hours of receiving them. The patient verbalized understanding of all of  the above and had no further questions upon leaving the office. They have my contact information in the event they have any further questions.  I spent 2 minutes counseling  on smoking cessation and the health risks of continued tobacco abuse.  I explained to the patient that there has been a high incidence of coronary artery disease noted on these exams. I explained that this is a non-gated exam therefore degree or severity cannot be determined. This patient is on statin therapy. I have asked the patient to follow-up with their PCP regarding any incidental finding of coronary artery disease and management with diet or medication as their PCP  feels is clinically indicated. The patient verbalized understanding of the above and had no further questions upon completion of the visit.    Dominique Gasman Hannahgrace Lalli, PA-C

## 2023-03-28 ENCOUNTER — Ambulatory Visit (HOSPITAL_COMMUNITY)
Admission: RE | Admit: 2023-03-28 | Discharge: 2023-03-28 | Disposition: A | Discharge status: Home / Self Care | Payer: Medicaid Other | Source: Ambulatory Visit | Attending: Acute Care | Admitting: Acute Care

## 2023-03-28 DIAGNOSIS — F17210 Nicotine dependence, cigarettes, uncomplicated: Secondary | ICD-10-CM | POA: Diagnosis present

## 2023-03-28 DIAGNOSIS — Z87891 Personal history of nicotine dependence: Secondary | ICD-10-CM | POA: Insufficient documentation

## 2023-03-28 DIAGNOSIS — Z122 Encounter for screening for malignant neoplasm of respiratory organs: Secondary | ICD-10-CM | POA: Insufficient documentation

## 2023-03-30 ENCOUNTER — Telehealth: Payer: Self-pay | Admitting: Pulmonary Disease

## 2023-03-30 DIAGNOSIS — J9610 Chronic respiratory failure, unspecified whether with hypoxia or hypercapnia: Secondary | ICD-10-CM

## 2023-03-30 NOTE — Telephone Encounter (Signed)
Received fax request for o2 testing in the past 30 days from [place] like PCP- Dr. Ouida Sills ordered noct o2 2lpm   They are in need of qualifying noct sats and due to medicare guidelines, they can not accept sleep study report  Dr Vassie Loll- Dr [facility] Smoker office did not have qualifying sats. Are you okay to order ONO on RA?

## 2023-04-03 NOTE — Telephone Encounter (Signed)
Oretha Milch, MD  to Me     03/30/23  5:07 PM OK to order ONO on RA  Order placed  Nothing further needed

## 2023-04-05 ENCOUNTER — Other Ambulatory Visit: Payer: Self-pay

## 2023-04-05 DIAGNOSIS — Z87891 Personal history of nicotine dependence: Secondary | ICD-10-CM

## 2023-04-05 DIAGNOSIS — F17210 Nicotine dependence, cigarettes, uncomplicated: Secondary | ICD-10-CM

## 2023-04-05 DIAGNOSIS — Z122 Encounter for screening for malignant neoplasm of respiratory organs: Secondary | ICD-10-CM

## 2023-04-23 ENCOUNTER — Ambulatory Visit (INDEPENDENT_AMBULATORY_CARE_PROVIDER_SITE_OTHER): Payer: Medicaid Other | Admitting: Pulmonary Disease

## 2023-04-23 DIAGNOSIS — Z720 Tobacco use: Secondary | ICD-10-CM | POA: Diagnosis not present

## 2023-04-23 LAB — PULMONARY FUNCTION TEST
DL/VA % pred: 93 %
DL/VA: 4 ml/min/mmHg/L
DLCO cor % pred: 110 %
DLCO cor: 23.88 ml/min/mmHg
DLCO unc % pred: 110 %
DLCO unc: 23.88 ml/min/mmHg
FEF 25-75 Post: 3.27 L/s
FEF 25-75 Pre: 2.4 L/s
FEF2575-%Change-Post: 36 %
FEF2575-%Pred-Post: 117 %
FEF2575-%Pred-Pre: 86 %
FEV1-%Change-Post: 11 %
FEV1-%Pred-Post: 107 %
FEV1-%Pred-Pre: 96 %
FEV1-Post: 3.1 L
FEV1-Pre: 2.77 L
FEV1FVC-%Change-Post: 4 %
FEV1FVC-%Pred-Pre: 93 %
FEV6-%Change-Post: 6 %
FEV6-%Pred-Post: 111 %
FEV6-%Pred-Pre: 104 %
FEV6-Post: 3.95 L
FEV6-Pre: 3.7 L
FEV6FVC-%Pred-Post: 102 %
FEV6FVC-%Pred-Pre: 102 %
FVC-%Change-Post: 6 %
FVC-%Pred-Post: 108 %
FVC-%Pred-Pre: 101 %
FVC-Post: 3.95 L
FVC-Pre: 3.7 L
Post FEV1/FVC ratio: 78 %
Post FEV6/FVC ratio: 100 %
Pre FEV1/FVC ratio: 75 %
Pre FEV6/FVC Ratio: 100 %
RV % pred: 159 %
RV: 2.96 L
TLC % pred: 129 %
TLC: 6.75 L

## 2023-04-23 NOTE — Patient Instructions (Signed)
Full PFT performed today. °

## 2023-04-23 NOTE — Progress Notes (Signed)
Full PFT performed today. °

## 2023-04-24 ENCOUNTER — Encounter (HOSPITAL_BASED_OUTPATIENT_CLINIC_OR_DEPARTMENT_OTHER): Payer: Medicaid Other

## 2023-04-24 ENCOUNTER — Encounter (HOSPITAL_BASED_OUTPATIENT_CLINIC_OR_DEPARTMENT_OTHER): Payer: Self-pay | Admitting: Pulmonary Disease

## 2023-04-24 ENCOUNTER — Ambulatory Visit (HOSPITAL_BASED_OUTPATIENT_CLINIC_OR_DEPARTMENT_OTHER): Payer: Medicaid Other | Admitting: Pulmonary Disease

## 2023-04-24 VITALS — BP 114/74 | HR 68 | Resp 17 | Ht 65.0 in | Wt 178.0 lb

## 2023-04-24 DIAGNOSIS — G4734 Idiopathic sleep related nonobstructive alveolar hypoventilation: Secondary | ICD-10-CM | POA: Diagnosis not present

## 2023-04-24 DIAGNOSIS — Z720 Tobacco use: Secondary | ICD-10-CM

## 2023-04-24 NOTE — Assessment & Plan Note (Signed)
Appears to be minimal on nocturnal oximetry, was more impressive during NPSG for about 2 hours.  This may be related to hypoventilation induced by sedating medications such as gabapentin, oxycodone, tizanidine.  I have asked her to decrease these medications. Hold off on nocturnal oxygen unless her breathing gets worse in which case we can repeat nocturnal oximetry

## 2023-04-24 NOTE — Assessment & Plan Note (Signed)
Mild emphysema noted on the CT scan also respiratory bronchiolitis.  I use results of PFTs to motivate her to quit.  She is ready to start nicotine patches

## 2023-04-24 NOTE — Patient Instructions (Addendum)
X QUIT smoking  OK to use nicotine patches 14 mg/day  If breathing is worse, let us know & we can repeat oxygen test

## 2023-04-24 NOTE — Progress Notes (Signed)
   Subjective:    Patient ID: Dominique Hinton, female    DOB: 1970/12/30, 52 y.o.   MRN: [id number]  HPI  52 yo smoker for FU of OSA and restless leg syndrome. She smokes about a pack per day, more than 30 pack years She admits to smoking weed  PMH - gastroparesis, anxiety, back pain,  -CAD, HTN, NSTEMI (2022),  -elevated LFTs possibly related to drug-induced liver injury, on liver biopsy-workup showed normal iron stores, normal alpha-1 antitrypsin, positive ANA with a titer of 1:320, negative anti-smooth muscle antibodies, with normal ceruloplasmin of 29, negative acute hepatitis panel.  AMA was 51.   65-month follow-up visit. She continues to smoke about a pack per day. We reviewed results of her tests including PSG, PFTs, low-dose CT and nocturnal oximetry Her PCP ordered nocturnal oxygen but this was not approved She is planning to get married in 3 months  Significant tests/ events reviewed  02/2023 NPSG no significant OSA, low saturation 85%, 151 minutes with saturation less than 88%.   ONO/ RA 04/2023 SpO2 less than 88% for 2 minutes PFTs 04/2023 normal, 11% bronchodilator response, hyperinflation, increased TLC.  03/2023 LDCT showed emphysema   04/2022 iron/TIBC 96/303= 32%   Review of Systems neg for any significant sore throat, dysphagia, itching, sneezing, nasal congestion or excess/ purulent secretions, fever, chills, sweats, unintended wt loss, pleuritic or exertional cp, hempoptysis, orthopnea pnd or change in chronic leg swelling. Also denies presyncope, palpitations, heartburn, abdominal pain, nausea, vomiting, diarrhea or change in bowel or urinary habits, dysuria,hematuria, rash, arthralgias, visual complaints, headache, numbness weakness or ataxia.     Objective:   Physical Exam  Gen. Pleasant, well-nourished, in no distress ENT - no thrush, no pallor/icterus,no post nasal drip Neck: No JVD, no thyromegaly, no carotid bruits Lungs: no use of accessory muscles,  no dullness to percussion, clear without rales or rhonchi  Cardiovascular: Rhythm regular, heart sounds  normal, no murmurs or gallops, no peripheral edema Musculoskeletal: No deformities, no cyanosis or clubbing        Assessment & Plan:

## 2023-05-09 ENCOUNTER — Telehealth (HOSPITAL_BASED_OUTPATIENT_CLINIC_OR_DEPARTMENT_OTHER): Payer: Self-pay | Admitting: Pulmonary Disease

## 2023-05-09 DIAGNOSIS — J9610 Chronic respiratory failure, unspecified whether with hypoxia or hypercapnia: Secondary | ICD-10-CM

## 2023-05-09 NOTE — Telephone Encounter (Signed)
Spoke with patient. She states that she has not been rinsing her mouth after use. She states that she has a white residue on her tongue and it burns as well as the roof of her mouth. Please advise.

## 2023-05-09 NOTE — Telephone Encounter (Signed)
Patient called and states her inhaler ventolin prescribed by PCP is tearing her mouth up, and her tongue as well as the roof of her mouth is on fire. Patient was told by her PCP to call us.   Please advise what patient should do and call back.

## 2023-05-11 ENCOUNTER — Telehealth: Payer: Self-pay | Admitting: Acute Care

## 2023-05-11 NOTE — Telephone Encounter (Signed)
I have called the patient back. She has mouth irritation with her Ventolin inhaler. She called the office ( [facility])  on Tuesday, but has not heard back She denies any white coating to her tongue. I have asked her not to use the inhaler unless she really needs it for afew days.  If she needs to use it I have asked her to rinse her mouth out right after. I have messaged Triage to get her a spacer to see if this helps. I have told her to seek emergency care over the weekend if it gets worse . I have told her to avoid alcohol and acidic foots like tomato based foods, and Citrus . She verbalized understanding.

## 2023-05-11 NOTE — Telephone Encounter (Signed)
Pt calling in bc her mouth is still hurting her and she hasn't rcvd no call back and wants to know if something will be sent in before the weekend

## 2023-05-11 NOTE — Telephone Encounter (Signed)
Called pt left detailed vm  regarding her results and also sent her my chart message

## 2023-05-11 NOTE — Telephone Encounter (Signed)
Patient is still having mouth burning due to her inhaler. She wants to know if a new one will be called in. Call back number [phone]  Pharmacy [facility] on [address]

## 2023-05-14 NOTE — Telephone Encounter (Signed)
No spacer available.  Order placed.

## 2023-05-15 ENCOUNTER — Other Ambulatory Visit: Payer: Self-pay | Admitting: Cardiology

## 2023-05-23 ENCOUNTER — Ambulatory Visit: Payer: Medicaid Other | Attending: Cardiology | Admitting: Cardiology

## 2023-05-23 ENCOUNTER — Encounter: Payer: Self-pay | Admitting: Cardiology

## 2023-05-23 VITALS — BP 110/76 | HR 76 | Ht 66.0 in | Wt 181.0 lb

## 2023-05-23 DIAGNOSIS — I10 Essential (primary) hypertension: Secondary | ICD-10-CM | POA: Diagnosis not present

## 2023-05-23 DIAGNOSIS — E782 Mixed hyperlipidemia: Secondary | ICD-10-CM

## 2023-05-23 DIAGNOSIS — I25119 Atherosclerotic heart disease of native coronary artery with unspecified angina pectoris: Secondary | ICD-10-CM | POA: Diagnosis not present

## 2023-05-23 MED ORDER — POTASSIUM CHLORIDE ER 10 MEQ PO TBCR: 10.0000 meq | EXTENDED_RELEASE_TABLET | Freq: Every day | ORAL | 3 refills | Status: AC

## 2023-05-23 NOTE — Patient Instructions (Addendum)
Medication Instructions:  START Potassium 10 meq daily  Labwork: None today  Testing/Procedures: None today  Follow-Up: 6 months  Any Other Special Instructions Will Be Listed Below (If Applicable).  If you need a refill on your cardiac medications before your next appointment, please call your pharmacy.

## 2023-05-23 NOTE — Progress Notes (Signed)
Cardiology Office Note  Date: 05/23/2023   ID: Dominique Hinton, DOB 26-Oct-1970, MRN [id number]  History of Present Illness: Dominique Hinton is a 52 y.o. female last seen in March.  She is here for a follow-up visit.  Reports no nitroglycerin use in the interim.  Stable NYHA class II dyspnea.  No palpitations or syncope.  Does have intermittent sense of orthostatic lightheadedness.  She is following with pulmonary, saw Dr. Vassie Loll in August, I reviewed the note.  Not diagnosed formally with OSA.  I reviewed her medications.  We discussed addition of potassium supplement, she is on HCTZ, her potassium was 3.2 earlier in the year.  Does have occasional leg cramps.  She will see Dr. Ouida Sills for lab work next month.  Physical Exam: VS:  BP 110/76   Pulse 76   Ht 5\' 6"  (1.676 m)   Wt 181 lb (82.1 kg)   SpO2 95%   BMI 29.21 kg/m , BMI Body mass index is 29.21 kg/m.  Wt Readings from Last 3 Encounters:  05/23/23 181 lb (82.1 kg)  04/24/23 178 lb (80.7 kg)  02/08/23 177 lb 6.4 oz (80.5 kg)    General: Patient appears comfortable at rest. HEENT: Conjunctiva and lids normal. Neck: Supple, no elevated JVP or carotid bruits. Lungs: Clear to auscultation, nonlabored breathing at rest. Cardiac: Regular rate and rhythm, no S3 or significant systolic murmur. Extremities: No pitting edema.  ECG:  An ECG dated 11/13/2022 was personally reviewed today and demonstrated:  Sinus arrhythmia.  Labwork: February 2023: Cholesterol 117, triglycerides 252, HDL 29, LDL 48 05/25/2022: Hemoglobin 15.4; Platelets 293 09/08/2022: ALT 15; AST 15; BUN 5; Creatinine, Ser 0.85; Potassium 3.2; Sodium 144   Other Studies Reviewed Today:  No interval cardiac testing for review today.  Assessment and Plan:  1.  CAD status post angioplasty of the first diagonal in January 2022.  LVEF 55 to 60%.  No increasing angina or nitroglycerin use.  Plan to continue medical therapy which includes aspirin, Crestor, and as  needed nitroglycerin.   2.  Essential hypertension.  Blood pressure well-controlled today.  Does report intermittent sense of orthostatic lightheadedness.  I have asked her to check blood pressure at home and bring readings into her next PCP visit.  May need to consider reducing Cozaar if symptoms are associated with relative hypotension.  In the meanwhile adding KCl 10 mEq daily to HCTZ.   3.  Mixed hyperlipidemia, LDL 48 in February 2023.  Continue Crestor.  Disposition:  Follow up  6 months.  Signed, Jonelle Sidle, M.D., F.A.C.C. [facility]

## 2023-06-07 ENCOUNTER — Encounter: Payer: Self-pay | Admitting: Pulmonary Disease

## 2023-09-28 ENCOUNTER — Other Ambulatory Visit: Payer: Self-pay | Admitting: Adult Health

## 2023-10-05 ENCOUNTER — Encounter (INDEPENDENT_AMBULATORY_CARE_PROVIDER_SITE_OTHER): Payer: Self-pay | Admitting: Gastroenterology

## 2023-11-19 ENCOUNTER — Ambulatory Visit (INDEPENDENT_AMBULATORY_CARE_PROVIDER_SITE_OTHER): Payer: Medicaid Other | Admitting: Gastroenterology

## 2023-11-26 ENCOUNTER — Encounter (INDEPENDENT_AMBULATORY_CARE_PROVIDER_SITE_OTHER): Payer: Self-pay | Admitting: Gastroenterology

## 2023-11-26 ENCOUNTER — Ambulatory Visit (INDEPENDENT_AMBULATORY_CARE_PROVIDER_SITE_OTHER): Payer: Medicaid Other | Admitting: Gastroenterology

## 2023-11-26 VITALS — BP 110/78 | HR 84 | Temp 97.8°F | Ht 66.0 in | Wt 201.4 lb

## 2023-11-26 DIAGNOSIS — K3184 Gastroparesis: Secondary | ICD-10-CM

## 2023-11-26 DIAGNOSIS — K581 Irritable bowel syndrome with constipation: Secondary | ICD-10-CM | POA: Diagnosis not present

## 2023-11-26 MED ORDER — LUBIPROSTONE 8 MCG PO CAPS: 8.0000 ug | ORAL_CAPSULE | Freq: Two times a day (BID) | ORAL | 3 refills | Status: DC

## 2023-11-26 NOTE — Patient Instructions (Signed)
 Start Amitiza 8 mcg twice a day, stop taking Dulcolax Continue with Phenergan 25 mg as needed for bloating and/or nausea Can take dicyclomine as needed for abdominal pain

## 2023-11-26 NOTE — Progress Notes (Unsigned)
 Katrinka Blazing, M.D. Gastroenterology & Hepatology [facility] Gastroenterology [address] [place], [place] [address]  Primary Care Physician: Carylon Perches, MD [address] [place] [address]  I will communicate my assessment and recommendations to the referring MD via EMR.  Problems: Gastroparesis IBS-C   History of Present Illness: DARIONNA BANKE is a 53 y.o. female with past medical history of gastroparesis, anxiety, arthritis, back pain, CAD, HTN, NSTEMI (2022), IVC, who presents for follow up of gastroparesis and IBS-C.  The patient was last seen on 11/20/2022. At that time, the patient was continued on Phenergan 25 mg every 8 hours as needed for bloating and abdominal fullness, as well as dicyclomine as needed for abdominal pain.  She was started on MiraLAX for constipation episodes with breakthrough management with Dulcolax.  Patient did not try Miralax as she states "not liking mixing medications and water".  She is currently taking Dulcolax as needed for constipation. Has a BM every other day, depending on what she eats.  Has previously tried Linzess but had severe diarrhea.  She has some mild nausea and has abdominal bloating with discomfort. She takes Phenergan 25 mg as needed (not daily, possibly once a week), which relieves her abdominal symptoms.  The patient denies having any vomiting, fever, chills, hematochezia, melena, hematemesis, diarrhea, jaundice, pruritus or weight loss. She has actually gained weight.  Last EGD:05/18/2022 Normal esophagus, stomach and small bowel.   Path: A. SMALL BOWEL, BIOPSY:  - Small intestinal mucosa with no specific histopathologic changes  - Negative for increased intraepithelial lymphocytes or villous  architectural changes   B. STOMACH, BIOPSY:  - Gastric antral mucosa with mild nonspecific reactive gastropathy  - Gastric oxyntic mucosa with no specific histopathologic changes  -  Helicobacter pylori-like organisms are not identified on routine HE  stain    Last Colonoscopy: 10/25/2021 The perianal and digital rectal examinations were normal. There was a small lipoma, in the ascending colon. Localized areas of mildly congested and erythematous mucosa was found in the sigmoid colon (? SCAD). Biopsies were taken with a cold forceps for histology. The retroflexed view of the distal rectum and anal verge was normal and showed no anal or rectal abnormalities.   Path: FINAL MICROSCOPIC DIAGNOSIS:   A. COLON, RANDOM, SIGMOID, BIOPSY:      - Healed ulcer with dilated blood vessels.      - No evidence of microscopic colitis, active colitis, dysplasia or  malignancy.    Recommended repeat in 10 years  Past Medical History: Past Medical History:  Diagnosis Date   Anxiety    Arthritis    Back pain    CAD (coronary artery disease)    Angioplasty of D1 January 2022   Essential hypertension    Fibromyalgia    per patient   NSTEMI (non-ST elevated myocardial infarction) [facility])    January 2022    Past Surgical History: Past Surgical History:  Procedure Laterality Date   ABDOMINAL HYSTERECTOMY     APPENDECTOMY     BIOPSY  10/25/2021   Procedure: BIOPSY;  Surgeon: Dolores Frame, MD;  [facility];  Service: Gastroenterology;;   BIOPSY  05/18/2022   Procedure: BIOPSY;  Surgeon: Dolores Frame, MD;  [facility];  Service: Gastroenterology;;   BREAST SURGERY     CARDIAC CATHETERIZATION  09/21/2020   CHOLECYSTECTOMY     COLONOSCOPY WITH PROPOFOL N/A 10/25/2021   Procedure: COLONOSCOPY WITH PROPOFOL;  Surgeon: Marguerita Merles,  Reuel Boom, MD;  [facility];  Service: Gastroenterology;  Laterality: N/A;  105   CORONARY BALLOON ANGIOPLASTY  09/21/2020   CORONARY/GRAFT ACUTE MI REVASCULARIZATION N/A 09/21/2020   Procedure: Coronary/Graft Acute MI Revascularization;  Surgeon: Corky Crafts, MD;  Location: [facility]  INVASIVE CV LAB;  Service: Cardiovascular;  Laterality: N/A;   ESOPHAGOGASTRODUODENOSCOPY (EGD) WITH PROPOFOL N/A 05/18/2022   Procedure: ESOPHAGOGASTRODUODENOSCOPY (EGD) WITH PROPOFOL;  Surgeon: Dolores Frame, MD;  [facility];  Service: Gastroenterology;  Laterality: N/A;  930 ASA 3   LEFT HEART CATH AND CORONARY ANGIOGRAPHY N/A 09/21/2020   Procedure: LEFT HEART CATH AND CORONARY ANGIOGRAPHY;  Surgeon: Corky Crafts, MD;  Location: [facility] INVASIVE CV LAB;  Service: Cardiovascular;  Laterality: N/A;   TUBAL LIGATION      Family History: Family History  Problem Relation Age of Onset   Diabetes Mother    Hypertension Mother    Dementia Mother    Thyroid disease Mother    Alzheimer's disease Father    Thyroid disease Brother    Anxiety disorder Daughter    Depression Daughter    Depression Daughter    Anxiety disorder Daughter    Depression Son    Anxiety disorder Son        social anxiety   ODD Son    ADD / ADHD Son     Social History: Social History   Tobacco Use  Smoking Status Every Day   Current packs/day: 1.00   Average packs/day: 1 pack/day for 30.0 years (30.0 ttl pk-yrs)   Types: Cigarettes   Passive exposure: Current  Smokeless Tobacco Never  Tobacco Comments   Started smoking at age 69   Social History   Substance and Sexual Activity  Alcohol Use Yes   Comment: occasionally   Social History   Substance and Sexual Activity  Drug Use Yes   Types: Marijuana   Comment: occ    Allergies: Allergies  Allergen Reactions   Cortisone Shortness Of Breath and Other (See Comments)    Made me feel loopy   Doxycycline Rash    Medications: Current Outpatient Medications  Medication Sig Dispense Refill   aspirin 81 MG chewable tablet Chew 1 tablet (81 mg total) by mouth daily. 90 tablet 1   bisacodyl (DULCOLAX) 5 MG EC tablet Take 5 mg by mouth daily as needed for moderate constipation.     cloNIDine (CATAPRES) 0.2 MG tablet Take  0.2 mg by mouth. One at bedtime     Fexofenadine HCl (ALLEGRA PO) Take 180 mg by mouth daily.     fluticasone (FLONASE) 50 MCG/ACT nasal spray Place into both nostrils daily.     gabapentin (NEURONTIN) 400 MG capsule Take 400 mg by mouth 2 (two) times daily.     hydrochlorothiazide (HYDRODIURIL) 25 MG tablet Take 1 tablet (25 mg total) by mouth daily. 90 tablet 3   hypromellose (SYSTANE OVERNIGHT THERAPY) 0.3 % GEL ophthalmic ointment Place 1 Application into both eyes at bedtime.     losartan (COZAAR) 50 MG tablet TAKE 1 TABLET(50 MG) BY MOUTH DAILY 90 tablet 3   nitroGLYCERIN (NITROSTAT) 0.4 MG SL tablet PLACE 1 TABLET (0.4 MG TOTAL) UNDER THE TONGUE EVERY FIVE MINUTES AS NEEDED FOR CHEST PAIN. 25 tablet 2   OVER THE COUNTER MEDICATION Take 1 capsule by mouth in the morning and at bedtime. Women's Natural Transition     oxyCODONE-acetaminophen (PERCOCET/ROXICET) 5-325 MG tablet Take by mouth every 8 (eight) hours as needed for  severe pain.     potassium chloride (KLOR-CON) 10 MEQ tablet Take 1 tablet (10 mEq total) by mouth daily. 90 tablet 3   promethazine (PHENERGAN) 25 MG tablet TAKE 1 TABLET(25 MG) BY MOUTH EVERY 8 HOURS AS NEEDED FOR NAUSEA OR VOMITING 30 tablet 1   REXULTI 1 MG TABS tablet Take 2 mg by mouth at bedtime.     rosuvastatin (CRESTOR) 20 MG tablet TAKE 1 TABLET BY MOUTH EVERY DAY AT 6 PM 90 tablet 3   tiZANidine (ZANAFLEX) 2 MG tablet Take 2 mg by mouth 2 (two) times daily as needed. Take 4 mg by mouth in the day and take 8 mg at bedtime     traZODone (DESYREL) 150 MG tablet Take 150 mg by mouth at bedtime. 1-2 at bedtime.     UNABLE TO FIND Take 1,500 mg by mouth daily. Soy capsules     valACYclovir (VALTREX) 1000 MG tablet Take 1,000 mg by mouth daily.     VALIUM 5 MG tablet Take 5 mg by mouth every 12 (twelve) hours as needed.     VENTOLIN HFA 108 (90 Base) MCG/ACT inhaler SMARTSIG:2 Puff(s) By Mouth Every 4 Hours PRN     Vilazodone HCl (VIIBRYD) 40 MG TABS Take 40 mg by  mouth every morning.     Vitamin D, Ergocalciferol, (DRISDOL) 1.25 MG (50000 UNIT) CAPS capsule Take 50,000 Units by mouth once a week.     dicyclomine (BENTYL) 10 MG capsule TAKE 1 CAPSULE(10 MG) BY MOUTH TWICE DAILY AS NEEDED FOR SPASMS (Patient not taking: Reported on 11/26/2023) 90 capsule 3   No current facility-administered medications for this visit.    Review of Systems: GENERAL: negative for malaise, night sweats HEENT: No changes in hearing or vision, no nose bleeds or other nasal problems. NECK: Negative for lumps, goiter, pain and significant neck swelling RESPIRATORY: Negative for cough, wheezing CARDIOVASCULAR: Negative for chest pain, leg swelling, palpitations, orthopnea GI: SEE HPI MUSCULOSKELETAL: Negative for joint pain or swelling, back pain, and muscle pain. SKIN: Negative for lesions, rash PSYCH: Negative for sleep disturbance, mood disorder and recent psychosocial stressors. HEMATOLOGY Negative for prolonged bleeding, bruising easily, and swollen nodes. ENDOCRINE: Negative for cold or heat intolerance, polyuria, polydipsia and goiter. NEURO: negative for tremor, gait imbalance, syncope and seizures. The remainder of the review of systems is noncontributory.   Physical Exam: BP 110/78 (BP Location: Left Arm, Patient Position: Sitting, Cuff Size: Large)   Pulse 84   Temp 97.8 F (36.6 C) (Temporal)   Ht 5\' 6"  (1.676 m)   Wt 201 lb 6.4 oz (91.4 kg)   BMI 32.51 kg/m  GENERAL: The patient is AO x3, in no acute distress. HEENT: Head is normocephalic and atraumatic. EOMI are intact. Mouth is well hydrated and without lesions. NECK: Supple. No masses LUNGS: Clear to auscultation. No presence of rhonchi/wheezing/rales. Adequate chest expansion HEART: RRR, normal s1 and s2. ABDOMEN: Soft, nontender, no guarding, no peritoneal signs, and nondistended. BS +. No masses. RECTAL EXAM: no external lesions, normal tone, no masses, brown stool without blood.***  Chaperone: EXTREMITIES: Without any cyanosis, clubbing, rash, lesions or edema. NEUROLOGIC: AOx3, no focal motor deficit. SKIN: no jaundice, no rashes  Imaging/Labs: as above  I personally reviewed and interpreted the available labs, imaging and endoscopic files.  Impression and Plan: HANIFA ANTONETTI is a 53 y.o. female coming for follow up of ***   All questions were answered.      Katrinka Blazing, MD Gastroenterology and  Hepatology [facility] Gastroenterology

## 2023-11-29 ENCOUNTER — Ambulatory Visit (INDEPENDENT_AMBULATORY_CARE_PROVIDER_SITE_OTHER): Payer: Medicaid Other | Admitting: Gastroenterology

## 2024-01-10 ENCOUNTER — Encounter: Payer: Self-pay | Admitting: Cardiology

## 2024-01-10 ENCOUNTER — Other Ambulatory Visit (HOSPITAL_COMMUNITY): Payer: Self-pay | Admitting: Adult Health

## 2024-01-10 ENCOUNTER — Ambulatory Visit: Attending: Cardiology | Admitting: Cardiology

## 2024-01-10 VITALS — BP 112/82 | HR 76 | Ht 66.0 in | Wt 202.0 lb

## 2024-01-10 DIAGNOSIS — E782 Mixed hyperlipidemia: Secondary | ICD-10-CM | POA: Insufficient documentation

## 2024-01-10 DIAGNOSIS — I10 Essential (primary) hypertension: Secondary | ICD-10-CM | POA: Insufficient documentation

## 2024-01-10 DIAGNOSIS — Z1231 Encounter for screening mammogram for malignant neoplasm of breast: Secondary | ICD-10-CM

## 2024-01-10 DIAGNOSIS — I25119 Atherosclerotic heart disease of native coronary artery with unspecified angina pectoris: Secondary | ICD-10-CM | POA: Diagnosis present

## 2024-01-10 MED ORDER — LOSARTAN POTASSIUM 25 MG PO TABS: 25.0000 mg | ORAL_TABLET | Freq: Every day | ORAL | 3 refills | Status: DC

## 2024-01-10 NOTE — Progress Notes (Signed)
    Cardiology Office Note  Date: 01/10/2024   ID: KHUSHBU BOWLDS, DOB [birth year], MRN [id number]  History of Present Illness: Dominique Hinton is a 53 y.o. female last seen in September 2024.  She is here today for a routine visit.  She states that she had an episode of chest discomfort and used a single nitroglycerin  in the interim, she was not sure whether it was related to anxiety or not.  This has not been a typical pattern and she is only used nitroglycerin  on one occasion.  She does describe dependent lower leg and ankle edema.  No orthopnea or PND.  We went over her medications.  Reports compliance with her current regimen.  Still describes intermittent orthostatic lightheadedness and tends to run a low normal blood pressure.  She is on clonidine for treatment of hot flashes along with Cozaar  and HCTZ.  We discussed reducing the dose of Cozaar  to 25 mg daily for now.  She will continue to track blood pressure.  I reviewed her ECG today which shows normal sinus rhythm.  She is due for follow-up with Dr. Hildy Lowers for physical and lab work.  Physical Exam: VS:  BP 112/82   Pulse 76   Ht 5\' 6"  (1.676 m)   Wt 202 lb (91.6 kg)   SpO2 98%   BMI 32.60 kg/m , BMI Body mass index is 32.6 kg/m.  Wt Readings from Last 3 Encounters:  01/10/24 202 lb (91.6 kg)  11/26/23 201 lb 6.4 oz (91.4 kg)  05/23/23 181 lb (82.1 kg)    General: Patient appears comfortable at rest. HEENT: Conjunctiva and lids normal. Neck: Supple, no elevated JVP or carotid bruits. Lungs: Clear to auscultation, nonlabored breathing at rest. Cardiac: Regular rate and rhythm, no S3 or significant systolic murmur. Extremities: Mild ankle edema.  ECG:  An ECG dated 11/13/2022 was personally reviewed today and demonstrated:  Sinus arrhythmia.  Labwork:  February 2023: Cholesterol 117, triglycerides 252, HDL 29, LDL 48 January 2024: BUN 5, creatinine 0.85, GFR 83, potassium 3.2, AST 15, ALT 15  Other Studies  Reviewed Today:  No interval cardiac testing for review today.  Assessment and Plan:  1.  CAD status post angioplasty of the first diagonal in January 2022.  LVEF 55 to 60% at that time.  She reports a single use of nitroglycerin  in the interim.  Also some dependent leg edema.  We will plan to update echocardiogram to ensure no change in LVEF.  Continue medical therapy including aspirin  81 mg daily, Crestor  20 mg daily, and as needed nitroglycerin .   2.  Primary hypertension.  Low normal blood pressure with intermittent sense of orthostatic dizziness.  She is on clonidine for treatment of hot flashes.  Plan to reduce Cozaar  to 25 mg daily and continue HCTZ 25 mg daily with potassium supplement.  Keep follow-up with Dr. Hildy Lowers for repeat physical and lab work.   3.  Mixed hyperlipidemia.  Continue Crestor  20 mg daily.  She is due for follow-up lab work with Dr. Hildy Lowers.  Her last LDL was 48 in February 2023.  Disposition:  Follow up 6 months.  Signed, Gerard Knight, M.D., F.A.C.C. [facility]

## 2024-01-10 NOTE — Patient Instructions (Signed)
 Medication Instructions:   DECREASE Losartan  to 25 mg daily  Labwork: None today  Testing/Procedures: Your physician has requested that you have an echocardiogram. Echocardiography is a painless test that uses sound waves to create images of your heart. It provides your doctor with information about the size and shape of your heart and how well your heart's chambers and valves are working. This procedure takes approximately one hour. There are no restrictions for this procedure. Please do NOT wear cologne, perfume, aftershave, or lotions (deodorant is allowed). Please arrive 15 minutes prior to your appointment time.  Please note: We ask at that you not bring children with you during ultrasound (echo/ vascular) testing. Due to room size and safety concerns, children are not allowed in the ultrasound rooms during exams. Our front office staff cannot provide observation of children in our lobby area while testing is being conducted. An adult accompanying a patient to their appointment will only be allowed in the ultrasound room at the discretion of the ultrasound technician under special circumstances. We apologize for any inconvenience.   Follow-Up: 6 months  Any Other Special Instructions Will Be Listed Below (If Applicable).  If you need a refill on your cardiac medications before your next appointment, please call your pharmacy.

## 2024-01-16 ENCOUNTER — Encounter (HOSPITAL_COMMUNITY): Payer: Self-pay

## 2024-01-16 ENCOUNTER — Ambulatory Visit (HOSPITAL_COMMUNITY)
Admission: RE | Admit: 2024-01-16 | Discharge: 2024-01-16 | Disposition: A | Discharge status: Home / Self Care | Source: Ambulatory Visit | Attending: Adult Health | Admitting: Adult Health

## 2024-01-16 DIAGNOSIS — Z1231 Encounter for screening mammogram for malignant neoplasm of breast: Secondary | ICD-10-CM | POA: Diagnosis present

## 2024-01-18 ENCOUNTER — Ambulatory Visit: Payer: Self-pay | Admitting: Adult Health

## 2024-01-23 ENCOUNTER — Other Ambulatory Visit: Payer: Self-pay | Admitting: *Deleted

## 2024-01-23 MED ORDER — HYDROCHLOROTHIAZIDE 25 MG PO TABS: 25.0000 mg | ORAL_TABLET | Freq: Every day | ORAL | 3 refills | Status: DC

## 2024-02-11 ENCOUNTER — Ambulatory Visit: Payer: Self-pay | Admitting: Cardiology

## 2024-02-11 ENCOUNTER — Ambulatory Visit (HOSPITAL_COMMUNITY)
Admission: RE | Admit: 2024-02-11 | Discharge: 2024-02-11 | Disposition: A | Discharge status: Home / Self Care | Source: Ambulatory Visit | Attending: Cardiology | Admitting: Cardiology

## 2024-02-11 DIAGNOSIS — I25119 Atherosclerotic heart disease of native coronary artery with unspecified angina pectoris: Secondary | ICD-10-CM | POA: Insufficient documentation

## 2024-02-11 LAB — ECHOCARDIOGRAM COMPLETE
Area-P 1/2: 3.48 cm2
S' Lateral: 2.6 cm

## 2024-02-11 NOTE — Progress Notes (Signed)
*  PRELIMINARY RESULTS* Echocardiogram 2D Echocardiogram has been performed.  Bernis Brisker 02/11/2024, 9:16 AM

## 2024-03-12 ENCOUNTER — Other Ambulatory Visit: Payer: Self-pay | Admitting: Cardiology

## 2024-03-28 ENCOUNTER — Ambulatory Visit (HOSPITAL_COMMUNITY)
Admission: RE | Admit: 2024-03-28 | Discharge: 2024-03-28 | Disposition: A | Discharge status: Home / Self Care | Source: Ambulatory Visit | Attending: Cardiology | Admitting: Cardiology

## 2024-03-28 DIAGNOSIS — Z122 Encounter for screening for malignant neoplasm of respiratory organs: Secondary | ICD-10-CM | POA: Insufficient documentation

## 2024-03-28 DIAGNOSIS — F17210 Nicotine dependence, cigarettes, uncomplicated: Secondary | ICD-10-CM | POA: Insufficient documentation

## 2024-03-28 DIAGNOSIS — Z87891 Personal history of nicotine dependence: Secondary | ICD-10-CM | POA: Insufficient documentation

## 2024-04-07 ENCOUNTER — Other Ambulatory Visit: Payer: Self-pay

## 2024-04-07 DIAGNOSIS — F17210 Nicotine dependence, cigarettes, uncomplicated: Secondary | ICD-10-CM

## 2024-04-07 DIAGNOSIS — Z87891 Personal history of nicotine dependence: Secondary | ICD-10-CM

## 2024-04-07 DIAGNOSIS — Z122 Encounter for screening for malignant neoplasm of respiratory organs: Secondary | ICD-10-CM

## 2024-05-07 ENCOUNTER — Encounter (INDEPENDENT_AMBULATORY_CARE_PROVIDER_SITE_OTHER): Payer: Self-pay | Admitting: Gastroenterology

## 2024-06-18 ENCOUNTER — Encounter (INDEPENDENT_AMBULATORY_CARE_PROVIDER_SITE_OTHER): Payer: Self-pay | Admitting: Gastroenterology

## 2024-07-21 ENCOUNTER — Other Ambulatory Visit: Payer: Self-pay | Admitting: Cardiology

## 2024-09-26 ENCOUNTER — Ambulatory Visit: Attending: Student | Admitting: Student

## 2024-09-26 ENCOUNTER — Encounter: Payer: Self-pay | Admitting: Student

## 2024-09-26 VITALS — BP 110/60 | HR 78 | Ht 66.0 in | Wt 201.6 lb

## 2024-09-26 DIAGNOSIS — I10 Essential (primary) hypertension: Secondary | ICD-10-CM | POA: Diagnosis present

## 2024-09-26 DIAGNOSIS — I2510 Atherosclerotic heart disease of native coronary artery without angina pectoris: Secondary | ICD-10-CM | POA: Insufficient documentation

## 2024-09-26 DIAGNOSIS — E785 Hyperlipidemia, unspecified: Secondary | ICD-10-CM | POA: Diagnosis present

## 2024-09-26 DIAGNOSIS — Z720 Tobacco use: Secondary | ICD-10-CM | POA: Insufficient documentation

## 2024-09-26 MED ORDER — ROSUVASTATIN CALCIUM 20 MG PO TABS: 20.0000 mg | ORAL_TABLET | Freq: Every day | ORAL | 3 refills | Status: AC

## 2024-09-26 MED ORDER — LOSARTAN POTASSIUM 25 MG PO TABS: 25.0000 mg | ORAL_TABLET | Freq: Every day | ORAL | 3 refills | Status: AC

## 2024-09-26 MED ORDER — NITROGLYCERIN 0.4 MG SL SUBL: 0.4000 mg | SUBLINGUAL_TABLET | SUBLINGUAL | 3 refills | Status: AC | PRN

## 2024-09-26 MED ORDER — HYDROCHLOROTHIAZIDE 25 MG PO TABS: 25.0000 mg | ORAL_TABLET | Freq: Every day | ORAL | 3 refills | Status: AC

## 2024-09-26 NOTE — Patient Instructions (Signed)
 Medication Instructions:  Your physician recommends that you continue on your current medications as directed. Please refer to the Current Medication list given to you today.  *If you need a refill on your cardiac medications before your next appointment, please call your pharmacy*  Lab Work: NONE   If you have labs (blood work) drawn today and your tests are completely normal, you will receive your results only by: MyChart Message (if you have MyChart) OR A paper copy in the mail If you have any lab test that is abnormal or we need to change your treatment, we will call you to review the results.  Testing/Procedures: NONE   Follow-Up: At [facility], you and your health needs are our priority.  As part of our continuing mission to provide you with exceptional heart care, our providers are all part of one team.  This team includes your primary Cardiologist (physician) and Advanced Practice Providers or APPs (Physician Assistants and Nurse Practitioners) who all work together to provide you with the care you need, when you need it.  Your next appointment:   1 year(s)  Provider:   You may see Teddie Favre, MD or one of the following Advanced Practice Providers on your designated Care Team:   Woodfin Hays, PA-C  [place], [place] Theotis Flake, [place]     We recommend signing up for the patient portal called "MyChart".  Sign up information is provided on this After Visit Summary.  MyChart is used to connect with patients for Virtual Visits (Telemedicine).  Patients are able to view lab/test results, encounter notes, upcoming appointments, etc.  Non-urgent messages can be sent to your provider as well.   To learn more about what you can do with MyChart, go to [email or web address].   Other Instructions Thank you for choosing [facility]!

## 2024-09-26 NOTE — Progress Notes (Signed)
 "  Cardiology Office Note    Date:  09/26/2024  ID:  Dominique Hinton, DOB 1971/03/12, MRN [id number] Cardiologist: Dominique Sierras, MD {  History of Present Illness:    Dominique Hinton is a 53 y.o. female with past medical history of CAD (prior balloon angioplasty to D1 in 09/2020 with residual 75% stenosis along first marginal but medical management recommended given vessel size), HTN and HLD who presents to the office today for 69-month follow-up.  She was last examined by Dr. Sierras in 01/2024 and reported one episode of chest pain in the past 6 months which had resolved with nitroglycerin  but denied any recurrent symptoms. Reported intermittent orthostatic lightheadedness and had been on Clonidine for treatment of hot flashes. Given her orthostasis and soft BP, Losartan  was reduced to 25 mg daily and she was continued on HCTZ 25 mg daily along with ASA 81 mg daily and Crestor  20 mg daily.  In talking with the patient today, she reports overall doing well from a cardiac perspective since her last office visit. She reports only 1 episode of chest pain since her last visit and is unsure if her pain was due to a cardiac etiology or anxiety at that time. She has been under increased stress as she is the primary caregiver for her mother. She reports her breathing has been stable and denies any specific dyspnea on exertion, orthopnea or PND. No recurrent dizziness. Still smoking approximately 1 pack/day. She still takes Clonidine for hot flashes and has not been interested in trying to wean off of this given how severe her symptoms were at that time. Overall tolerates well with no side effects at this time.   Studies Reviewed:   EKG: EKG is not ordered today.   Cardiac Catheterization: 09/2020 1st Mrg lesion is 75% stenosed. This is a very small vessel. Lat 1st Diag lesion is 100% stenosed. Given the wall motion abnormality on ventriculogram, we decided to intervene. Balloon angioplasty was  performed using a BALLOON SAPPHIRE 1.25X10. It turned out to be a small vessel. Post intervention, there is a 25% residual stenosis. The vessel was too small for stenting. The left ventricular systolic function is normal. LV end diastolic pressure is normal. The left ventricular ejection fraction is 50-55% by visual estimate. There is no aortic valve stenosis.   Continue aggressive medical therapy with DAPT.  Could change Brilinta  to Plavix  since no stent was placed.    She needs to stop smoking.  Healthy diet, BP and blood sugar control would also be helpful.   Echocardiogram: 02/2024 IMPRESSIONS     1. Left ventricular ejection fraction, by estimation, is 55 to 60%. The  left ventricle has normal function. The left ventricle has no regional  wall motion abnormalities. Left ventricular diastolic parameters were  normal.   2. Right ventricular systolic function is normal. The right ventricular  size is normal. Tricuspid regurgitation signal is inadequate for assessing  PA pressure.   3. The mitral valve is degenerative. Trivial mitral valve regurgitation.   4. The aortic valve is tricuspid. Aortic valve regurgitation is not  visualized. No aortic stenosis is present.   5. The inferior vena cava is normal in size with greater than 50%  respiratory variability, suggesting right atrial pressure of 3 mmHg.   Comparison(s): Prior images reviewed side by side. LVEF normal range at  55-60%.    Physical Exam:   VS:  BP 110/60 (BP Location: Left Arm, Cuff Size: Normal)   Pulse 78  Ht 5' 6 (1.676 m)   Wt 201 lb 9.6 oz (91.4 kg)   SpO2 94%   BMI 32.54 kg/m    Wt Readings from Last 3 Encounters:  09/26/24 201 lb 9.6 oz (91.4 kg)  01/10/24 202 lb (91.6 kg)  11/26/23 201 lb 6.4 oz (91.4 kg)     GEN: Well nourished, well developed female appearing  in no acute distress NECK: No JVD; No carotid bruits CARDIAC: RRR, no murmurs, rubs, gallops RESPIRATORY:  Clear to auscultation  without rales, wheezing or rhonchi  ABDOMEN: Appears non-distended. No obvious abdominal masses. EXTREMITIES: No clubbing or cyanosis. No pitting edema.  Distal pedal pulses are 2+ bilaterally.   Assessment and Plan:   1. Coronary artery disease involving native coronary artery of native heart without angina pectoris - She previously underwent angioplasty to D1 in 09/2020 and did have 75% stenosis along a small marginal branch with medical management recommended. She denies any recent anginal symptoms.  - Continue current medical therapy with ASA 81 mg daily, Losartan  25 mg daily, Crestor  20 mg daily and PRN SL NTG.  2. Essential hypertension - Her BP is well-controlled at 110/60 during today's visit. Continue HCTZ 25 mg daily and Losartan  25 mg daily. She is also on Clonidine 0.2 mg daily but takes this due to hot flashes. Creatinine was stable at 0.95 with K+ at 3.5 when checked in 05/2024.  3. Hyperlipidemia LDL goal <70 - By review of [facility] DXA, FLP in 02/2024 showed total cholesterol 155, triglycerides 340, HDL 31 and LDL 70. Continue current medical therapy with Crestor  20 mg daily.   4. Tobacco Use - She is still smoking 1 ppd. Reduction and cessation advised but she reports she has not been able to reduce her use given her current stressors.    Disposition: She prefers to follow-up in 1 year unless she develops any new symptoms in the interim.   Signed, Laymon CHRISTELLA Qua, PA-C   "
